# Patient Record
Sex: Male | Born: 1945
Health system: Southern US, Community
[De-identification: ages and names within clinical notes are randomized; demographics above are authoritative.]

## PROBLEM LIST (undated history)

## (undated) DIAGNOSIS — I2699 Other pulmonary embolism without acute cor pulmonale: Secondary | ICD-10-CM

## (undated) DIAGNOSIS — Z87438 Personal history of other diseases of male genital organs: Secondary | ICD-10-CM

## (undated) DIAGNOSIS — R011 Cardiac murmur, unspecified: Secondary | ICD-10-CM

## (undated) DIAGNOSIS — K635 Polyp of colon: Secondary | ICD-10-CM

## (undated) DIAGNOSIS — Z973 Presence of spectacles and contact lenses: Secondary | ICD-10-CM

## (undated) DIAGNOSIS — J189 Pneumonia, unspecified organism: Secondary | ICD-10-CM

## (undated) DIAGNOSIS — I4892 Unspecified atrial flutter: Secondary | ICD-10-CM

## (undated) DIAGNOSIS — E785 Hyperlipidemia, unspecified: Secondary | ICD-10-CM

## (undated) DIAGNOSIS — C61 Malignant neoplasm of prostate: Secondary | ICD-10-CM

## (undated) DIAGNOSIS — I82409 Acute embolism and thrombosis of unspecified deep veins of unspecified lower extremity: Secondary | ICD-10-CM

## (undated) DIAGNOSIS — M1990 Unspecified osteoarthritis, unspecified site: Secondary | ICD-10-CM

## (undated) DIAGNOSIS — I7789 Other specified disorders of arteries and arterioles: Secondary | ICD-10-CM

## (undated) DIAGNOSIS — I499 Cardiac arrhythmia, unspecified: Secondary | ICD-10-CM

## (undated) DIAGNOSIS — I4819 Other persistent atrial fibrillation: Secondary | ICD-10-CM

## (undated) DIAGNOSIS — I517 Cardiomegaly: Secondary | ICD-10-CM

## (undated) HISTORY — DX: Other pulmonary embolism without acute cor pulmonale: I26.99

## (undated) HISTORY — PX: TONSILLECTOMY: SUR1361

## (undated) HISTORY — DX: Unspecified osteoarthritis, unspecified site: M19.90

## (undated) HISTORY — DX: Acute embolism and thrombosis of unspecified deep veins of unspecified lower extremity: I82.409

## (undated) HISTORY — DX: Cardiomegaly: I51.7

## (undated) HISTORY — DX: Unspecified atrial flutter: I48.92

## (undated) HISTORY — PX: KNEE SURGERY: SHX244

## (undated) HISTORY — PX: QUADRICEPS REPAIR: SHX2281

## (undated) HISTORY — DX: Personal history of other diseases of male genital organs: Z87.438

## (undated) HISTORY — DX: Other specified disorders of arteries and arterioles: I77.89

## (undated) HISTORY — DX: Hyperlipidemia, unspecified: E78.5

## (undated) HISTORY — DX: Polyp of colon: K63.5

## (undated) HISTORY — PX: FRACTURE SURGERY: SHX138

## (undated) HISTORY — DX: Other persistent atrial fibrillation: I48.19

---

## 1998-03-31 ENCOUNTER — Ambulatory Visit (HOSPITAL_COMMUNITY): Admission: RE | Admit: 1998-03-31 | Discharge: 1998-03-31 | Payer: Self-pay | Admitting: *Deleted

## 1998-04-19 ENCOUNTER — Encounter: Payer: Self-pay | Admitting: Orthopedic Surgery

## 1998-04-19 ENCOUNTER — Emergency Department (HOSPITAL_COMMUNITY): Admission: EM | Admit: 1998-04-19 | Discharge: 1998-04-19 | Payer: Self-pay | Admitting: Emergency Medicine

## 2001-03-08 ENCOUNTER — Ambulatory Visit (HOSPITAL_COMMUNITY): Admission: RE | Admit: 2001-03-08 | Discharge: 2001-03-08 | Payer: Self-pay | Admitting: *Deleted

## 2001-03-08 ENCOUNTER — Encounter (INDEPENDENT_AMBULATORY_CARE_PROVIDER_SITE_OTHER): Payer: Self-pay | Admitting: *Deleted

## 2004-03-25 ENCOUNTER — Ambulatory Visit (HOSPITAL_COMMUNITY): Admission: RE | Admit: 2004-03-25 | Discharge: 2004-03-25 | Payer: Self-pay | Admitting: *Deleted

## 2004-03-25 ENCOUNTER — Encounter (INDEPENDENT_AMBULATORY_CARE_PROVIDER_SITE_OTHER): Payer: Self-pay | Admitting: *Deleted

## 2008-08-08 ENCOUNTER — Encounter: Payer: Self-pay | Admitting: Cardiology

## 2008-09-19 ENCOUNTER — Encounter: Payer: Self-pay | Admitting: Internal Medicine

## 2008-09-25 ENCOUNTER — Ambulatory Visit: Payer: Self-pay | Admitting: Internal Medicine

## 2008-09-25 DIAGNOSIS — D126 Benign neoplasm of colon, unspecified: Secondary | ICD-10-CM

## 2008-09-25 DIAGNOSIS — Z87898 Personal history of other specified conditions: Secondary | ICD-10-CM

## 2008-09-25 DIAGNOSIS — E785 Hyperlipidemia, unspecified: Secondary | ICD-10-CM | POA: Insufficient documentation

## 2008-09-25 DIAGNOSIS — M109 Gout, unspecified: Secondary | ICD-10-CM

## 2008-09-29 ENCOUNTER — Encounter: Payer: Self-pay | Admitting: Internal Medicine

## 2008-10-06 ENCOUNTER — Encounter: Payer: Self-pay | Admitting: Internal Medicine

## 2008-10-09 ENCOUNTER — Ambulatory Visit: Payer: Self-pay

## 2008-10-09 ENCOUNTER — Encounter: Payer: Self-pay | Admitting: Internal Medicine

## 2008-10-13 ENCOUNTER — Encounter: Payer: Self-pay | Admitting: Internal Medicine

## 2008-10-15 ENCOUNTER — Telehealth: Payer: Self-pay | Admitting: Internal Medicine

## 2008-10-22 ENCOUNTER — Encounter: Payer: Self-pay | Admitting: Internal Medicine

## 2008-11-03 ENCOUNTER — Encounter: Payer: Self-pay | Admitting: Internal Medicine

## 2008-11-06 ENCOUNTER — Ambulatory Visit: Payer: Self-pay | Admitting: Internal Medicine

## 2008-11-11 ENCOUNTER — Encounter: Payer: Self-pay | Admitting: Internal Medicine

## 2008-11-18 ENCOUNTER — Encounter: Payer: Self-pay | Admitting: Internal Medicine

## 2008-11-25 ENCOUNTER — Encounter: Payer: Self-pay | Admitting: Internal Medicine

## 2008-12-02 ENCOUNTER — Encounter: Payer: Self-pay | Admitting: Internal Medicine

## 2008-12-09 ENCOUNTER — Encounter: Payer: Self-pay | Admitting: Internal Medicine

## 2008-12-09 ENCOUNTER — Telehealth: Payer: Self-pay | Admitting: Internal Medicine

## 2008-12-10 ENCOUNTER — Telehealth: Payer: Self-pay | Admitting: Internal Medicine

## 2008-12-12 ENCOUNTER — Ambulatory Visit: Payer: Self-pay | Admitting: Internal Medicine

## 2008-12-12 DIAGNOSIS — I4821 Permanent atrial fibrillation: Secondary | ICD-10-CM | POA: Insufficient documentation

## 2008-12-12 LAB — CONVERTED CEMR LAB
BUN: 19 mg/dL (ref 6–23)
Basophils Absolute: 0 10*3/uL (ref 0.0–0.1)
Basophils Relative: 0.2 % (ref 0.0–3.0)
CO2: 28 meq/L (ref 19–32)
Calcium: 9.4 mg/dL (ref 8.4–10.5)
Chloride: 107 meq/L (ref 96–112)
Creatinine, Ser: 0.9 mg/dL (ref 0.4–1.5)
Eosinophils Absolute: 0.2 10*3/uL (ref 0.0–0.7)
Eosinophils Relative: 2.8 % (ref 0.0–5.0)
GFR calc non Af Amer: 90.62 mL/min (ref >60)
Glucose, Bld: 77 mg/dL (ref 70–99)
HCT: 41.8 % (ref 39.0–52.0)
Hemoglobin: 14.4 g/dL (ref 13.0–17.0)
INR: 2.2 — ABNORMAL HIGH (ref 0.8–1.0)
Lymphocytes Relative: 31.7 % (ref 12.0–46.0)
Lymphs Abs: 1.9 10*3/uL (ref 0.7–4.0)
MCHC: 34.4 g/dL (ref 30.0–36.0)
MCV: 102.5 fL — ABNORMAL HIGH (ref 78.0–100.0)
Magnesium: 2.1 mg/dL (ref 1.5–2.5)
Monocytes Absolute: 0.6 10*3/uL (ref 0.1–1.0)
Monocytes Relative: 9.5 % (ref 3.0–12.0)
Neutro Abs: 3.4 10*3/uL (ref 1.4–7.7)
Neutrophils Relative %: 55.8 % (ref 43.0–77.0)
Platelets: 201 10*3/uL (ref 150.0–400.0)
Potassium: 4.3 meq/L (ref 3.5–5.1)
Prothrombin Time: 22.2 s — ABNORMAL HIGH (ref 9.1–11.7)
RBC: 4.07 M/uL — ABNORMAL LOW (ref 4.22–5.81)
RDW: 12.2 % (ref 11.5–14.6)
Sodium: 140 meq/L (ref 135–145)
WBC: 6.1 10*3/uL (ref 4.5–10.5)
aPTT: 34.7 s — ABNORMAL HIGH (ref 21.7–28.8)

## 2008-12-16 ENCOUNTER — Ambulatory Visit (HOSPITAL_COMMUNITY): Admission: RE | Admit: 2008-12-16 | Discharge: 2008-12-16 | Payer: Self-pay | Admitting: Internal Medicine

## 2008-12-16 ENCOUNTER — Ambulatory Visit: Payer: Self-pay | Admitting: Internal Medicine

## 2008-12-19 ENCOUNTER — Telehealth: Payer: Self-pay | Admitting: Internal Medicine

## 2009-01-06 ENCOUNTER — Encounter (INDEPENDENT_AMBULATORY_CARE_PROVIDER_SITE_OTHER): Payer: Self-pay | Admitting: *Deleted

## 2009-01-07 ENCOUNTER — Ambulatory Visit: Payer: Self-pay | Admitting: Internal Medicine

## 2009-01-13 ENCOUNTER — Telehealth: Payer: Self-pay | Admitting: Internal Medicine

## 2009-02-24 ENCOUNTER — Telehealth (INDEPENDENT_AMBULATORY_CARE_PROVIDER_SITE_OTHER): Payer: Self-pay | Admitting: *Deleted

## 2009-09-21 ENCOUNTER — Encounter: Admission: RE | Admit: 2009-09-21 | Discharge: 2009-09-21 | Payer: Self-pay | Admitting: Family Medicine

## 2009-09-30 ENCOUNTER — Telehealth: Payer: Self-pay | Admitting: Internal Medicine

## 2009-10-08 ENCOUNTER — Encounter: Admission: RE | Admit: 2009-10-08 | Discharge: 2009-10-08 | Payer: Self-pay | Admitting: Family Medicine

## 2010-05-18 NOTE — Progress Notes (Signed)
Summary: talk to Dr Johney Frame  Phone Note From Other Clinic   Caller: Provider Summary of Call: Per Dr Duaine Dredge please call about this pt. he is aware that Dr Johney Frame out until next week. pt has atrial flutter being treated medically. wants to discuss the ablation. wants to know what the risk is for a stroke if he doesnt do it.  [phone] or [phone] private line. after hours [phone].  Initial call taken by: Edman Circle,  September 30, 2009 1:06 PM  Follow-up for Phone Call        I would be happy to have Mr Rachal come back by for further discussion.  His risks for stroke may be 3% per year at this time.  This would be reduced with ablation. Follow-up by: Hillis Range, MD,  October 05, 2009 10:30 PM  Additional Follow-up for Phone Call Additional follow up Details #1::        The doctor wants you to call him  Dr Duaine Dredge Dennis Bast, RN, BSN  October 06, 2009 12:31 PM Additional Follow-up by: Hillis Range, MD,  October 08, 2009 6:07 PM    Additional Follow-up for Phone Call Additional follow up Details #2::    I left a voicemail for Dr Duaine Dredge.   Hillis Range, MD  October 08, 2009 6:07 PM    I called again today and spoke with Dr Geoffery Lyons nurse.  He is apparhently out of town. She will notify him that I have called.  I left my cell phone number with her. Hillis Range, MD  October 12, 2009 3:25 PM   Additional Follow-up for Phone Call Additional follow up Details #3:: Details for Additional Follow-up Action Taken: I spoke with Dr Duaine Dredge today.  I estimate that the patient's annual risk for stroke is 3%.  With aspirin, this is reduced, but still 1-2% per year.  I would not recommend pradaxa or coumadin at this time as pts risks for bleeding would also approach 3% per year. He will discuss catheter ablation again with the patient.  If the patient wishes to proceed, I will be happy to discuss this with him further.

## 2010-05-25 ENCOUNTER — Telehealth: Payer: Self-pay | Admitting: Internal Medicine

## 2010-06-03 NOTE — Progress Notes (Signed)
Summary: Question about f/u appt  Phone Note Call from Patient Call back at Work Phone [phone]   Caller: Patient Summary of Call: Pt request call  Follow-up for Phone Call        only follow up as needed per Dr Johney Frame pt aware  to call if he as symptoms Dennis Bast, RN, BSN  May 25, 2010 11:21 AM

## 2010-07-24 LAB — PROTIME-INR
INR: 2 — ABNORMAL HIGH (ref 0.00–1.49)
Prothrombin Time: 22.5 seconds — ABNORMAL HIGH (ref 11.6–15.2)

## 2010-08-31 NOTE — Op Note (Signed)
NAMELAVAR, ROSENZWEIG                  ACCOUNT NO.:  [id number]   MEDICAL RECORD NO.:  [id number]          PATIENT TYPE:  OIB   LOCATION:  2899                         FACILITY:  [facility]   PHYSICIAN:  Hillis Range, MD       DATE OF BIRTH:  Sep 15, 1945   DATE OF PROCEDURE:  DATE OF DISCHARGE:  12/16/2008                               OPERATIVE REPORT   SURGEON:  Hillis Range, MD   PREPROCEDURE DIAGNOSIS:  Typical-appearing atrial flutter.   POSTPROCEDURE DIAGNOSES:  Typical-appearing atrial flutter.   PROCEDURES:  Elective cardioversion.   INTRODUCTION:  Mr. Herrington is a pleasant 65 year old gentleman who presents  for further evaluation of symptomatic atrial flutter.  He was recently  evaluated by his primary care physician and found to have typical-  appearing atrial flutter.  He was initiated on Coumadin for stroke  prevention.  He now presents for cardioversion.   DESCRIPTION OF PROCEDURE:  Informed written consent was obtained and the  patient was brought to the short-stay area.  He was adequately sedated  with intravenous propofol as outlined in the Anesthesia report.  The  patient was noted to be in typical-appearing atrial flutter with 4:1  conduction on surface EKG.  He was successfully cardioverted to sinus  rhythm with a single synchronized 200-joule biphasic shock with  cardioversion electrodes in the anterior or posterior thoracic  configuration.  He remained in sinus rhythm thereafter.  There were no  early apparent complications.   CONCLUSIONS:  1. Typical-appearing atrial flutter.  2. Successful cardioversion to sinus rhythm.  3. No early apparent complications.      Hillis Range, MD  Electronically Signed     JA/MEDQ  D:  12/16/2008  T:  12/17/2008  Job:  [id number]   cc:   Mosetta Putt, M.D.

## 2011-01-21 ENCOUNTER — Other Ambulatory Visit: Payer: Self-pay | Admitting: Family Medicine

## 2011-01-21 ENCOUNTER — Ambulatory Visit
Admission: RE | Admit: 2011-01-21 | Discharge: 2011-01-21 | Disposition: A | Discharge status: Home / Self Care | Payer: Medicare Other | Source: Ambulatory Visit | Attending: Family Medicine | Admitting: Family Medicine

## 2011-01-21 DIAGNOSIS — M25569 Pain in unspecified knee: Secondary | ICD-10-CM

## 2011-02-11 ENCOUNTER — Encounter: Payer: Self-pay | Admitting: Internal Medicine

## 2011-02-14 ENCOUNTER — Ambulatory Visit (INDEPENDENT_AMBULATORY_CARE_PROVIDER_SITE_OTHER): Payer: Medicare Other | Admitting: Internal Medicine

## 2011-02-14 ENCOUNTER — Encounter: Payer: Self-pay | Admitting: Internal Medicine

## 2011-02-14 DIAGNOSIS — I4892 Unspecified atrial flutter: Secondary | ICD-10-CM

## 2011-02-14 DIAGNOSIS — I4891 Unspecified atrial fibrillation: Secondary | ICD-10-CM

## 2011-02-14 NOTE — Assessment & Plan Note (Signed)
Mr Zachary Sanchez atrial flutter has degenerated into atrial fibrillation.  He remains asymptomatic and rate controlled.  His CHADSVASC score is 1 (age 65).  His emoblic risk is therefore very low.  We will continue ASA, though guidelines at this time would support no anticoagulation for him. We discussed rate vs rhythm control.  At this time, he will continue rate control.  I will obtain an echo to evaluate for any structural changes.  If no significant structural changes, then we will continue our present strategy and I will see him again in 12 months.

## 2011-02-14 NOTE — Patient Instructions (Signed)

## 2011-02-14 NOTE — Progress Notes (Signed)
The patient presents today for routine electrophysiology followup.  Since last being seen in our clinic, the patient reports doing very well.  He remains quite active and continues to ride his bike, play golf, and participate in vigorous activity.  Today, he denies symptoms of palpitations, chest pain, shortness of breath, orthopnea, PND, lower extremity edema, dizziness, presyncope, syncope, or neurologic sequela.  The patient feels that he is tolerating medications without difficulties and is otherwise without complaint today.   Past Medical History  Diagnosis Date  . Colonic polyp   . History of benign prostatic hypertrophy   . Hyperlipidemia   . Gout   . Atrial flutter   . Atrial fibrillation   . DJD (degenerative joint disease)    Past Surgical History  Procedure Date  . Knee surgery     right    Current Outpatient Prescriptions  Medication Sig Dispense Refill  . allopurinol (ZYLOPRIM) 300 MG tablet Take 300 mg by mouth daily.        . sildenafil (VIAGRA) 50 MG tablet Take 50 mg by mouth daily as needed.        . simvastatin (ZOCOR) 40 MG tablet Take 40 mg by mouth at bedtime.          No Known Allergies  History   Social History  . Marital Status: Married    Spouse Name: N/A    Number of Children: N/A  . Years of Education: N/A   Occupational History  . Financial Planning    Social History Main Topics  . Smoking status: Former Smoker    Quit date: 02/11/1979  . Smokeless tobacco: Not on file  . Alcohol Use: 12.6 oz/week    21 Glasses of wine per week     2-3 glasses of wine daily  . Drug Use: No  . Sexually Active: Not on file   Other Topics Concern  . Not on file   Social History Narrative  . No narrative on file    Family History  Problem Relation Age of Onset  . Heart attack Father   . Alcohol abuse Father     Heavy smoker and drinker   Physical Exam: Filed Vitals:   02/14/11 1608  BP: 129/87  Pulse: 70  Height: 6\' 3"  (1.905 m)  Weight: 218  lb (98.884 kg)    GEN- The patient is well appearing, alert and oriented x 3 today.   Head- normocephalic, atraumatic Eyes-  Sclera clear, conjunctiva pink Ears- hearing intact Oropharynx- clear Neck- supple, no JVP Lymph- no cervical lymphadenopathy Lungs- Clear to ausculation bilaterally, normal work of breathing Heart- irregular rate and rhythm, no murmurs, rubs or gallops, PMI not laterally displaced GI- soft, NT, ND, + BS Extremities- no clubbing, cyanosis, or edema MS- no significant deformity or atrophy Skin- no rash or lesion Psych- euthymic mood, full affect Neuro- strength and sensation are intact  ekg today reveals atrial fibrillation, V rate 66 bpm, otherwise normal ekg  Assessment and Plan:

## 2011-02-17 ENCOUNTER — Ambulatory Visit (HOSPITAL_COMMUNITY): Payer: Medicare Other | Attending: Internal Medicine

## 2011-02-17 DIAGNOSIS — I379 Nonrheumatic pulmonary valve disorder, unspecified: Secondary | ICD-10-CM | POA: Insufficient documentation

## 2011-02-17 DIAGNOSIS — I4892 Unspecified atrial flutter: Secondary | ICD-10-CM

## 2011-02-17 DIAGNOSIS — I079 Rheumatic tricuspid valve disease, unspecified: Secondary | ICD-10-CM | POA: Insufficient documentation

## 2011-02-17 DIAGNOSIS — I4891 Unspecified atrial fibrillation: Secondary | ICD-10-CM

## 2011-02-17 DIAGNOSIS — E785 Hyperlipidemia, unspecified: Secondary | ICD-10-CM | POA: Insufficient documentation

## 2011-02-17 DIAGNOSIS — I319 Disease of pericardium, unspecified: Secondary | ICD-10-CM | POA: Insufficient documentation

## 2011-02-17 DIAGNOSIS — I059 Rheumatic mitral valve disease, unspecified: Secondary | ICD-10-CM | POA: Insufficient documentation

## 2011-02-24 ENCOUNTER — Telehealth: Payer: Self-pay | Admitting: Internal Medicine

## 2011-02-24 NOTE — Telephone Encounter (Signed)
Informed patient of results/KLanier,RN  

## 2011-02-24 NOTE — Telephone Encounter (Signed)
Pt wants echo results

## 2011-03-29 ENCOUNTER — Telehealth: Payer: Self-pay | Admitting: Internal Medicine

## 2011-03-29 NOTE — Telephone Encounter (Signed)
New Msg: pt calling interested in a new medication,  Xarelto, wanting to know if this medication could benefit him. Please return pt call to discuss further.

## 2011-03-29 NOTE — Telephone Encounter (Signed)
lmom for patient that with A [place] score of 1 there is not a benefit for this medication  I reviewed Dr Jenel Lucks note and he says he does not need anticoagulation  Will forward to him for further review

## 2011-03-30 NOTE — Telephone Encounter (Signed)
CHADS2 score is 0. CHADSVASC is 1.  I would not recommend Xarelto at this time, but will re-evaluate upon return.

## 2012-02-23 ENCOUNTER — Ambulatory Visit (INDEPENDENT_AMBULATORY_CARE_PROVIDER_SITE_OTHER): Payer: Medicare Other | Admitting: Internal Medicine

## 2012-02-23 ENCOUNTER — Encounter: Payer: Self-pay | Admitting: Internal Medicine

## 2012-02-23 VITALS — BP 98/64 | HR 56 | Ht 75.0 in | Wt 218.0 lb

## 2012-02-23 DIAGNOSIS — I4891 Unspecified atrial fibrillation: Secondary | ICD-10-CM

## 2012-02-23 DIAGNOSIS — E785 Hyperlipidemia, unspecified: Secondary | ICD-10-CM

## 2012-02-23 NOTE — Assessment & Plan Note (Signed)
Stable I will ask Dr Duaine Dredge to forward labs to me

## 2012-02-23 NOTE — Assessment & Plan Note (Signed)
Mr Mcglory has longstanding persistent atrial fibrillation.  His exercise tolerance is preserved.  He remains asymptomatic and rate controlled.  His CHADSVASC score is 1 (age 66).  This does not warrant anticoagulation at this time as per our current guidelines. I repeat an echo to evaluate for any structural changes.  If no significant structural changes, then we will continue our present strategy and I will see him in 12 months.

## 2012-02-23 NOTE — Progress Notes (Signed)
PCP: Carolyne Fiscal, MD  The patient presents today for routine electrophysiology followup.  Since last being seen in our clinic, the patient reports doing very well.  He remains quite active and continues to ride his bike, play golf, and participate in vigorous activity.  This has not changed over the past year.  Today, he denies symptoms of palpitations, chest pain, shortness of breath, orthopnea, PND, lower extremity edema, dizziness, presyncope, syncope, or neurologic sequela.  The patient feels that he is tolerating medications without difficulties and is otherwise without complaint today.   Past Medical History  Diagnosis Date  . Colonic polyp   . History of benign prostatic hypertrophy   . Hyperlipidemia   . Gout   . Atrial flutter   . Persistent atrial fibrillation     longstanding persistent, asymptomatic  . DJD (degenerative joint disease)   . Biatrial enlargement    Past Surgical History  Procedure Date  . Knee surgery     right    Current Outpatient Prescriptions  Medication Sig Dispense Refill  . allopurinol (ZYLOPRIM) 300 MG tablet Take 300 mg by mouth daily.        . sildenafil (VIAGRA) 50 MG tablet Take 50 mg by mouth daily as needed.        . simvastatin (ZOCOR) 40 MG tablet Take 40 mg by mouth at bedtime.          No Known Allergies  History   Social History  . Marital Status: Married    Spouse Name: N/A    Number of Children: N/A  . Years of Education: N/A   Occupational History  . Financial Planning    Social History Main Topics  . Smoking status: Former Smoker    Quit date: 02/11/1979  . Smokeless tobacco: Not on file  . Alcohol Use: 12.6 oz/week    21 Glasses of wine per week     Comment: 2-3 glasses of wine daily  . Drug Use: No  . Sexually Active: Not on file   Other Topics Concern  . Not on file   Social History Narrative  . No narrative on file    Family History  Problem Relation Age of Onset  . Heart attack Father   . Alcohol  abuse Father     Heavy smoker and drinker   Physical Exam: Filed Vitals:   02/23/12 0930  BP: 98/64  Pulse: 56  Height: 6\' 3"  (1.905 m)  Weight: 218 lb (98.884 kg)  SpO2: 98%    GEN- The patient is well appearing, alert and oriented x 3 today.   Head- normocephalic, atraumatic Eyes-  Sclera clear, conjunctiva pink Ears- hearing intact Oropharynx- clear Neck- supple, no JVP Lymph- no cervical lymphadenopathy Lungs- Clear to ausculation bilaterally, normal work of breathing Heart- irregular rate and rhythm, no murmurs, rubs or gallops, PMI not laterally displaced GI- soft, NT, ND, + BS Extremities- no clubbing, cyanosis, or edema Neuro- strength and sensation are intact  ekg today reveals atrial fibrillation, V rate 56 bpm, otherwise normal ekg  Assessment and Plan:

## 2012-02-23 NOTE — Patient Instructions (Signed)
Your physician wants you to follow-up in: 12 months with Dr Allred You will receive a reminder letter in the mail two months in advance. If you don't receive a letter, please call our office to schedule the follow-up appointment.   Your physician has requested that you have an echocardiogram. Echocardiography is a painless test that uses sound waves to create images of your heart. It provides your doctor with information about the size and shape of your heart and how well your heart's chambers and valves are working. This procedure takes approximately one hour. There are no restrictions for this procedure.     

## 2012-02-28 ENCOUNTER — Ambulatory Visit (HOSPITAL_COMMUNITY): Payer: Medicare Other | Attending: Cardiology | Admitting: Radiology

## 2012-02-28 DIAGNOSIS — I4892 Unspecified atrial flutter: Secondary | ICD-10-CM

## 2012-02-28 DIAGNOSIS — I4891 Unspecified atrial fibrillation: Secondary | ICD-10-CM | POA: Insufficient documentation

## 2012-02-28 NOTE — Progress Notes (Signed)
Echocardiogram performed.  

## 2012-03-12 ENCOUNTER — Telehealth: Payer: Self-pay | Admitting: Internal Medicine

## 2012-03-12 NOTE — Telephone Encounter (Signed)
Pt calling for echo results  

## 2012-03-12 NOTE — Telephone Encounter (Signed)
Informed patient of results/KLanier,RN  

## 2012-11-20 ENCOUNTER — Other Ambulatory Visit: Payer: Self-pay | Admitting: Family Medicine

## 2012-11-20 ENCOUNTER — Ambulatory Visit
Admission: RE | Admit: 2012-11-20 | Discharge: 2012-11-20 | Disposition: A | Discharge status: Home / Self Care | Payer: Medicare Other | Source: Ambulatory Visit | Attending: Family Medicine | Admitting: Family Medicine

## 2012-11-20 DIAGNOSIS — R609 Edema, unspecified: Secondary | ICD-10-CM

## 2013-02-25 ENCOUNTER — Ambulatory Visit (INDEPENDENT_AMBULATORY_CARE_PROVIDER_SITE_OTHER): Payer: Medicare Other | Admitting: Internal Medicine

## 2013-02-25 ENCOUNTER — Encounter: Payer: Self-pay | Admitting: Internal Medicine

## 2013-02-25 VITALS — BP 118/82 | HR 60 | Ht 74.5 in | Wt 216.4 lb

## 2013-02-25 DIAGNOSIS — I7789 Other specified disorders of arteries and arterioles: Secondary | ICD-10-CM | POA: Insufficient documentation

## 2013-02-25 DIAGNOSIS — I4891 Unspecified atrial fibrillation: Secondary | ICD-10-CM

## 2013-02-25 NOTE — Progress Notes (Signed)
PCP: Carolyne Fiscal, MD  The patient presents today for routine electrophysiology followup.  Since last being seen in our clinic, the patient reports doing very well.  He remains quite active and continues to ride his bike, play golf, and participate in vigorous activity.  He enjoys [place] mountains.  He remains asymptomatic with afib.  Today, he denies symptoms of palpitations, chest pain, shortness of breath, orthopnea, PND, lower extremity edema, dizziness, presyncope, syncope, or neurologic sequela.  The patient feels that he is tolerating medications without difficulties and is otherwise without complaint today.   Past Medical History  Diagnosis Date  . Colonic polyp   . History of benign prostatic hypertrophy   . Hyperlipidemia   . Gout   . Atrial flutter   . Persistent atrial fibrillation     longstanding persistent, asymptomatic  . DJD (degenerative joint disease)   . Biatrial enlargement   . Aortic root enlargement     aortic root 40mm in size   Past Surgical History  Procedure Laterality Date  . Knee surgery      right    Current Outpatient Prescriptions  Medication Sig Dispense Refill  . allopurinol (ZYLOPRIM) 300 MG tablet Take 300 mg by mouth daily.        . finasteride (PROSCAR) 5 MG tablet Take 1 tablet by mouth daily.      . sildenafil (VIAGRA) 50 MG tablet Take 50 mg by mouth daily as needed.        . simvastatin (ZOCOR) 40 MG tablet Take 40 mg by mouth at bedtime.         No current facility-administered medications for this visit.    No Known Allergies  History   Social History  . Marital Status: Married    Spouse Name: N/A    Number of Children: N/A  . Years of Education: N/A   Occupational History  . Financial Planning    Social History Main Topics  . Smoking status: Former Smoker    Quit date: 02/11/1979  . Smokeless tobacco: Not on file  . Alcohol Use: 12.6 oz/week    21 Glasses of wine per week     Comment: 2-3 glasses  of wine daily  . Drug Use: No  . Sexual Activity: Not on file   Other Topics Concern  . Not on file   Social History Narrative  . No narrative on file    Family History  Problem Relation Age of Onset  . Heart attack Father   . Alcohol abuse Father     Heavy smoker and drinker   Physical Exam: Filed Vitals:   02/25/13 0918  BP: 118/82  Pulse: 60  Height: 6' 2.5" (1.892 m)  Weight: 216 lb 6.4 oz (98.158 kg)    GEN- The patient is well appearing, alert and oriented x 3 today.   Head- normocephalic, atraumatic Eyes-  Sclera clear, conjunctiva pink Ears- hearing intact Oropharynx- clear Neck- supple, no JVP Lymph- no cervical lymphadenopathy Lungs- Clear to ausculation bilaterally, normal work of breathing Heart- irregular rate and rhythm, no murmurs, rubs or gallops, PMI not laterally displaced GI- soft, NT, ND, + BS Extremities- no clubbing, cyanosis, or edema Neuro- strength and sensation are intact  ekg today reveals atrial fibrillation, V rate 52 bpm, otherwise normal ekg  Assessment and Plan:  1. Longstanding persistent afib Rate controlled CHADS2VASC score is 1.  He will continue ASA at this time.  2. Mildly enlarged aortic root Repeat echo  Return in 1 year ?

## 2013-02-25 NOTE — Patient Instructions (Signed)
Your physician wants you to follow-up in: 12 months with Dr Allred You will receive a reminder letter in the mail two months in advance. If you don't receive a letter, please call our office to schedule the follow-up appointment.   Your physician has requested that you have an echocardiogram. Echocardiography is a painless test that uses sound waves to create images of your heart. It provides your doctor with information about the size and shape of your heart and how well your heart's chambers and valves are working. This procedure takes approximately one hour. There are no restrictions for this procedure.     

## 2013-02-25 NOTE — Addendum Note (Signed)
Addended by: Dennis Bast F on: 02/25/2013 10:10 AM   Modules accepted: Orders

## 2013-03-11 ENCOUNTER — Ambulatory Visit (HOSPITAL_COMMUNITY): Payer: Medicare Other | Attending: Internal Medicine | Admitting: Radiology

## 2013-03-11 ENCOUNTER — Encounter: Payer: Self-pay | Admitting: Internal Medicine

## 2013-03-11 DIAGNOSIS — I7789 Other specified disorders of arteries and arterioles: Secondary | ICD-10-CM

## 2013-03-11 DIAGNOSIS — I77819 Aortic ectasia, unspecified site: Secondary | ICD-10-CM | POA: Insufficient documentation

## 2013-03-11 DIAGNOSIS — I079 Rheumatic tricuspid valve disease, unspecified: Secondary | ICD-10-CM | POA: Insufficient documentation

## 2013-03-11 DIAGNOSIS — I4891 Unspecified atrial fibrillation: Secondary | ICD-10-CM | POA: Insufficient documentation

## 2013-03-11 DIAGNOSIS — E785 Hyperlipidemia, unspecified: Secondary | ICD-10-CM | POA: Insufficient documentation

## 2013-03-11 NOTE — Progress Notes (Signed)
Echocardiogram performed.  

## 2015-04-16 ENCOUNTER — Other Ambulatory Visit: Payer: Self-pay | Admitting: Family Medicine

## 2015-04-16 ENCOUNTER — Ambulatory Visit
Admission: RE | Admit: 2015-04-16 | Discharge: 2015-04-16 | Disposition: A | Discharge status: Home / Self Care | Payer: Medicare Other | Source: Ambulatory Visit | Attending: Family Medicine | Admitting: Family Medicine

## 2015-04-16 DIAGNOSIS — J189 Pneumonia, unspecified organism: Secondary | ICD-10-CM

## 2015-06-03 ENCOUNTER — Encounter: Payer: Self-pay | Admitting: Radiation Oncology

## 2015-06-03 NOTE — Progress Notes (Signed)
GU Location of Tumor / Histology: prostatic adenocarcinoma  If Prostate Cancer, Gleason Score is (3 + 4) and PSA is (0.756)  Zachary Sanchez presented  months ago with signs/symptoms of:   Biopsies of prostate (if applicable) revealed:    Past/Anticipated interventions by urology, if any: finasteride x 5 years, abnormality discovered on DRE,  prostate biopsy, discussed surgery with Dr. Alinda Money, referral to Dr. Tammi Klippel for radiation oncology consult  Past/Anticipated interventions by medical oncology, if any: no  Weight changes, if any: no  Bowel/Bladder complaints, if any: urinary frequency, nocturia x2, weak stream   Nausea/Vomiting, if any: no  Pain issues, if any:  no  SAFETY ISSUES:  Prior radiation? no  Pacemaker/ICD? no  Possible current pregnancy? no  Is the patient on methotrexate? no  Current Complaints / other details:  70 year old male. Married. PROSTATE VOLUME:21.7 cc. Denies a family hx of prostate or breast ca. Reports dysuria or hematuria resolved shortly after prostate biopsy.

## 2015-06-08 ENCOUNTER — Encounter: Payer: Self-pay | Admitting: Radiation Oncology

## 2015-06-08 ENCOUNTER — Ambulatory Visit
Admission: RE | Admit: 2015-06-08 | Discharge: 2015-06-08 | Disposition: A | Discharge status: Home / Self Care | Payer: Medicare Other | Source: Ambulatory Visit | Attending: Radiation Oncology | Admitting: Radiation Oncology

## 2015-06-08 VITALS — BP 117/85 | HR 63 | Temp 98.0°F | Resp 16 | Ht 74.0 in | Wt 216.4 lb

## 2015-06-08 DIAGNOSIS — C61 Malignant neoplasm of prostate: Secondary | ICD-10-CM

## 2015-06-08 DIAGNOSIS — I481 Persistent atrial fibrillation: Secondary | ICD-10-CM | POA: Diagnosis not present

## 2015-06-08 DIAGNOSIS — I77819 Aortic ectasia, unspecified site: Secondary | ICD-10-CM | POA: Insufficient documentation

## 2015-06-08 DIAGNOSIS — Z86010 Personal history of colonic polyps: Secondary | ICD-10-CM | POA: Diagnosis not present

## 2015-06-08 DIAGNOSIS — Z87891 Personal history of nicotine dependence: Secondary | ICD-10-CM | POA: Diagnosis not present

## 2015-06-08 DIAGNOSIS — E785 Hyperlipidemia, unspecified: Secondary | ICD-10-CM | POA: Diagnosis not present

## 2015-06-08 DIAGNOSIS — I4892 Unspecified atrial flutter: Secondary | ICD-10-CM | POA: Insufficient documentation

## 2015-06-08 DIAGNOSIS — Z7982 Long term (current) use of aspirin: Secondary | ICD-10-CM | POA: Insufficient documentation

## 2015-06-08 HISTORY — DX: Malignant neoplasm of prostate: C61

## 2015-06-08 NOTE — Progress Notes (Signed)
See progress note under physician encounter. 

## 2015-06-08 NOTE — Progress Notes (Signed)
Radiation Oncology         [phone] ________________________________  Initial outpatient Consultation  Name: Zachary Sanchez MRN: [id number]  Date: 06/08/2015  DOB: 11-28-1945  JG:OTLXBWIO,MBTDH F, MD  Zachary Bring, MD   REFERRING PHYSICIAN: Raynelle Bring, MD  DIAGNOSIS: The encounter diagnosis was Malignant neoplasm of prostate [facility]).    ICD-9-CM ICD-10-CM   1. Malignant neoplasm of prostate ([place]) 185 C61     HISTORY OF PRESENT ILLNESS: Zachary Sanchez is a 70 y.o. male seen at the request of Dr.  Alinda Sanchez for a new adenocarcinoma of the prostate. Of note the patient was found to have a palpable nodule on a routine physical exam by his primary provider. His PSA at that time in October 2016 was 0.765. He met with Dr. Alinda Sanchez and underwent repeat exam which revealed a palpable nodule in the left lobe , and subsequently underwent prosthetic biopsy on 05/19/2015. Findings revealed a prosthetic volume of 21.7. Of the 12 cores, 6 contained adenocarcinoma with a Gleason score of 3+4. Due to the palpable lesion his tumors considered at T2b intermediate risk adenocarcinoma and he comes for further recommendations of care by Dr. Tammi Sanchez. He was already counseled on the role for surgery versus external radiation plus or -6 months of androgen deprivation therapy. It is of note that he has had benign prostatic hypertrophy with symptoms of increased urinary frequency and incomplete voiding. He has been on finasteride for 5 years without any significant relief of his symptoms, and he states that Flomax was previously used which also did not help. His IPSS cores 12 at baseline.  PREVIOUS RADIATION THERAPY: No  PAST MEDICAL HISTORY:  Past Medical History  Diagnosis Date  . Colonic polyp   . History of benign prostatic hypertrophy   . Hyperlipidemia   . Gout   . Atrial flutter ([place])   . Persistent atrial fibrillation (HCC)     longstanding persistent, asymptomatic  . DJD (degenerative joint disease)     . Biatrial enlargement   . Aortic root enlargement (HCC)     aortic root 70m in size  . Prostate cancer ([facility]       PAST SURGICAL HISTORY: Past Surgical History  Procedure Laterality Date  . Knee surgery      right    FAMILY HISTORY:  Family History  Problem Relation Age of Onset  . Heart attack Father   . Alcohol abuse Father     Heavy smoker and drinker  . Cancer Brother     base of tongue    SOCIAL HISTORY:  Social History   Social History  . Marital Status: Married    Spouse Name: N/A  . Number of Children: N/A  . Years of Education: N/A   Occupational History  . Financial Planning    Social History Main Topics  . Smoking status: Former Smoker    Quit date: 02/11/1979  . Smokeless tobacco: Never Used  . Alcohol Use: 12.6 oz/week    21 Glasses of wine per week     Comment: 2-3 glasses of wine daily  . Drug Use: No  . Sexual Activity: Not Currently   Other Topics Concern  . Not on file   Social History Narrative  The patient is married and [place] He owns a fPublishing rights manager He denies any tobacco use. He enjoys 2-3 alcoholic beverages daily.  ALLERGIES: Tamsulosin and Toviaz  MEDICATIONS:  Current Outpatient Prescriptions  Medication Sig Dispense Refill  . allopurinol (  ZYLOPRIM) 300 MG tablet Take 300 mg by mouth daily.      Marland Kitchen aspirin 81 MG tablet Take 325 mg by mouth daily.     . Cholecalciferol (VITAMIN D3) 10000 units TABS Take by mouth.    . finasteride (PROSCAR) 5 MG tablet Take 1 tablet by mouth daily.    . sildenafil (VIAGRA) 50 MG tablet Take 50 mg by mouth daily as needed.      . simvastatin (ZOCOR) 40 MG tablet Take 40 mg by mouth at bedtime.       No current facility-administered medications for this encounter.    REVIEW OF SYSTEMS:  On review of systems the patient reports that overall he is doing very well. He states that his main symptoms are urinary frequency, urgency, week stream, and difficulty emptying  his bladder. His IPSS score here in our office is 18. He states that he wakes up most nights several times to urinate. He has had peronie's disease and states that he has not had erectile function in the last 10 years despite treatments at [facility]. He states that he has not had any hematuria or dysuria, fevers, chills, nausea or vomiting. He denies any bowel dysfunction. He is not experiencing chest pain or shortness of breath. Please review of systems is obtained and is otherwise negative.   PHYSICAL EXAM:  height is _0  (1.88 m) and weight is 216 lb 6.4 oz (98.158 kg). His oral temperature is 98 F (36.7 C). His blood pressure is 117/85 and his pulse is 63. His respiration is 16 and oxygen saturation is 100%.   Pain Scale 0/10  In general this is a well-appearing Caucasian male in no acute distress. He is alert and oriented 4 and appropriate Examination. Cardiovascular exam reveals a regular rate and rhythm, no clicks rubs or murmurs are auscultated. Chest is clear to auscultation bilaterally. Lymphatic review is performed and does not reveal any palpable adenopathy of the supraclavicular, cervical, or axillary regions bilaterally. Abdomen is intact bowel sounds in all quadrants is soft, nontender, nondistended. Lower extremities are negative for pretibial pitting edema deep calf tenderness cyanosis or clubbing. Skin is intact without evidence of excoriations or lesion.  KPS = 100  100 - Normal; no complaints; no evidence of disease. 90   - Able to carry on normal activity; minor signs or symptoms of disease. 80   - Normal activity with effort; some signs or symptoms of disease. 44   - Cares for self; unable to carry on normal activity or to do active work. 60   - Requires occasional assistance, but is able to care for most of his personal needs. 50   - Requires considerable assistance and frequent medical care. 18   - Disabled; requires special care and assistance. 91   - Severely disabled;  hospital admission is indicated although death not imminent. 7   - Very sick; hospital admission necessary; active supportive treatment necessary. 10   - Moribund; fatal processes progressing rapidly. 0     - Dead  Karnofsky DA, Abelmann Naper, Craver LS and Burchenal [facility] [phone]) The use of the nitrogen mustards in the palliative treatment of carcinoma: with particular reference to bronchogenic carcinoma Cancer 1 [phone]  LABORATORY DATA:  Lab Results  Component Value Date   WBC 6.1 12/12/2008   HGB 14.4 12/12/2008   HCT 41.8 12/12/2008   MCV 102.5* 12/12/2008   PLT 201.0 12/12/2008   Lab Results  Component Value Date   NA 140  12/12/2008   K 4.3 12/12/2008   CL 107 12/12/2008   CO2 28 12/12/2008   No results found for: ALT, AST, GGT, ALKPHOS, BILITOT   RADIOGRAPHY: No results found.    IMPRESSION: 70 year old male with a Stage IC immediate risk adenocarcinoma of the prostate with a Gleason score 3+4 and a PSA of 0.765  PLAN: After reviewing the patient's staging, the natural course of prostate cancer, and his IPSS score reflecting his urinary symptoms, Dr.Manning discusses the options for radiotherapy given externally plus or minus ADT versus surgical intervention. Given the patient's significant urinary symptoms, he is interested in considering prostate surgery, and we discussed that we would be happy to meet back with him if there were any risk factors from surgery that place him at increased risk of needing additional radiotherapy. We did discuss the side effects of radiotherapy in detail with the patient, and at the end of the conversation he with all questions answered to his satisfaction. We appreciate the opportunity to participate in his care, and we'll be available on an as-needed basis to the patient.  The above documentation reflects my direct findings during this shared patient visit. Please see the separate note by Dr. Tammi Sanchez on this date for the remainder of the patient's  plan of care.  Carola Rhine, PAC

## 2015-06-09 ENCOUNTER — Other Ambulatory Visit: Payer: Self-pay | Admitting: Urology

## 2015-07-06 ENCOUNTER — Telehealth: Payer: Self-pay | Admitting: Internal Medicine

## 2015-07-06 ENCOUNTER — Encounter: Payer: Self-pay | Admitting: Internal Medicine

## 2015-07-06 ENCOUNTER — Encounter (HOSPITAL_COMMUNITY): Payer: Self-pay

## 2015-07-06 ENCOUNTER — Ambulatory Visit (INDEPENDENT_AMBULATORY_CARE_PROVIDER_SITE_OTHER): Payer: Medicare Other | Admitting: Internal Medicine

## 2015-07-06 ENCOUNTER — Encounter (HOSPITAL_COMMUNITY)
Admission: RE | Admit: 2015-07-06 | Discharge: 2015-07-06 | Disposition: A | Discharge status: Home / Self Care | Payer: Medicare Other | Source: Ambulatory Visit | Attending: Urology | Admitting: Urology

## 2015-07-06 ENCOUNTER — Ambulatory Visit (HOSPITAL_BASED_OUTPATIENT_CLINIC_OR_DEPARTMENT_OTHER): Payer: Medicare Other

## 2015-07-06 ENCOUNTER — Other Ambulatory Visit: Payer: Self-pay

## 2015-07-06 VITALS — BP 126/86 | HR 50 | Ht 74.0 in | Wt 219.8 lb

## 2015-07-06 DIAGNOSIS — I4891 Unspecified atrial fibrillation: Secondary | ICD-10-CM | POA: Diagnosis not present

## 2015-07-06 DIAGNOSIS — I7789 Other specified disorders of arteries and arterioles: Secondary | ICD-10-CM | POA: Diagnosis not present

## 2015-07-06 DIAGNOSIS — I480 Paroxysmal atrial fibrillation: Secondary | ICD-10-CM | POA: Diagnosis not present

## 2015-07-06 HISTORY — DX: Presence of spectacles and contact lenses: Z97.3

## 2015-07-06 HISTORY — DX: Cardiac arrhythmia, unspecified: I49.9

## 2015-07-06 HISTORY — DX: Cardiac murmur, unspecified: R01.1

## 2015-07-06 LAB — CBC
HEMATOCRIT: 44 % (ref 39.0–52.0)
HEMOGLOBIN: 14.5 g/dL (ref 13.0–17.0)
MCH: 33.6 pg (ref 26.0–34.0)
MCHC: 33 g/dL (ref 30.0–36.0)
MCV: 102.1 fL — ABNORMAL HIGH (ref 78.0–100.0)
Platelets: 211 10*3/uL (ref 150–400)
RBC: 4.31 MIL/uL (ref 4.22–5.81)
RDW: 13.2 % (ref 11.5–15.5)
WBC: 6.1 10*3/uL (ref 4.0–10.5)

## 2015-07-06 LAB — BASIC METABOLIC PANEL
ANION GAP: 9 (ref 5–15)
BUN: 19 mg/dL (ref 6–20)
CO2: 27 mmol/L (ref 22–32)
Calcium: 9.7 mg/dL (ref 8.9–10.3)
Chloride: 105 mmol/L (ref 101–111)
Creatinine, Ser: 0.94 mg/dL (ref 0.61–1.24)
GFR calc Af Amer: >60 mL/min (ref >60)
GLUCOSE: 103 mg/dL — AB (ref 65–99)
POTASSIUM: 4.6 mmol/L (ref 3.5–5.1)
Sodium: 141 mmol/L (ref 135–145)

## 2015-07-06 LAB — ABO/RH: ABO/RH(D): B POS

## 2015-07-06 LAB — ECHOCARDIOGRAM COMPLETE
Height: 74 in
WEIGHTICAEL: 3506 [oz_av]

## 2015-07-06 NOTE — Progress Notes (Signed)
PCP: Marylene Land, MD  The patient presents today for routine cardiology followup.  Since last being seen in our clinic, the patient reports doing very well.  He remains quite active and continues to ride his bike, play golf, and participate in vigorous activity.  He remains asymptomatic with afib.  He has been diagnosed with prostate cancer and requires surgery.  He is referred back to me today for preoperative clearance.  Today, he denies symptoms of palpitations, chest pain, shortness of breath, orthopnea, PND, lower extremity edema, dizziness, presyncope, syncope, or neurologic sequela.  The patient feels that he is tolerating medications without difficulties and is otherwise without complaint today.   Past Medical History  Diagnosis Date  . Colonic polyp   . History of benign prostatic hypertrophy   . Hyperlipidemia   . Gout   . Atrial flutter ([place])   . Persistent atrial fibrillation (HCC)     longstanding persistent, asymptomatic  . DJD (degenerative joint disease)   . Biatrial enlargement   . Aortic root enlargement (HCC)     aortic root 59mm in size  . Prostate cancer ([place])   . Dysrhythmia   . Heart murmur   . Wears glasses    Past Surgical History  Procedure Laterality Date  . Knee surgery      right; menicus tear   . Tonsillectomy    . Fracture surgery      ankles and fingers    Current Outpatient Prescriptions  Medication Sig Dispense Refill  . allopurinol (ZYLOPRIM) 300 MG tablet Take 300 mg by mouth daily.      Marland Kitchen aspirin 325 MG tablet Take 325 mg by mouth daily.    . Cholecalciferol (VITAMIN D3) 10000 units TABS Take 1,000 Units by mouth daily.     . finasteride (PROSCAR) 5 MG tablet Take by mouth daily.     . sildenafil (VIAGRA) 50 MG tablet Take 50 mg by mouth daily as needed for erectile dysfunction.     . simvastatin (ZOCOR) 40 MG tablet Take 40 mg by mouth at bedtime.       No current facility-administered medications for this visit.    Allergies   Allergen Reactions  . Tamsulosin Other (See Comments)    Intolerance  . Toviaz [Fesoterodine Fumarate Er] Other (See Comments)    Intolerance    Social History   Social History  . Marital Status: Married    Spouse Name: N/A  . Number of Children: N/A  . Years of Education: N/A   Occupational History  . Financial Planning    Social History Main Topics  . Smoking status: Former Smoker -- 1.00 packs/day for 20 years    Types: Cigarettes    Quit date: 02/11/1979  . Smokeless tobacco: Never Used  . Alcohol Use: 12.6 oz/week    21 Glasses of wine per week     Comment: 2-3 glasses of wine daily  . Drug Use: No  . Sexual Activity: Not Currently   Other Topics Concern  . Not on file   Social History Narrative    Family History  Problem Relation Age of Onset  . Heart attack Father   . Alcohol abuse Father     Heavy smoker and drinker  . Cancer Brother     base of tongue   Physical Exam: Filed Vitals:   07/06/15 1435  BP: 126/86  Pulse: 50  Height: 6\' 2"  (1.88 m)  Weight: 219 lb 12.8 oz (99.701 kg)  GEN- The patient is well appearing, alert and oriented x 3 today.   Head- normocephalic, atraumatic Eyes-  Sclera clear, conjunctiva pink Ears- hearing intact Oropharynx- clear Neck- supple  Lungs- Clear to ausculation bilaterally, normal work of breathing Heart- irregular rate and rhythm, no murmurs, rubs or gallops, PMI not laterally displaced GI- soft, NT, ND, + BS Extremities- no clubbing, cyanosis, or edema Neuro- strength and sensation are intact  ekg today reveals atrial fibrillation, V rate 50 bpm, otherwise normal ekg  Assessment and Plan:  1. Longstanding persistent afib Rate controlled CHADS2VASC score is 1.  He will continue ASA at this time.  2. Mildly enlarged aortic root Stable by echo  3. Mild to moderate MR Stable by echo asymptomatic  4. preop Ok to proceed with prostatectomy at this time Ok to hold ASA for the procedure  Return  in 2 years, repeat echo at that time  Thompson Grayer MD, [facility] 07/06/2015 2:58 PM

## 2015-07-06 NOTE — Addendum Note (Signed)
Addended by: Zebedee Iba on: 07/06/2015 03:19 PM   Modules accepted: Medications

## 2015-07-06 NOTE — Telephone Encounter (Signed)
Received call from scheduler.  Patient has been added at 2:15.  Will get echo at 1:00 prior to [place].  Pt aware to be here at 12:45

## 2015-07-06 NOTE — Patient Instructions (Signed)
Zachary Sanchez  07/06/2015   Your procedure is scheduled on: Thursday July 09, 2015  Report to [facility] Main  Entrance take [place]  elevators to 3rd floor to  [place] at 5:15 AM.  Call this number if you have problems the morning of surgery [phone]   Remember: ONLY 1 PERSON MAY GO WITH YOU TO SHORT STAY TO GET  READY MORNING OF [place].  Do not eat food or drink liquids :After Midnight.     Take these medicines the morning of surgery with A SIP OF WATER: Allopurinol; Finasteride (Proscar)                               You may not have any metal on your body including hair pins and              piercings  Do not wear jewelry lotions, powders or colognes, deodorant                         Men may shave face and neck.   Do not bring valuables to the hospital. [place].  Contacts, dentures or bridgework may not be worn into surgery.  Leave suitcase in the car. After surgery it may be brought to your room.      Special Instructions: FOLLOW SURGEON'S INSTRUCTION IN REGARDS TO BOWEL PREPARATION PRIOR TO SURGICAL PROCEDURE DATE               Please read over the following fact sheets you were given:INCENTIVE SPIROMETER; BLOOD TRANSFUSION INFORMATION SHEET  _____________________________________________________________________             [facility] - Preparing for Surgery Before surgery, you can play an important role.  Because skin is not sterile, your skin needs to be as free of germs as possible.  You can reduce the number of germs on your skin by washing with CHG (chlorahexidine gluconate) soap before surgery.  CHG is an antiseptic cleaner which kills germs and bonds with the skin to continue killing germs even after washing. Please DO NOT use if you have an allergy to CHG or antibacterial soaps.  If your skin becomes reddened/irritated stop using the CHG and inform your nurse when you arrive  at Short Stay. Do not shave (including legs and underarms) for at least 48 hours prior to the first CHG shower.  You may shave your face/neck. Please follow these instructions carefully:  1.  Shower with CHG Soap the night before surgery and the  morning of Surgery.  2.  If you choose to wash your hair, wash your hair first as usual with your  normal  shampoo.  3.  After you shampoo, rinse your hair and body thoroughly to remove the  shampoo.                           4.  Use CHG as you would any other liquid soap.  You can apply chg directly  to the skin and wash                       Gently with a scrungie  or clean washcloth.  5.  Apply the CHG Soap to your body ONLY FROM THE NECK DOWN.   Do not use on face/ open                           Wound or open sores. Avoid contact with eyes, ears mouth and genitals (private parts).                       Wash face,  Genitals (private parts) with your normal soap.             6.  Wash thoroughly, paying special attention to the area where your surgery  will be performed.  7.  Thoroughly rinse your body with warm water from the neck down.  8.  DO NOT shower/wash with your normal soap after using and rinsing off  the CHG Soap.                9.  Pat yourself dry with a clean towel.            10.  Wear clean pajamas.            11.  Place clean sheets on your bed the night of your first shower and do not  sleep with pets. Day of Surgery : Do not apply any lotions/deodorants the morning of surgery.  Please wear clean clothes to the hospital/surgery center.  FAILURE TO FOLLOW THESE INSTRUCTIONS MAY RESULT IN THE CANCELLATION OF YOUR SURGERY PATIENT SIGNATURE_________________________________  NURSE SIGNATURE__________________________________  ________________________________________________________________________   Adam Phenix  An incentive spirometer is a tool that can help keep your lungs clear and active. This tool measures how well you  are filling your lungs with each breath. Taking long deep breaths may help reverse or decrease the chance of developing breathing (pulmonary) problems (especially infection) following:  A long period of time when you are unable to move or be active. BEFORE THE PROCEDURE   If the spirometer includes an indicator to show your best effort, your nurse or respiratory therapist will set it to a desired goal.  If possible, sit up straight or lean slightly forward. Try not to slouch.  Hold the incentive spirometer in an upright position. INSTRUCTIONS FOR USE   Sit on the edge of your bed if possible, or sit up as far as you can in bed or on a chair.  Hold the incentive spirometer in an upright position.  Breathe out normally.  Place the mouthpiece in your mouth and seal your lips tightly around it.  Breathe in slowly and as deeply as possible, raising the piston or the ball toward the top of the column.  Hold your breath for 3-5 seconds or for as long as possible. Allow the piston or ball to fall to the bottom of the column.  Remove the mouthpiece from your mouth and breathe out normally.  Rest for a few seconds and repeat Steps 1 through 7 at least 10 times every 1-2 hours when you are awake. Take your time and take a few normal breaths between deep breaths.  The spirometer may include an indicator to show your best effort. Use the indicator as a goal to work toward during each repetition.  After each set of 10 deep breaths, practice coughing to be sure your lungs are clear. If you have an incision (the cut made at the time of surgery), support your incision when  coughing by placing a pillow or rolled up towels firmly against it. Once you are able to get out of bed, walk around indoors and cough well. You may stop using the incentive spirometer when instructed by your caregiver.  RISKS AND COMPLICATIONS  Take your time so you do not get dizzy or light-headed.  If you are in pain, you may  need to take or ask for pain medication before doing incentive spirometry. It is harder to take a deep breath if you are having pain. AFTER USE  Rest and breathe slowly and easily.  It can be helpful to keep track of a log of your progress. Your caregiver can provide you with a simple table to help with this. If you are using the spirometer at home, follow these instructions: [place] IF:   You are having difficultly using the spirometer.  You have trouble using the spirometer as often as instructed.  Your pain medication is not giving enough relief while using the spirometer.  You develop fever of 100.5 F (38.1 C) or higher. SEEK IMMEDIATE MEDICAL CARE IF:   You cough up bloody sputum that had not been present before.  You develop fever of 102 F (38.9 C) or greater.  You develop worsening pain at or near the incision site. MAKE SURE YOU:   Understand these instructions.  Will watch your condition.  Will get help right away if you are not doing well or get worse. Document Released: 08/15/2006 Document Revised: 06/27/2011 Document Reviewed: 10/16/2006 ExitCare Patient Information 2014 Exit[employer], [place].   ________________________________________________________________________  WHAT IS A BLOOD TRANSFUSION? Blood Transfusion Information  A transfusion is the replacement of blood or some of its parts. Blood is made up of multiple cells which provide different functions.  Red blood cells carry oxygen and are used for blood loss replacement.  White blood cells fight against infection.  Platelets control bleeding.  Plasma helps clot blood.  Other blood products are available for specialized needs, such as hemophilia or other clotting disorders. BEFORE THE TRANSFUSION  Who gives blood for transfusions?   Healthy volunteers who are fully evaluated to make sure their blood is safe. This is blood bank blood. Transfusion therapy is the safest it has ever been in  the practice of medicine. Before blood is taken from a donor, a complete history is taken to make sure that person has no history of diseases nor engages in risky social behavior (examples are intravenous drug use or sexual activity with multiple partners). The donor's travel history is screened to minimize risk of transmitting infections, such as malaria. The donated blood is tested for signs of infectious diseases, such as HIV and hepatitis. The blood is then tested to be sure it is compatible with you in order to minimize the chance of a transfusion reaction. If you or a relative donates blood, this is often done in anticipation of surgery and is not appropriate for emergency situations. It takes many days to process the donated blood. RISKS AND COMPLICATIONS Although transfusion therapy is very safe and saves many lives, the main dangers of transfusion include:   Getting an infectious disease.  Developing a transfusion reaction. This is an allergic reaction to something in the blood you were given. Every precaution is taken to prevent this. The decision to have a blood transfusion has been considered carefully by your caregiver before blood is given. Blood is not given unless the benefits outweigh the risks. AFTER THE TRANSFUSION  Right after receiving  a blood transfusion, you will usually feel much better and more energetic. This is especially true if your red blood cells have gotten low (anemic). The transfusion raises the level of the red blood cells which carry oxygen, and this usually causes an energy increase.  The nurse administering the transfusion will monitor you carefully for complications. HOME CARE INSTRUCTIONS  No special instructions are needed after a transfusion. You may find your energy is better. Speak with your caregiver about any limitations on activity for underlying diseases you may have. SEEK MEDICAL CARE IF:   Your condition is not improving after your  transfusion.  You develop redness or irritation at the intravenous (IV) site. SEEK IMMEDIATE MEDICAL CARE IF:  Any of the following symptoms occur over the next 12 hours:  Shaking chills.  You have a temperature by mouth above 102 F (38.9 C), not controlled by medicine.  Chest, back, or muscle pain.  People around you feel you are not acting correctly or are confused.  Shortness of breath or difficulty breathing.  Dizziness and fainting.  You get a rash or develop hives.  You have a decrease in urine output.  Your urine turns a dark color or changes to pink, red, or brown. Any of the following symptoms occur over the next 10 days:  You have a temperature by mouth above 102 F (38.9 C), not controlled by medicine.  Shortness of breath.  Weakness after normal activity.  The white part of the eye turns yellow (jaundice).  You have a decrease in the amount of urine or are urinating less often.  Your urine turns a dark color or changes to pink, red, or brown. Document Released: 04/01/2000 Document Revised: 06/27/2011 Document Reviewed: 11/19/2007 [facility] Patient Information 2014 [place], [place].  _______________________________________________________________________

## 2015-07-06 NOTE — Progress Notes (Signed)
EKG 01/20/2015 / chart  LOV note per chart per Dr Sandi Mariscal 02/05/2015  ECHO 03/11/2013  LOV note per cardiology 02/25/2013 per Dr Posey Rea with Dr Ewell/anesthesia in regards to notation in [place] note per cardiology of pt needing followup ECHO within 1 year. Pt states was not given followup appt. Dr Landry Dyke requesting ECHO and clearance per cardiology. LVMM with [place] / Dr Alinda Money in regards to need for appt with cardiology. CXR/epic 04/16/2015

## 2015-07-06 NOTE — Patient Instructions (Signed)
Medication Instructions:  Your physician recommends that you continue on your current medications as directed. Please refer to the Current Medication list given to you today.   Labwork: None ordered   Testing/Procedures: Your physician has requested that you have an echocardiogram. Echocardiography is a painless test that uses sound waves to create images of your heart. It provides your doctor with information about the size and shape of your heart and how well your heart's chambers and valves are working. This procedure takes approximately one hour. There are no restrictions for this procedure.    Follow-Up: Your physician wants you to follow-up in: 24 months with Dr Vallery Ridge will receive a reminder letter in the mail two months in advance. If you don't receive a letter, please call our office to schedule the follow-up appointment.   Any Other Special Instructions Will Be Listed Below (If Applicable).     If you need a refill on your cardiac medications before your next appointment, please call your pharmacy.

## 2015-07-06 NOTE — Telephone Encounter (Signed)
New message      Pt is scheduled to have prostate surgery on thurs.  He was at his pre-op appt today and they said he is overdue to have an echo.  He is calling to schedule it but there is no order in the computer. Is he overdue for an echo?  Patient request a callback

## 2015-07-06 NOTE — Telephone Encounter (Signed)
Needs an echo and an office visit.

## 2015-07-08 NOTE — Anesthesia Preprocedure Evaluation (Signed)
Anesthesia Evaluation  Patient identified by MRN, date of birth, ID band Patient awake    Reviewed: Allergy & Precautions, H&P , NPO status , Patient's Chart, lab work & pertinent test results  Airway Mallampati: II  TM Distance: >3 FB Neck ROM: full    Dental no notable dental hx. (+) Dental Advisory Given, Teeth Intact   Pulmonary neg pulmonary ROS, former smoker,    Pulmonary exam normal breath sounds clear to auscultation       Cardiovascular Exercise Tolerance: Good negative cardio ROS Normal cardiovascular exam+ dysrhythmias Atrial Fibrillation  Rhythm:regular Rate:Normal  ECG - AF   Neuro/Psych negative neurological ROS  negative psych ROS   GI/Hepatic negative GI ROS, Neg liver ROS,   Endo/Other  negative endocrine ROS  Renal/GU negative Renal ROS  negative genitourinary   Musculoskeletal   Abdominal   Peds  Hematology negative hematology ROS (+)   Anesthesia Other Findings   Reproductive/Obstetrics negative OB ROS                             Anesthesia Physical Anesthesia Plan  ASA: III  Anesthesia Plan: General   Post-op Pain Management:    Induction: Intravenous  Airway Management Planned: Oral ETT  Additional Equipment:   Intra-op Plan:   Post-operative Plan: Extubation in OR  Informed Consent: I have reviewed the patients History and Physical, chart, labs and discussed the procedure including the risks, benefits and alternatives for the proposed anesthesia with the patient or authorized representative who has indicated his/her understanding and acceptance.   Dental Advisory Given  Plan Discussed with: CRNA and Surgeon  Anesthesia Plan Comments:         Anesthesia Quick Evaluation

## 2015-07-08 NOTE — H&P (Signed)
History of Present Illness Mr. Relles is a 70 year old gentleman who was noted to have an abnormal prostate exam with induration of the left base and mid gland. His PSA was only 0.756. He underwent a TRUS prostate biopsy on 05/19/15 confirming Gleason 3+4=7 adenocarcinoma of the prostate with 6 out of 12 biopsy cores positive for malignancy (all left sided). He has no family history of prostate cancer.     TNM stage: cT2b Nx Mx (left sided induration)  PSA: 0.756  Gleason score: 3+4=7  Biopsy (05/19/15): 6/12 cores positive    Left: L lateral apex (30%, 3+3=6), L apex (10%, 3+3=6), L lateral mid 60%, 3+4=7), L mid (70%, 3+4=7), L lateral base (20%, 3+4=7, PNI), L base (70%, 3+4=7)    Right: Benign  Prostate volume: 21.7 cc    Nomogram  OC disease: 37%  EPE: 61%  SVI: 5%  LNI: 5%  PFS (surgery): 94% at 5 years, 89% at 10 years    Urinary function: He has BPH and LUTS managed with finasteride. Baseline IPSS is 12. This is mostly related to symptoms including urinary frequency, nocturia, and a weak stream. He has previously tried tamsulosin which did not significantly impact his symptoms. He has been on finasteride for approximately 5 years and does not feel that this has significantly impact his symptoms either.  Erectile function: He has long standing Peyronie's disease precluding erections adequate for intercourse. Sexual function has been a low priority based on this issue.   Past Medical History Problems  1. History of atrial fibrillation (Z86.79) 2. History of gout (Z87.39) 3. History of hyperlipidemia (Z86.39)  Surgical History Problems  1. History of Arthroplasty For Hammertoe 2. History of Arthrotomy Of Knee With Open Meniscus Repair 3. History of Catheter Ablation Atrial Fibrillation 4. History of Complete Colonoscopy 5. History of Nerve Ablation Lumbar Facet Joint Additional Level  Current Meds 1. Allopurinol 300 MG Oral Tablet;  Therapy:  (Recorded:10Jan2017) to Recorded 2. Aspir-81 81 MG Oral Tablet Delayed Release;  Therapy: (Recorded:10Jan2017) to Recorded 3. Finasteride 5 MG Oral Tablet;  Therapy: (Recorded:10Jan2017) to Recorded 4. LevoFLOXacin 500 MG Oral Tablet; 1 tablet the day before procedure, 1 tablet day of  procedure, and 1 tablet day after procedure;  Therapy: [id number] to (Last Rx:11Jan2017)  Requested for: [id number] Ordered 5. Simvastatin 40 MG Oral Tablet;  Therapy: (Recorded:10Jan2017) to Recorded 6. Vitamin D3 1000 UNIT Oral Tablet;  Therapy: (Recorded:10Jan2017) to Recorded  Allergies Medication  1. Tamsulosin HCl CAPS 2. Toviaz TB57  Family History Problems  1. Family history of diabetes mellitus (Z83.3) : Brother 2. Family history of macular degeneration [id number]) : Mother 3. Family history of malignant neoplasm of tongue (Z80.8) : Brother 4. Family history of myocardial infarction (Z82.49) : Father   passed away at age 23 5. Family history of pneumonia (Z83.1) : Mother 6. Denied: Family history of prostate cancer 7. Family history of substance abuse (Z81.4) : Son 8. Family history of Multilevel degenerative disc disease : Mother  Social History Problems  1. Alcohol use (Z78.9)   2-3 2. Former smoker [phone])   quit smoking in 1977 3. Married 4. Number of children   1 son; 1 daughter 5. Occupation   Network engineer  Physical Exam Constitutional: Well nourished and well developed . No acute distress.       Assessment Assessed  1. Prostate cancer (C61)    Discussion/Summary 1. Prostate cancer: He has elected to proceed with surgical therapy and will undergo a RAL radical  prostatectomy and BPLND.

## 2015-07-09 ENCOUNTER — Encounter (HOSPITAL_COMMUNITY): Payer: Self-pay | Admitting: *Deleted

## 2015-07-09 ENCOUNTER — Inpatient Hospital Stay (HOSPITAL_COMMUNITY): Payer: Medicare Other | Admitting: Anesthesiology

## 2015-07-09 ENCOUNTER — Inpatient Hospital Stay (HOSPITAL_COMMUNITY)
Admission: AD | Admit: 2015-07-09 | Discharge: 2015-07-10 | DRG: 708 | Disposition: A | Discharge status: Home / Self Care | Payer: Medicare Other | Source: Ambulatory Visit | Attending: Urology | Admitting: Urology

## 2015-07-09 ENCOUNTER — Encounter (HOSPITAL_COMMUNITY)
Admission: AD | Disposition: A | Discharge status: Home / Self Care | Payer: Self-pay | Source: Ambulatory Visit | Attending: Urology

## 2015-07-09 DIAGNOSIS — I4891 Unspecified atrial fibrillation: Secondary | ICD-10-CM | POA: Diagnosis present

## 2015-07-09 DIAGNOSIS — N3289 Other specified disorders of bladder: Secondary | ICD-10-CM | POA: Diagnosis not present

## 2015-07-09 DIAGNOSIS — Z87891 Personal history of nicotine dependence: Secondary | ICD-10-CM

## 2015-07-09 DIAGNOSIS — E785 Hyperlipidemia, unspecified: Secondary | ICD-10-CM | POA: Diagnosis present

## 2015-07-09 DIAGNOSIS — Z79899 Other long term (current) drug therapy: Secondary | ICD-10-CM

## 2015-07-09 DIAGNOSIS — C61 Malignant neoplasm of prostate: Principal | ICD-10-CM | POA: Diagnosis present

## 2015-07-09 DIAGNOSIS — Z7982 Long term (current) use of aspirin: Secondary | ICD-10-CM | POA: Diagnosis not present

## 2015-07-09 DIAGNOSIS — N486 Induration penis plastica: Secondary | ICD-10-CM | POA: Diagnosis present

## 2015-07-09 DIAGNOSIS — M109 Gout, unspecified: Secondary | ICD-10-CM | POA: Diagnosis not present

## 2015-07-09 HISTORY — PX: ROBOT ASSISTED LAPAROSCOPIC RADICAL PROSTATECTOMY: SHX5141

## 2015-07-09 HISTORY — PX: LYMPHADENECTOMY: SHX5960

## 2015-07-09 LAB — TYPE AND SCREEN
ABO/RH(D): B POS
ANTIBODY SCREEN: NEGATIVE

## 2015-07-09 LAB — HEMOGLOBIN AND HEMATOCRIT, BLOOD
HCT: 43 % (ref 39.0–52.0)
Hemoglobin: 14.4 g/dL (ref 13.0–17.0)

## 2015-07-09 SURGERY — XI ROBOTIC ASSISTED LAPAROSCOPIC RADICAL PROSTATECTOMY LEVEL 2
Anesthesia: General

## 2015-07-09 MED ORDER — MIDAZOLAM HCL 2 MG/2ML IJ SOLN
INTRAMUSCULAR | Status: AC
Start: 2015-07-09 — End: 2015-07-09
  Filled 2015-07-09: qty 2

## 2015-07-09 MED ORDER — MIDAZOLAM HCL 5 MG/5ML IJ SOLN
INTRAMUSCULAR | Status: DC | PRN
  Administered 2015-07-09: 1 mg via INTRAVENOUS

## 2015-07-09 MED ORDER — NEOSTIGMINE METHYLSULFATE 10 MG/10ML IV SOLN
INTRAVENOUS | Status: AC
  Filled 2015-07-09: qty 1

## 2015-07-09 MED ORDER — MEPERIDINE HCL 50 MG/ML IJ SOLN
6.2500 mg | INTRAMUSCULAR | Status: DC | PRN
  Administered 2015-07-09 (×2): 12.5 mg via INTRAVENOUS

## 2015-07-09 MED ORDER — DIPHENHYDRAMINE HCL 50 MG/ML IJ SOLN: 12.5000 mg | Freq: Four times a day (QID) | INTRAMUSCULAR | Status: DC | PRN

## 2015-07-09 MED ORDER — DIPHENHYDRAMINE HCL 12.5 MG/5ML PO ELIX: 12.5000 mg | ORAL_SOLUTION | Freq: Four times a day (QID) | ORAL | Status: DC | PRN

## 2015-07-09 MED ORDER — BELLADONNA ALKALOIDS-OPIUM 16.2-60 MG RE SUPP
1.0000 | Freq: Four times a day (QID) | RECTAL | Status: DC | PRN
  Administered 2015-07-09: 1 via RECTAL
  Filled 2015-07-09: qty 1

## 2015-07-09 MED ORDER — CEFAZOLIN SODIUM 1-5 GM-% IV SOLN
1.0000 g | Freq: Three times a day (TID) | INTRAVENOUS | Status: AC
  Administered 2015-07-09 – 2015-07-10 (×2): 1 g via INTRAVENOUS
  Filled 2015-07-09 (×2): qty 50

## 2015-07-09 MED ORDER — ONDANSETRON HCL 4 MG/2ML IJ SOLN
INTRAMUSCULAR | Status: AC
  Filled 2015-07-09: qty 2

## 2015-07-09 MED ORDER — SODIUM CHLORIDE 0.9 % IR SOLN
Status: DC | PRN
  Administered 2015-07-09: 250 mL via INTRAVESICAL

## 2015-07-09 MED ORDER — ZOLPIDEM TARTRATE 5 MG PO TABS
5.0000 mg | ORAL_TABLET | Freq: Every evening | ORAL | Status: DC | PRN
  Administered 2015-07-10: 5 mg via ORAL
  Filled 2015-07-09: qty 1

## 2015-07-09 MED ORDER — PHENYLEPHRINE HCL 10 MG/ML IJ SOLN
INTRAMUSCULAR | Status: DC | PRN
  Administered 2015-07-09: 80 ug via INTRAVENOUS
  Administered 2015-07-09: 40 ug via INTRAVENOUS

## 2015-07-09 MED ORDER — LACTATED RINGERS IV SOLN: INTRAVENOUS | Status: DC

## 2015-07-09 MED ORDER — FENTANYL CITRATE (PF) 100 MCG/2ML IJ SOLN
INTRAMUSCULAR | Status: DC | PRN
  Administered 2015-07-09 (×2): 50 ug via INTRAVENOUS
  Administered 2015-07-09: 25 ug via INTRAVENOUS
  Administered 2015-07-09: 50 ug via INTRAVENOUS
  Administered 2015-07-09: 25 ug via INTRAVENOUS
  Administered 2015-07-09: 50 ug via INTRAVENOUS
  Administered 2015-07-09: 100 ug via INTRAVENOUS

## 2015-07-09 MED ORDER — MORPHINE SULFATE (PF) 2 MG/ML IV SOLN
2.0000 mg | INTRAVENOUS | Status: DC | PRN
  Administered 2015-07-09 (×2): 2 mg via INTRAVENOUS
  Filled 2015-07-09 (×3): qty 1

## 2015-07-09 MED ORDER — BUPIVACAINE-EPINEPHRINE (PF) 0.25% -1:200000 IJ SOLN
INTRAMUSCULAR | Status: AC
  Filled 2015-07-09: qty 30

## 2015-07-09 MED ORDER — DOCUSATE SODIUM 100 MG PO CAPS
100.0000 mg | ORAL_CAPSULE | Freq: Two times a day (BID) | ORAL | Status: DC
  Administered 2015-07-09 – 2015-07-10 (×2): 100 mg via ORAL
  Filled 2015-07-09 (×2): qty 1

## 2015-07-09 MED ORDER — HYDROCODONE-ACETAMINOPHEN 5-325 MG PO TABS: 1.0000 | ORAL_TABLET | Freq: Four times a day (QID) | ORAL | Status: DC | PRN

## 2015-07-09 MED ORDER — BELLADONNA ALKALOIDS-OPIUM 16.2-60 MG RE SUPP
1.0000 | Freq: Once | RECTAL | Status: AC
  Administered 2015-07-09: 1 via RECTAL
  Filled 2015-07-09: qty 1

## 2015-07-09 MED ORDER — PROPOFOL 10 MG/ML IV BOLUS
INTRAVENOUS | Status: AC
  Filled 2015-07-09: qty 20

## 2015-07-09 MED ORDER — LIDOCAINE HCL (CARDIAC) 20 MG/ML IV SOLN
INTRAVENOUS | Status: AC
  Filled 2015-07-09: qty 5

## 2015-07-09 MED ORDER — SULFAMETHOXAZOLE-TRIMETHOPRIM 800-160 MG PO TABS: 1.0000 | ORAL_TABLET | Freq: Two times a day (BID) | ORAL | Status: DC

## 2015-07-09 MED ORDER — MEPERIDINE HCL 25 MG/ML IJ SOLN
INTRAMUSCULAR | Status: AC
  Filled 2015-07-09: qty 1

## 2015-07-09 MED ORDER — GLYCOPYRROLATE 0.2 MG/ML IJ SOLN
INTRAMUSCULAR | Status: DC | PRN
  Administered 2015-07-09 (×2): 0.1 mg via INTRAVENOUS

## 2015-07-09 MED ORDER — GLYCOPYRROLATE 0.2 MG/ML IJ SOLN
INTRAMUSCULAR | Status: AC
  Filled 2015-07-09: qty 3

## 2015-07-09 MED ORDER — SUGAMMADEX SODIUM 200 MG/2ML IV SOLN
INTRAVENOUS | Status: DC | PRN
  Administered 2015-07-09: 200 mg via INTRAVENOUS

## 2015-07-09 MED ORDER — STERILE WATER FOR IRRIGATION IR SOLN
Status: DC | PRN
  Administered 2015-07-09: 1000 mL

## 2015-07-09 MED ORDER — CEFAZOLIN SODIUM-DEXTROSE 2-3 GM-% IV SOLR
INTRAVENOUS | Status: AC
  Filled 2015-07-09: qty 50

## 2015-07-09 MED ORDER — DEXTROSE 5 % IV SOLN
2.0000 g | INTRAVENOUS | Status: AC
  Administered 2015-07-09: 2 g via INTRAVENOUS
  Administered 2015-07-09: 07:00:00 via INTRAVENOUS

## 2015-07-09 MED ORDER — ONDANSETRON HCL 4 MG/2ML IJ SOLN
INTRAMUSCULAR | Status: DC | PRN
  Administered 2015-07-09: 4 mg via INTRAVENOUS

## 2015-07-09 MED ORDER — FENTANYL CITRATE (PF) 250 MCG/5ML IJ SOLN
INTRAMUSCULAR | Status: AC
  Filled 2015-07-09: qty 5

## 2015-07-09 MED ORDER — ALLOPURINOL 300 MG PO TABS: 300.0000 mg | ORAL_TABLET | Freq: Every day | ORAL | Status: DC

## 2015-07-09 MED ORDER — BUPIVACAINE-EPINEPHRINE 0.25% -1:200000 IJ SOLN
INTRAMUSCULAR | Status: DC | PRN
  Administered 2015-07-09: 30 mL

## 2015-07-09 MED ORDER — PROPOFOL 10 MG/ML IV BOLUS
INTRAVENOUS | Status: DC | PRN
  Administered 2015-07-09: 150 mg via INTRAVENOUS
  Administered 2015-07-09: 50 mg via INTRAVENOUS

## 2015-07-09 MED ORDER — KETOROLAC TROMETHAMINE 15 MG/ML IJ SOLN
15.0000 mg | Freq: Four times a day (QID) | INTRAMUSCULAR | Status: DC
  Administered 2015-07-09 – 2015-07-10 (×4): 15 mg via INTRAVENOUS
  Filled 2015-07-09 (×5): qty 1

## 2015-07-09 MED ORDER — LACTATED RINGERS IV SOLN
INTRAVENOUS | Status: DC | PRN
  Administered 2015-07-09 (×3): via INTRAVENOUS

## 2015-07-09 MED ORDER — SODIUM CHLORIDE 0.9 % IV BOLUS (SEPSIS)
1000.0000 mL | Freq: Once | INTRAVENOUS | Status: AC
  Administered 2015-07-09: 1000 mL via INTRAVENOUS

## 2015-07-09 MED ORDER — LACTATED RINGERS IV SOLN
INTRAVENOUS | Status: DC | PRN
  Administered 2015-07-09: 200 mL

## 2015-07-09 MED ORDER — FENTANYL CITRATE (PF) 100 MCG/2ML IJ SOLN
INTRAMUSCULAR | Status: AC
  Filled 2015-07-09: qty 2

## 2015-07-09 MED ORDER — SIMVASTATIN 40 MG PO TABS
40.0000 mg | ORAL_TABLET | Freq: Every day | ORAL | Status: DC
  Administered 2015-07-09: 40 mg via ORAL
  Filled 2015-07-09: qty 1

## 2015-07-09 MED ORDER — LIDOCAINE HCL (CARDIAC) 20 MG/ML IV SOLN
INTRAVENOUS | Status: DC | PRN
  Administered 2015-07-09: 40 mg via INTRAVENOUS

## 2015-07-09 MED ORDER — SUGAMMADEX SODIUM 200 MG/2ML IV SOLN
INTRAVENOUS | Status: AC
  Filled 2015-07-09: qty 2

## 2015-07-09 MED ORDER — ROCURONIUM BROMIDE 100 MG/10ML IV SOLN
INTRAVENOUS | Status: DC | PRN
  Administered 2015-07-09: 10 mg via INTRAVENOUS
  Administered 2015-07-09: 50 mg via INTRAVENOUS
  Administered 2015-07-09 (×4): 10 mg via INTRAVENOUS
  Administered 2015-07-09: 5 mg via INTRAVENOUS

## 2015-07-09 MED ORDER — KCL IN DEXTROSE-NACL 20-5-0.45 MEQ/L-%-% IV SOLN
INTRAVENOUS | Status: DC
  Administered 2015-07-09 (×2): via INTRAVENOUS
  Filled 2015-07-09 (×6): qty 1000

## 2015-07-09 MED ORDER — HYDROMORPHONE HCL 1 MG/ML IJ SOLN: 0.2500 mg | INTRAMUSCULAR | Status: DC | PRN

## 2015-07-09 MED ORDER — ACETAMINOPHEN 325 MG PO TABS
650.0000 mg | ORAL_TABLET | ORAL | Status: DC | PRN
  Administered 2015-07-09: 650 mg via ORAL
  Filled 2015-07-09: qty 2

## 2015-07-09 MED ORDER — HEPARIN SODIUM (PORCINE) 1000 UNIT/ML IJ SOLN
INTRAMUSCULAR | Status: AC
  Filled 2015-07-09: qty 1

## 2015-07-09 SURGICAL SUPPLY — 51 items
APPLICATOR COTTON TIP 6IN STRL (MISCELLANEOUS) IMPLANT
CATH FOLEY 2WAY SLVR 18FR 30CC (CATHETERS) ×4 IMPLANT
CATH ROBINSON RED A/P 16FR (CATHETERS) ×4 IMPLANT
CATH ROBINSON RED A/P 8FR (CATHETERS) ×4 IMPLANT
CATH TIEMANN FOLEY 18FR 5CC (CATHETERS) ×4 IMPLANT
CHLORAPREP W/TINT 26ML (MISCELLANEOUS) ×4 IMPLANT
CLIP LIGATING HEM O LOK PURPLE (MISCELLANEOUS) ×8 IMPLANT
COVER SURGICAL LIGHT HANDLE (MISCELLANEOUS) ×4 IMPLANT
COVER TIP SHEARS 8 DVNC (MISCELLANEOUS) ×2 IMPLANT
COVER TIP SHEARS 8MM DA VINCI (MISCELLANEOUS) ×2
CUTTER ECHEON FLEX ENDO 45 340 (ENDOMECHANICALS) ×4 IMPLANT
DECANTER SPIKE VIAL GLASS SM (MISCELLANEOUS) IMPLANT
DRAPE ARM DVNC X/XI (DISPOSABLE) ×8 IMPLANT
DRAPE COLUMN DVNC XI (DISPOSABLE) ×2 IMPLANT
DRAPE DA VINCI XI ARM (DISPOSABLE) ×8
DRAPE DA VINCI XI COLUMN (DISPOSABLE) ×2
DRAPE SURG IRRIG POUCH 19X23 (DRAPES) ×4 IMPLANT
DRSG TEGADERM 4X4.75 (GAUZE/BANDAGES/DRESSINGS) ×4 IMPLANT
ELECT REM PT RETURN 9FT ADLT (ELECTROSURGICAL) ×4
ELECTRODE REM PT RTRN 9FT ADLT (ELECTROSURGICAL) ×2 IMPLANT
GLOVE BIO SURGEON STRL SZ 6.5 (GLOVE) ×3 IMPLANT
GLOVE BIO SURGEONS STRL SZ 6.5 (GLOVE) ×1
GLOVE BIOGEL M STRL SZ7.5 (GLOVE) ×8 IMPLANT
GOWN STRL REUS W/TWL LRG LVL3 (GOWN DISPOSABLE) ×12 IMPLANT
HOLDER FOLEY CATH W/STRAP (MISCELLANEOUS) ×4 IMPLANT
IV LACTATED RINGERS 1000ML (IV SOLUTION) ×4 IMPLANT
LIQUID BAND (GAUZE/BANDAGES/DRESSINGS) ×4 IMPLANT
NDL SAFETY ECLIPSE 18X1.5 (NEEDLE) ×2 IMPLANT
NEEDLE HYPO 18GX1.5 SHARP (NEEDLE) ×3
PACK ROBOT UROLOGY CUSTOM (CUSTOM PROCEDURE TRAY) ×4 IMPLANT
RELOAD GREEN ECHELON 45 (STAPLE) ×4 IMPLANT
SEAL CANN UNIV 5-8 DVNC XI (MISCELLANEOUS) ×8 IMPLANT
SEAL XI 5MM-8MM UNIVERSAL (MISCELLANEOUS) ×8
SET TUBE IRRIG SUCTION NO TIP (IRRIGATION / IRRIGATOR) ×4 IMPLANT
SOLUTION ELECTROLUBE (MISCELLANEOUS) ×4 IMPLANT
SUT ETHILON 3 0 PS 1 (SUTURE) ×4 IMPLANT
SUT MNCRL 3 0 RB1 (SUTURE) ×2 IMPLANT
SUT MNCRL 3 0 VIOLET RB1 (SUTURE) ×2 IMPLANT
SUT MNCRL AB 4-0 PS2 18 (SUTURE) ×8 IMPLANT
SUT MONOCRYL 3 0 RB1 (SUTURE) ×4
SUT VIC AB 0 CT1 27 (SUTURE) ×2
SUT VIC AB 0 CT1 27XBRD ANTBC (SUTURE) ×2 IMPLANT
SUT VIC AB 0 UR5 27 (SUTURE) ×4 IMPLANT
SUT VIC AB 2-0 SH 27 (SUTURE) ×2
SUT VIC AB 2-0 SH 27X BRD (SUTURE) ×2 IMPLANT
SUT VICRYL 0 UR6 27IN ABS (SUTURE) ×8 IMPLANT
SYR 27GX1/2 1ML LL SAFETY (SYRINGE) ×4 IMPLANT
TOWEL OR 17X26 10 PK STRL BLUE (TOWEL DISPOSABLE) ×4 IMPLANT
TOWEL OR NON WOVEN STRL DISP B (DISPOSABLE) ×4 IMPLANT
TUBING INSUFFLATION 10FT LAP (TUBING) ×4 IMPLANT
WATER STERILE IRR 1500ML POUR (IV SOLUTION) ×4 IMPLANT

## 2015-07-09 NOTE — Anesthesia Procedure Notes (Signed)
Procedure Name: Intubation Date/Time: 07/09/2015 7:23 AM Performed by: Cynda Familia Pre-anesthesia Checklist: Patient identified, Emergency Drugs available, Suction available, Patient being monitored and Timeout performed Patient Re-evaluated:Patient Re-evaluated prior to inductionOxygen Delivery Method: Circle system utilized Preoxygenation: Pre-oxygenation with 100% oxygen Intubation Type: IV induction Ventilation: Mask ventilation without difficulty Laryngoscope Size: Miller and 2 Grade View: Grade I Tube type: Oral Tube size: 7.5 mm Number of attempts: 1 Airway Equipment and Method: Stylet Placement Confirmation: ETT inserted through vocal cords under direct vision,  positive ETCO2 and breath sounds checked- equal and bilateral Secured at: 22 cm Tube secured with: Tape Dental Injury: Teeth and Oropharynx as per pre-operative assessment  Comments: IV induction smooth- Ewell present- intubation atraumatic---  Teeth and mouth as preop- slightly anterior view

## 2015-07-09 NOTE — Progress Notes (Signed)
Post-op note  Subjective: The patient is doing well.  No complaints except bladder spasms  Objective: Vital signs in last 24 hours: Temp:  [97.3 F (36.3 C)-97.6 F (36.4 C)] 97.6 F (36.4 C) (03/23 1428) Pulse Rate:  [52-76] 72 (03/23 1428) Resp:  [10-18] 18 (03/23 1428) BP: (112-142)/(78-102) 112/78 mmHg (03/23 1428) SpO2:  [94 %-100 %] 100 % (03/23 1428) Weight:  [99.678 kg (219 lb 12 oz)] 99.678 kg (219 lb 12 oz) (03/23 0555)  Intake/Output from previous day:   Intake/Output this shift: Total I/O In: 4095 [I.V.:3095; IV Piggyback:1000] Out: 265 [Urine:75; Drains:90; Blood:100]  Physical Exam:  General: Alert and oriented. Abdomen: Soft, Nondistended. Incisions: Clean and dry. Urine: red  Lab Results:  Recent Labs  07/09/15 1024  HGB 14.4  HCT 43.0    Assessment/Plan: POD#0   1) Continue to monitor  2) DVT prophy, clears, IS, amb, pain control  3) B/O for spasms; irrigate cath if needed   LOS: 0 days   Zachary Sanchez 07/09/2015, 3:35 PM

## 2015-07-09 NOTE — Anesthesia Postprocedure Evaluation (Signed)
Anesthesia Post Note  Patient: Zachary Sanchez  Procedure(s) Performed: Procedure(s) (LRB): XI ROBOTIC ASSISTED LAPAROSCOPIC RADICAL PROSTATECTOMY LEVEL 2 (N/A) PELVIC LYMPHADENECTOMY (Bilateral)  Patient location during evaluation: PACU Anesthesia Type: General Level of consciousness: awake and alert Pain management: pain level controlled Vital Signs Assessment: post-procedure vital signs reviewed and stable Respiratory status: spontaneous breathing, nonlabored ventilation, respiratory function stable and patient connected to nasal cannula oxygen Cardiovascular status: blood pressure returned to baseline and stable Postop Assessment: no signs of nausea or vomiting Anesthetic complications: no    Last Vitals:  Filed Vitals:   07/09/15 1100 07/09/15 1118  BP: 137/95 139/89  Pulse: 62 54  Temp: 36.4 C   Resp: 13 14    Last Pain:  Filed Vitals:   07/09/15 1123  PainSc: 3                  Ivie Maese L

## 2015-07-09 NOTE — Discharge Instructions (Signed)

## 2015-07-09 NOTE — Progress Notes (Signed)
Pt had 100 ml output in foley catheter with a few clots and pressure in bladder area, paged urology on-call and received call back from [place], [place]. Told to place order for catheter irrigation as needed. PRN B&O given, instructed patient to let us know if pressure subsides. Will follow-up with patient and irrigate as needed.

## 2015-07-09 NOTE — Op Note (Signed)
Preoperative diagnosis: Clinically localized adenocarcinoma of the prostate (clinical stage T2b Nx Mx)  Postoperative diagnosis: Clinically localized adenocarcinoma of the prostate (clinical stage T2b Nx Mx)  Procedure:  1. Robotic assisted laparoscopic radical prostatectomy (right nerve sparing) 2. Bilateral robotic assisted laparoscopic pelvic lymphadenectomy  Surgeon: Pryor Curia. M.D.  Assistant(s): Debbrah Alar, PA-C  Anesthesia: General  Complications: None  EBL: 100 mL  IVF:  2000 mL crystalloid  Specimens: 1. Prostate and seminal vesicles 2. Right pelvic lymph nodes 3. Left pelvic lymph nodes  Disposition of specimens: Pathology  Drains: 1. 20 Fr coude catheter 2. # 19 Blake pelvic drain  Indication: Zachary Sanchez is a 70 y.o. patient with clinically localized prostate cancer.  After a thorough review of the management options for treatment of prostate cancer, he elected to proceed with surgical therapy and the above procedure(s).  We have discussed the potential benefits and risks of the procedure, side effects of the proposed treatment, the likelihood of the patient achieving the goals of the procedure, and any potential problems that might occur during the procedure or recuperation. Informed consent has been obtained.  Description of procedure:  The patient was taken to the operating room and a general anesthetic was administered. He was given preoperative antibiotics, placed in the dorsal lithotomy position, and prepped and draped in the usual sterile fashion. Next a preoperative timeout was performed. A urethral catheter was placed into the bladder and a site was selected near the umbilicus for placement of the camera port. This was placed using a standard open Hassan technique which allowed entry into the peritoneal cavity under direct vision and without difficulty. A 8 mm port was placed and a pneumoperitoneum established. The camera was then used to inspect  the abdomen and there was no evidence of any intra-abdominal injuries or other abnormalities. The remaining abdominal ports were then placed. 8 mm robotic ports were placed in the right lower quadrant, left lower quadrant, and far left lateral abdominal wall. A 5 mm port was placed in the right upper quadrant and a 12 mm port was placed in the right lateral abdominal wall for laparoscopic assistance. All ports were placed under direct vision without difficulty. The surgical cart was then docked.   Utilizing the cautery scissors, the bladder was reflected posteriorly allowing entry into the space of Retzius and identification of the endopelvic fascia and prostate. The periprostatic fat was then removed from the prostate allowing full exposure of the endopelvic fascia. The endopelvic fascia was then incised from the apex back to the base of the prostate bilaterally and the underlying levator muscle fibers were swept laterally off the prostate thereby isolating the dorsal venous complex. The dorsal vein was then stapled and divided with a 45 mm Flex Echelon stapler. Attention then turned to the bladder neck which was divided anteriorly thereby allowing entry into the bladder and exposure of the urethral catheter. The catheter balloon was deflated and the catheter was brought into the operative field and used to retract the prostate anteriorly. The posterior bladder neck was then examined and was divided allowing further dissection between the bladder and prostate posteriorly until the vasa deferentia and seminal vessels were identified. The vasa deferentia were isolated, divided, and lifted anteriorly. The seminal vesicles were dissected down to their tips with care to control the seminal vascular arterial blood supply. These structures were then lifted anteriorly and the space between Denonvillier's fascia and the anterior rectum was developed with a combination of sharp  and blunt dissection. This isolated the  vascular pedicles of the prostate.  The lateral prostatic fascia on the right side of the prostate was then sharply incised allowing release of the neurovascular bundle. The vascular pedicle of the prostate on the right side was then ligated with Weck clips between the prostate and neurovascular bundle and divided with sharp cold scissor dissection resulting in neurovascular bundle preservation. On the left side, a wide non nerve sparing dissection was performed with Weck clips used to ligate the vascular pedicle of the prostate. The neurovascular bundle on the right side was then separated off the apex of the prostate and urethra.  The urethra was then sharply transected allowing the prostate specimen to be disarticulated. The pelvis was copiously irrigated and hemostasis was ensured. There was no evidence for rectal injury.  Attention then turned to the right pelvic sidewall. The fibrofatty tissue between the external iliac vein, confluence of the iliac vessels, hypogastric artery, and Cooper's ligament was dissected free from the pelvic sidewall with care to preserve the obturator nerve. Weck clips were used for lymphostasis and hemostasis. An identical procedure was performed on the contralateral side and the lymphatic packets were removed for permanent pathologic analysis.  Attention then turned to the urethral anastomosis. A 2-0 Vicryl slip knot was placed between Denonvillier's fascia, the posterior bladder neck, and the posterior urethra to reapproximate these structures. A double-armed 3-0 Monocryl suture was then used to perform a 360 running tension-free anastomosis between the bladder neck and urethra. A new urethral catheter was then placed into the bladder and irrigated. There were no blood clots within the bladder and the anastomosis appeared to be watertight. A #19 Blake drain was then brought through the left lateral 8 mm port site and positioned appropriately within the pelvis. It was  secured to the skin with a nylon suture. The surgical cart was then undocked. The right lateral 12 mm port site was closed at the fascial level with a 0 Vicryl suture placed laparoscopically. All remaining ports were then removed under direct vision. The prostate specimen was removed intact within the Endopouch retrieval bag via the periumbilical camera port site. This fascial opening was closed with two running 0 Vicryl sutures. 0.25% Marcaine was then injected into all port sites and all incisions were reapproximated at the skin level with 4-0 Monocryl subcuticular sutures. Dermabond was applied. The patient appeared to tolerate the procedure well and without complications. The patient was able to be extubated and transferred to the recovery unit in satisfactory condition.   Pryor Curia MD

## 2015-07-09 NOTE — Transfer of Care (Signed)
Immediate Anesthesia Transfer of Care Note  Patient: Zachary Sanchez  Procedure(s) Performed: Procedure(s): XI ROBOTIC ASSISTED LAPAROSCOPIC RADICAL PROSTATECTOMY LEVEL 2 (N/A) PELVIC LYMPHADENECTOMY (Bilateral)  Patient Location: PACU  Anesthesia Type:General  Level of Consciousness: awake and alert   Airway & Oxygen Therapy: Patient Spontanous Breathing and Patient connected to face mask oxygen  Post-op Assessment: Report given to RN and Post -op Vital signs reviewed and stable  Post vital signs: Reviewed and stable  Last Vitals:  Filed Vitals:   07/09/15 0519 07/09/15 1018  BP: 133/94   Pulse: 76   Temp: 36.3 C 36.4 C  Resp: 18     Complications: No apparent anesthesia complications

## 2015-07-10 LAB — HEMOGLOBIN AND HEMATOCRIT, BLOOD
HEMATOCRIT: 35.4 % — AB (ref 39.0–52.0)
Hemoglobin: 12.2 g/dL — ABNORMAL LOW (ref 13.0–17.0)

## 2015-07-10 MED ORDER — SODIUM CHLORIDE 0.9 % IV BOLUS (SEPSIS)
500.0000 mL | Freq: Once | INTRAVENOUS | Status: AC
  Administered 2015-07-10: 500 mL via INTRAVENOUS

## 2015-07-10 MED ORDER — BISACODYL 10 MG RE SUPP
10.0000 mg | Freq: Once | RECTAL | Status: AC
  Administered 2015-07-10: 10 mg via RECTAL
  Filled 2015-07-10: qty 1

## 2015-07-10 MED ORDER — HYDROCODONE-ACETAMINOPHEN 5-325 MG PO TABS: 1.0000 | ORAL_TABLET | Freq: Four times a day (QID) | ORAL | Status: DC | PRN

## 2015-07-10 NOTE — Progress Notes (Signed)
Pt felt pressure relief after B&O given, still very little output from foley catheter. Irrigated catheter with 60 mls, pulled pink fluid back with no clots. Red tinged urine noted in foley catheter draining into collection bag. Will continue to monitor closely.

## 2015-07-10 NOTE — Progress Notes (Signed)
Patient ID: Zachary Sanchez, male   DOB: February 09, 1946, 70 y.o.   MRN: [id number]  1 Day Post-Op Subjective: The patient is doing well.  No nausea or vomiting. Pain is adequately controlled.  UOP was low last night, now improving.  Bladder spasms last night treated with B&O adequately.  Objective: Vital signs in last 24 hours: Temp:  [97.5 F (36.4 C)-98.5 F (36.9 C)] 97.7 F (36.5 C) (03/24 0626) Pulse Rate:  [52-80] 63 (03/24 0626) Resp:  [10-18] 15 (03/24 0626) BP: (95-142)/(62-102) 114/82 mmHg (03/24 0626) SpO2:  [94 %-100 %] 94 % (03/24 0626)  Intake/Output from previous day: 03/23 0701 - 03/24 0700 In: 5550 [I.V.:4440; IV Piggyback:1050] Out: 1165 [Urine:775; Drains:290; Blood:100] Intake/Output this shift:    Physical Exam:  General: Alert and oriented. CV: RRR Lungs: Clear bilaterally. GI: Soft, Nondistended. Positive BS. Incisions: Clean, dry, and intact Urine: Clear Extremities: Nontender, no erythema, no edema.  Lab Results:  Recent Labs  07/09/15 1024 07/10/15 0445  HGB 14.4 12.2*  HCT 43.0 35.4*      Assessment/Plan: POD# 1 s/p robotic prostatectomy.  1) SL IVF after 500 cc NS bolus to increase hydration 2) Ambulate, Incentive spirometry 3) Transition to oral pain medication 4) Dulcolax suppository 5) D/C pelvic drain 6) Plan for likely discharge later today   Pryor Curia. MD   LOS: 1 day   Zachary Sanchez,LES 07/10/2015, 7:32 AM

## 2015-07-10 NOTE — Discharge Summary (Signed)
  Date of admission: 07/09/2015  Date of discharge: 07/10/2015  Admission diagnosis: Prostate Cancer  Discharge diagnosis: Prostate Cancer  History and Physical: For full details, please see admission history and physical. Briefly, Zachary Sanchez is a 70 y.o. gentleman with localized prostate cancer.  After discussing management/treatment options, he elected to proceed with surgical treatment.  Hospital Course: Zachary Sanchez was taken to the operating room on 07/09/2015 and underwent a robotic assisted laparoscopic radical prostatectomy. He tolerated this procedure well and without complications. Postoperatively, he was able to be transferred to a regular hospital room following recovery from anesthesia.  He was able to begin ambulating the night of surgery. He remained hemodynamically stable overnight.  He had excellent urine output with appropriately minimal output from his pelvic drain and his pelvic drain was removed on POD #1.  He was transitioned to oral pain medication, tolerated a clear liquid diet, and had met all discharge criteria and was able to be discharged home later on POD#1.  Laboratory values:  Recent Labs  07/09/15 1024 07/10/15 0445  HGB 14.4 12.2*  HCT 43.0 35.4*    Disposition: Home  Discharge instruction: He was instructed to be ambulatory but to refrain from heavy lifting, strenuous activity, or driving. He was instructed on urethral catheter care.  Discharge medications:     Medication List    STOP taking these medications        aspirin 325 MG tablet     finasteride 5 MG tablet  Commonly known as:  PROSCAR     sildenafil 50 MG tablet  Commonly known as:  VIAGRA     Vitamin D3 10000 units Tabs      TAKE these medications        allopurinol 300 MG tablet  Commonly known as:  ZYLOPRIM  Take 300 mg by mouth daily.     HYDROcodone-acetaminophen 5-325 MG tablet  Commonly known as:  NORCO  Take 1-2 tablets by mouth every 6 (six) hours as needed for  moderate pain.     simvastatin 40 MG tablet  Commonly known as:  ZOCOR  Take 40 mg by mouth at bedtime.     sulfamethoxazole-trimethoprim 800-160 MG tablet  Commonly known as:  BACTRIM DS,SEPTRA DS  Take 1 tablet by mouth 2 (two) times daily. Start the day prior to foley removal appointment        Followup: He will followup in 1 week for catheter removal and to discuss his surgical pathology results.

## 2015-07-10 NOTE — Progress Notes (Addendum)
Patient is alert and oriented x4 and ambulatory .Discharge instruction reviewed, questions concerns denied. Additional teaching completed r/t foley care while at home. Patient/spouse preferred to not have foley changed over to leg bag. Pt discharged with supplies leg bag x 2 and overnight bag x1, gauze and tape for jp site.

## 2015-07-20 ENCOUNTER — Other Ambulatory Visit: Payer: Self-pay | Admitting: Family Medicine

## 2015-07-20 ENCOUNTER — Ambulatory Visit
Admission: RE | Admit: 2015-07-20 | Discharge: 2015-07-20 | Disposition: A | Discharge status: Home / Self Care | Payer: Medicare Other | Source: Ambulatory Visit | Attending: Family Medicine | Admitting: Family Medicine

## 2015-07-20 DIAGNOSIS — M79605 Pain in left leg: Secondary | ICD-10-CM

## 2015-07-29 ENCOUNTER — Other Ambulatory Visit: Payer: Self-pay | Admitting: Family Medicine

## 2015-07-29 DIAGNOSIS — M5416 Radiculopathy, lumbar region: Secondary | ICD-10-CM

## 2015-07-30 ENCOUNTER — Ambulatory Visit
Admission: RE | Admit: 2015-07-30 | Discharge: 2015-07-30 | Disposition: A | Discharge status: Home / Self Care | Payer: Medicare Other | Source: Ambulatory Visit | Attending: Family Medicine | Admitting: Family Medicine

## 2015-07-30 DIAGNOSIS — M5416 Radiculopathy, lumbar region: Secondary | ICD-10-CM

## 2015-08-01 ENCOUNTER — Other Ambulatory Visit: Payer: Medicare Other

## 2015-08-02 ENCOUNTER — Other Ambulatory Visit: Payer: Medicare Other

## 2015-08-03 ENCOUNTER — Other Ambulatory Visit: Payer: Self-pay | Admitting: Family Medicine

## 2015-08-03 DIAGNOSIS — M5416 Radiculopathy, lumbar region: Secondary | ICD-10-CM

## 2015-08-05 ENCOUNTER — Other Ambulatory Visit: Payer: Self-pay | Admitting: Family Medicine

## 2015-08-05 ENCOUNTER — Ambulatory Visit
Admission: RE | Admit: 2015-08-05 | Discharge: 2015-08-05 | Disposition: A | Discharge status: Home / Self Care | Payer: Medicare Other | Source: Ambulatory Visit | Attending: Family Medicine | Admitting: Family Medicine

## 2015-08-05 ENCOUNTER — Other Ambulatory Visit: Payer: Medicare Other

## 2015-08-05 DIAGNOSIS — M5416 Radiculopathy, lumbar region: Secondary | ICD-10-CM

## 2015-08-05 MED ORDER — IOHEXOL 180 MG/ML  SOLN
1.0000 mL | Freq: Once | INTRAMUSCULAR | Status: AC | PRN
  Administered 2015-08-05: 1 mL via EPIDURAL

## 2015-08-05 MED ORDER — METHYLPREDNISOLONE ACETATE 40 MG/ML INJ SUSP (RADIOLOG
120.0000 mg | Freq: Once | INTRAMUSCULAR | Status: AC
  Administered 2015-08-05: 120 mg via EPIDURAL

## 2015-08-05 NOTE — Discharge Instructions (Signed)

## 2015-08-14 ENCOUNTER — Other Ambulatory Visit: Payer: Self-pay | Admitting: Family Medicine

## 2015-08-14 DIAGNOSIS — M5416 Radiculopathy, lumbar region: Secondary | ICD-10-CM

## 2015-08-24 ENCOUNTER — Ambulatory Visit
Admission: RE | Admit: 2015-08-24 | Discharge: 2015-08-24 | Disposition: A | Discharge status: Home / Self Care | Payer: Medicare Other | Source: Ambulatory Visit | Attending: Family Medicine | Admitting: Family Medicine

## 2015-08-24 ENCOUNTER — Other Ambulatory Visit: Payer: Self-pay | Admitting: Family Medicine

## 2015-08-24 DIAGNOSIS — M5416 Radiculopathy, lumbar region: Secondary | ICD-10-CM

## 2015-08-24 MED ORDER — IOPAMIDOL (ISOVUE-M 200) INJECTION 41%
1.0000 mL | Freq: Once | INTRAMUSCULAR | Status: AC
  Administered 2015-08-24: 1 mL via EPIDURAL

## 2015-08-24 MED ORDER — METHYLPREDNISOLONE ACETATE 40 MG/ML INJ SUSP (RADIOLOG
120.0000 mg | Freq: Once | INTRAMUSCULAR | Status: AC
  Administered 2015-08-24: 120 mg via EPIDURAL

## 2015-10-08 ENCOUNTER — Other Ambulatory Visit: Payer: Self-pay | Admitting: Family Medicine

## 2015-10-08 DIAGNOSIS — R1310 Dysphagia, unspecified: Secondary | ICD-10-CM

## 2015-10-12 ENCOUNTER — Other Ambulatory Visit: Payer: Medicare Other

## 2015-10-13 ENCOUNTER — Telehealth: Payer: Self-pay | Admitting: Internal Medicine

## 2015-10-13 NOTE — Telephone Encounter (Signed)
New message   Pt is taking a daily Asprin   It causes bleeding when ever he gets a lil cut  He wants to know if there is anything else that he can take

## 2015-10-14 NOTE — Telephone Encounter (Signed)
Left message for patient that he could decrease his dose of aspirin and this may help but I do not know what he is currently taking.  I have asked he call me back if needed but there is nothing really to substitute for aspirin.

## 2015-10-15 ENCOUNTER — Ambulatory Visit
Admission: RE | Admit: 2015-10-15 | Discharge: 2015-10-15 | Disposition: A | Discharge status: Home / Self Care | Payer: Medicare Other | Source: Ambulatory Visit | Attending: Family Medicine | Admitting: Family Medicine

## 2015-10-15 DIAGNOSIS — R1310 Dysphagia, unspecified: Secondary | ICD-10-CM

## 2015-11-19 ENCOUNTER — Other Ambulatory Visit: Payer: Self-pay | Admitting: Gastroenterology

## 2015-11-19 DIAGNOSIS — K228 Other specified diseases of esophagus: Secondary | ICD-10-CM

## 2015-11-19 DIAGNOSIS — K2289 Other specified disease of esophagus: Secondary | ICD-10-CM

## 2015-11-25 ENCOUNTER — Ambulatory Visit
Admission: RE | Admit: 2015-11-25 | Discharge: 2015-11-25 | Disposition: A | Discharge status: Home / Self Care | Payer: Medicare Other | Source: Ambulatory Visit | Attending: Gastroenterology | Admitting: Gastroenterology

## 2015-11-25 DIAGNOSIS — K228 Other specified diseases of esophagus: Secondary | ICD-10-CM

## 2015-11-25 DIAGNOSIS — K2289 Other specified disease of esophagus: Secondary | ICD-10-CM

## 2015-11-25 MED ORDER — IOPAMIDOL (ISOVUE-300) INJECTION 61%
75.0000 mL | Freq: Once | INTRAVENOUS | Status: AC | PRN
  Administered 2015-11-25: 75 mL via INTRAVENOUS

## 2016-01-14 NOTE — Progress Notes (Signed)
Pt is being scheduled for preop appt; please place surgical orders in epic. Thanks.  

## 2016-01-15 ENCOUNTER — Ambulatory Visit: Payer: Self-pay | Admitting: Orthopedic Surgery

## 2016-02-17 NOTE — H&P (Signed)
TOTAL KNEE ADMISSION H&P  Patient is being admitted for right total knee arthroplasty.  Subjective:  Chief Complaint:right knee pain.  HPI: Zachary Sanchez, 70 y.o. male, has a history of pain and functional disability in the right knee due to arthritis and has failed non-surgical conservative treatments for greater than 12 weeks to includeNSAID's and/or analgesics, supervised PT with diminished ADL's post treatment, use of assistive devices, weight reduction as appropriate and activity modification.  Onset of symptoms was gradual, starting 3 years ago with gradually worsening course since that time. The patient noted no past surgery on the right knee(s).  Patient currently rates pain in the right knee(s) at 7 out of 10 with activity. Patient has worsening of pain with activity and weight bearing, pain that interferes with activities of daily living, pain with passive range of motion and joint swelling.  Patient has evidence of subchondral sclerosis, periarticular osteophytes and joint space narrowing by imaging studies. There is no active infection.  Patient Active Problem List   Diagnosis Date Noted  . Prostate cancer ([place]) 07/09/2015  . Malignant neoplasm of prostate ([place]) 06/08/2015  . Aortic root enlargement ([place]) 02/25/2013  . ATRIAL FIBRILLATION 12/12/2008  . COLONIC POLYPS 09/25/2008  . HYPERLIPIDEMIA 09/25/2008  . GOUT 09/25/2008  . ATRIAL FLUTTER 09/25/2008  . BENIGN PROSTATIC HYPERTROPHY, HX OF 09/25/2008   Past Medical History:  Diagnosis Date  . Aortic root enlargement (HCC)    aortic root 58mm in size  . Atrial flutter ([place])   . Biatrial enlargement   . Colonic polyp   . DJD (degenerative joint disease)   . Dysrhythmia   . Gout   . Heart murmur   . History of benign prostatic hypertrophy   . Hyperlipidemia   . Persistent atrial fibrillation (HCC)    longstanding persistent, asymptomatic  . Prostate cancer ([place])   . Wears glasses     Past Surgical History:   Procedure Laterality Date  . FRACTURE SURGERY     ankles and fingers  . KNEE SURGERY     right; menicus tear   . LYMPHADENECTOMY Bilateral 07/09/2015   Procedure: PELVIC LYMPHADENECTOMY;  Surgeon: Raynelle Bring, MD;  Location: [facility];  Service: Urology;  Laterality: Bilateral;  . ROBOT ASSISTED LAPAROSCOPIC RADICAL PROSTATECTOMY N/A 07/09/2015   Procedure: XI ROBOTIC ASSISTED LAPAROSCOPIC RADICAL PROSTATECTOMY LEVEL 2;  Surgeon: Raynelle Bring, MD;  Location: [facility];  Service: Urology;  Laterality: N/A;  . TONSILLECTOMY      No prescriptions prior to admission.   Allergies  Allergen Reactions  . Tamsulosin Other (See Comments)    Intolerance  . Toviaz [Fesoterodine Fumarate Er] Other (See Comments)    Intolerance    Social History  Substance Use Topics  . Smoking status: Former Smoker    Packs/day: 1.00    Years: 20.00    Types: Cigarettes    Quit date: 02/11/1979  . Smokeless tobacco: Never Used  . Alcohol use 12.6 oz/week    21 Glasses of wine per week     Comment: 2-3 glasses of wine daily    Family History  Problem Relation Age of Onset  . Heart attack Father   . Alcohol abuse Father     Heavy smoker and drinker  . Cancer Brother     base of tongue     Review of Systems  Constitutional: Negative.   HENT: Negative.   Eyes: Negative.   Respiratory: Negative.   Cardiovascular: Negative.   Gastrointestinal: Negative.   Genitourinary:  Negative.   Musculoskeletal: Positive for joint pain.  Skin: Negative.   Neurological: Negative.   Endo/Heme/Allergies: Negative.   Psychiatric/Behavioral: Negative.     Objective:  Physical Exam  Constitutional: He is oriented to person, place, and time. He appears well-developed.  HENT:  Head: Normocephalic.  Eyes: EOM are normal.  Neck: Normal range of motion.  Cardiovascular: Normal rate and intact distal pulses.   Respiratory: Effort normal and breath sounds normal.  GI: Soft.  Genitourinary:  Genitourinary  Comments: Deferred  Musculoskeletal:  Deferred  Neurological: He is alert and oriented to person, place, and time. He has normal reflexes.  Skin: Skin is dry.  Psychiatric: His behavior is normal.    Vital signs in last 24 hours:    Labs:   Estimated body mass index is 26.96 kg/m as calculated from the following:   Height as of 07/09/15: 6\' 2"  (1.88 m).   Weight as of 07/30/15: 95.3 kg (210 lb).   Imaging Review Plain radiographs demonstrate moderate degenerative joint disease of the right knee(s). The overall alignment ismild varus. The bone quality appears to be good for age and reported activity level.  Assessment/Plan:  End stage arthritis, right knee   The patient history, physical examination, clinical judgment of the provider and imaging studies are consistent with end stage degenerative joint disease of the right knee(s) and total knee arthroplasty is deemed medically necessary. The treatment options including medical management, injection therapy arthroscopy and arthroplasty were discussed at length. The risks and benefits of total knee arthroplasty were presented and reviewed. The risks due to aseptic loosening, infection, stiffness, patella tracking problems, thromboembolic complications and other imponderables were discussed. The patient acknowledged the explanation, agreed to proceed with the plan and consent was signed. Patient is being admitted for inpatient treatment for surgery, pain control, PT, OT, prophylactic antibiotics, VTE prophylaxis, progressive ambulation and ADL's and discharge planning. The patient is planning to be discharged home with home health services.  Will use IV tranexamic acid. Contraindications and adverse affects of Tranexamic acid discussed in detail. Patient denies any of these at this time and understands the risks and benefits.

## 2016-02-18 ENCOUNTER — Encounter (HOSPITAL_COMMUNITY): Payer: Self-pay

## 2016-02-22 NOTE — Patient Instructions (Signed)
Zachary Sanchez  02/22/2016   Your procedure is scheduled on: 03/04/2016    Report to [facility]  Entrance take [facility]  elevators to 3rd floor to  [place] at     2010pm.  Call this number if you have problems the morning of surgery [phone]   Remember: ONLY 1 PERSON MAY GO WITH YOU TO SHORT STAY TO GET  READY MORNING OF [place].  Do not eat food after midnite.  May have clear liquids from 12 midnite until 0900am morning of surgery then nothing by mouth.       Take these medicines the morning of surgery with A SIP OF WATER: Allopurinol ( zyloprim)                                You may not have any metal on your body including hair pins and              piercings  Do not wear jewelry,  lotions, powders or perfumes, deodorant                          Men may shave face and neck.   Do not bring valuables to the hospital. [place].  Contacts, dentures or bridgework may not be worn into surgery.  Leave suitcase in the car. After surgery it may be brought to your room.         Special Instructions: coughing and deep breathing exercises, leg exercises               Please read over the following fact sheets you were given: _____________________________________________________________________             [facility] - Preparing for Surgery Before surgery, you can play an important role.  Because skin is not sterile, your skin needs to be as free of germs as possible.  You can reduce the number of germs on your skin by washing with CHG (chlorahexidine gluconate) soap before surgery.  CHG is an antiseptic cleaner which kills germs and bonds with the skin to continue killing germs even after washing. Please DO NOT use if you have an allergy to CHG or antibacterial soaps.  If your skin becomes reddened/irritated stop using the CHG and inform your nurse when you arrive at Short Stay. Do not shave  (including legs and underarms) for at least 48 hours prior to the first CHG shower.  You may shave your face/neck. Please follow these instructions carefully:  1.  Shower with CHG Soap the night before surgery and the  morning of Surgery.  2.  If you choose to wash your hair, wash your hair first as usual with your  normal  shampoo.  3.  After you shampoo, rinse your hair and body thoroughly to remove the  shampoo.                           4.  Use CHG as you would any other liquid soap.  You can apply chg directly  to the skin and wash  Gently with a scrungie or clean washcloth.  5.  Apply the CHG Soap to your body ONLY FROM THE NECK DOWN.   Do not use on face/ open                           Wound or open sores. Avoid contact with eyes, ears mouth and genitals (private parts).                       Wash face,  Genitals (private parts) with your normal soap.             6.  Wash thoroughly, paying special attention to the area where your surgery  will be performed.  7.  Thoroughly rinse your body with warm water from the neck down.  8.  DO NOT shower/wash with your normal soap after using and rinsing off  the CHG Soap.                9.  Pat yourself dry with a clean towel.            10.  Wear clean pajamas.            11.  Place clean sheets on your bed the night of your first shower and do not  sleep with pets. Day of Surgery : Do not apply any lotions/deodorants the morning of surgery.  Please wear clean clothes to the hospital/surgery center.  FAILURE TO FOLLOW THESE INSTRUCTIONS MAY RESULT IN THE CANCELLATION OF YOUR SURGERY PATIENT SIGNATURE_________________________________  NURSE SIGNATURE__________________________________  ________________________________________________________________________    CLEAR LIQUID DIET   Foods Allowed                                                                     Foods Excluded  Coffee and tea, regular and decaf                              liquids that you cannot  Plain Jell-O in any flavor                                             see through such as: Fruit ices (not with fruit pulp)                                     milk, soups, orange juice  Iced Popsicles                                    All solid food Carbonated beverages, regular and diet                                    Cranberry, grape and apple juices Sports drinks like Gatorade Lightly seasoned clear broth or consume(fat free) Sugar, honey syrup  Sample Menu Breakfast                                Lunch                                     Supper Cranberry juice                    Beef broth                            Chicken broth Jell-O                                     Grape juice                           Apple juice Coffee or tea                        Jell-O                                      Popsicle                                                Coffee or tea                        Coffee or tea  _____________________________________________________________________   WHAT IS A BLOOD TRANSFUSION? Blood Transfusion Information  A transfusion is the replacement of blood or some of its parts. Blood is made up of multiple cells which provide different functions.  Red blood cells carry oxygen and are used for blood loss replacement.  White blood cells fight against infection.  Platelets control bleeding.  Plasma helps clot blood.  Other blood products are available for specialized needs, such as hemophilia or other clotting disorders. BEFORE THE TRANSFUSION  Who gives blood for transfusions?   Healthy volunteers who are fully evaluated to make sure their blood is safe. This is blood bank blood. Transfusion therapy is the safest it has ever been in the practice of medicine. Before blood is taken from a donor, a complete history is taken to make sure that person has no history of diseases nor engages in risky social behavior  (examples are intravenous drug use or sexual activity with multiple partners). The donor's travel history is screened to minimize risk of transmitting infections, such as malaria. The donated blood is tested for signs of infectious diseases, such as HIV and hepatitis. The blood is then tested to be sure it is compatible with you in order to minimize the chance of a transfusion reaction. If you or a relative donates blood, this is often done in anticipation of surgery and is not appropriate for emergency situations. It takes many days to process the donated blood. RISKS AND COMPLICATIONS Although transfusion therapy is very safe and saves many lives, the main dangers of transfusion include:  Getting an infectious disease.  Developing a transfusion reaction. This is an allergic reaction to something in the blood you were given. Every precaution is taken to prevent this. The decision to have a blood transfusion has been considered carefully by your caregiver before blood is given. Blood is not given unless the benefits outweigh the risks. AFTER THE TRANSFUSION  Right after receiving a blood transfusion, you will usually feel much better and more energetic. This is especially true if your red blood cells have gotten low (anemic). The transfusion raises the level of the red blood cells which carry oxygen, and this usually causes an energy increase.  The nurse administering the transfusion will monitor you carefully for complications. HOME CARE INSTRUCTIONS  No special instructions are needed after a transfusion. You may find your energy is better. Speak with your caregiver about any limitations on activity for underlying diseases you may have. SEEK MEDICAL CARE IF:   Your condition is not improving after your transfusion.  You develop redness or irritation at the intravenous (IV) site. SEEK IMMEDIATE MEDICAL CARE IF:  Any of the following symptoms occur over the next 12 hours:  Shaking  chills.  You have a temperature by mouth above 102 F (38.9 C), not controlled by medicine.  Chest, back, or muscle pain.  People around you feel you are not acting correctly or are confused.  Shortness of breath or difficulty breathing.  Dizziness and fainting.  You get a rash or develop hives.  You have a decrease in urine output.  Your urine turns a dark color or changes to pink, red, or brown. Any of the following symptoms occur over the next 10 days:  You have a temperature by mouth above 102 F (38.9 C), not controlled by medicine.  Shortness of breath.  Weakness after normal activity.  The white part of the eye turns yellow (jaundice).  You have a decrease in the amount of urine or are urinating less often.  Your urine turns a dark color or changes to pink, red, or brown. Document Released: 04/01/2000 Document Revised: 06/27/2011 Document Reviewed: 11/19/2007 ExitCare Patient Information 2014 [place].  _______________________________________________________________________  Incentive Spirometer  An incentive spirometer is a tool that can help keep your lungs clear and active. This tool measures how well you are filling your lungs with each breath. Taking long deep breaths may help reverse or decrease the chance of developing breathing (pulmonary) problems (especially infection) following:  A long period of time when you are unable to move or be active. BEFORE THE PROCEDURE   If the spirometer includes an indicator to show your best effort, your nurse or respiratory therapist will set it to a desired goal.  If possible, sit up straight or lean slightly forward. Try not to slouch.  Hold the incentive spirometer in an upright position. INSTRUCTIONS FOR USE  1. Sit on the edge of your bed if possible, or sit up as far as you can in bed or on a chair. 2. Hold the incentive spirometer in an upright position. 3. Breathe out normally. 4. Place the  mouthpiece in your mouth and seal your lips tightly around it. 5. Breathe in slowly and as deeply as possible, raising the piston or the ball toward the top of the column. 6. Hold your breath for 3-5 seconds or for as long as possible. Allow the piston or ball to fall to the bottom of the column. 7. Remove the mouthpiece from your mouth and breathe out normally.  8. Rest for a few seconds and repeat Steps 1 through 7 at least 10 times every 1-2 hours when you are awake. Take your time and take a few normal breaths between deep breaths. 9. The spirometer may include an indicator to show your best effort. Use the indicator as a goal to work toward during each repetition. 10. After each set of 10 deep breaths, practice coughing to be sure your lungs are clear. If you have an incision (the cut made at the time of surgery), support your incision when coughing by placing a pillow or rolled up towels firmly against it. Once you are able to get out of bed, walk around indoors and cough well. You may stop using the incentive spirometer when instructed by your caregiver.  RISKS AND COMPLICATIONS  Take your time so you do not get dizzy or light-headed.  If you are in pain, you may need to take or ask for pain medication before doing incentive spirometry. It is harder to take a deep breath if you are having pain. AFTER USE  Rest and breathe slowly and easily.  It can be helpful to keep track of a log of your progress. Your caregiver can provide you with a simple table to help with this. If you are using the spirometer at home, follow these instructions: [place] IF:   You are having difficultly using the spirometer.  You have trouble using the spirometer as often as instructed.  Your pain medication is not giving enough relief while using the spirometer.  You develop fever of 100.5 F (38.1 C) or higher. SEEK IMMEDIATE MEDICAL CARE IF:   You cough up bloody sputum that had not been present  before.  You develop fever of 102 F (38.9 C) or greater.  You develop worsening pain at or near the incision site. MAKE SURE YOU:   Understand these instructions.  Will watch your condition.  Will get help right away if you are not doing well or get worse. Document Released: 08/15/2006 Document Revised: 06/27/2011 Document Reviewed: 10/16/2006 [facility] Patient Information 2014 [place], [place].   ________________________________________________________________________

## 2016-02-23 ENCOUNTER — Encounter (HOSPITAL_COMMUNITY): Payer: Self-pay

## 2016-02-23 ENCOUNTER — Encounter (HOSPITAL_COMMUNITY)
Admission: RE | Admit: 2016-02-23 | Discharge: 2016-02-23 | Disposition: A | Discharge status: Home / Self Care | Payer: Medicare Other | Source: Ambulatory Visit | Attending: Specialist | Admitting: Specialist

## 2016-02-23 DIAGNOSIS — Z01812 Encounter for preprocedural laboratory examination: Secondary | ICD-10-CM | POA: Diagnosis present

## 2016-02-23 DIAGNOSIS — M1711 Unilateral primary osteoarthritis, right knee: Secondary | ICD-10-CM | POA: Diagnosis not present

## 2016-02-23 LAB — BASIC METABOLIC PANEL
ANION GAP: 6 (ref 5–15)
BUN: 17 mg/dL (ref 6–20)
CO2: 24 mmol/L (ref 22–32)
Calcium: 9.6 mg/dL (ref 8.9–10.3)
Chloride: 107 mmol/L (ref 101–111)
Creatinine, Ser: 0.81 mg/dL (ref 0.61–1.24)
GFR calc Af Amer: >60 mL/min (ref >60)
GFR calc non Af Amer: >60 mL/min (ref >60)
GLUCOSE: 101 mg/dL — AB (ref 65–99)
POTASSIUM: 4.3 mmol/L (ref 3.5–5.1)
Sodium: 137 mmol/L (ref 135–145)

## 2016-02-23 LAB — SURGICAL PCR SCREEN
MRSA, PCR: NEGATIVE
STAPHYLOCOCCUS AUREUS: NEGATIVE

## 2016-02-23 LAB — CBC
HEMATOCRIT: 39.2 % (ref 39.0–52.0)
HEMOGLOBIN: 13.8 g/dL (ref 13.0–17.0)
MCH: 34.4 pg — AB (ref 26.0–34.0)
MCHC: 35.2 g/dL (ref 30.0–36.0)
MCV: 97.8 fL (ref 78.0–100.0)
Platelets: 192 10*3/uL (ref 150–400)
RBC: 4.01 MIL/uL — ABNORMAL LOW (ref 4.22–5.81)
RDW: 12.9 % (ref 11.5–15.5)
WBC: 5.5 10*3/uL (ref 4.0–10.5)

## 2016-02-23 LAB — URINALYSIS, ROUTINE W REFLEX MICROSCOPIC
Bilirubin Urine: NEGATIVE
GLUCOSE, UA: NEGATIVE mg/dL
Hgb urine dipstick: NEGATIVE
KETONES UR: NEGATIVE mg/dL
NITRITE: NEGATIVE
PROTEIN: NEGATIVE mg/dL
Specific Gravity, Urine: 1.026 (ref 1.005–1.030)
pH: 7.5 (ref 5.0–8.0)

## 2016-02-23 LAB — URINE MICROSCOPIC-ADD ON: RBC / HPF: NONE SEEN RBC/hpf (ref 0–5)

## 2016-02-23 LAB — PROTIME-INR
INR: 1.03
Prothrombin Time: 13.6 seconds (ref 11.4–15.2)

## 2016-02-23 LAB — APTT: APTT: 26 s (ref 24–36)

## 2016-02-23 NOTE — Progress Notes (Signed)
EKG- 07/06/15- [facility]  07/06/15- [place]  11/25/15- [facility]- [facility]  07/06/15- [facility]OV- Cardiology- [facility] and on chart along with clearance from Dr Rayann Heman  Clearance - Dr Sandi Mariscal - on chart with [facility]- 01/06/16- on chart

## 2016-02-23 NOTE — Progress Notes (Signed)
U/A and micro results of 02/23/16 faxed via [facility] to Dr Theda Sers.

## 2016-03-04 ENCOUNTER — Encounter (HOSPITAL_COMMUNITY): Payer: Self-pay | Admitting: *Deleted

## 2016-03-04 ENCOUNTER — Inpatient Hospital Stay (HOSPITAL_COMMUNITY): Payer: Medicare Other | Admitting: Anesthesiology

## 2016-03-04 ENCOUNTER — Encounter (HOSPITAL_COMMUNITY)
Admission: RE | Disposition: A | Discharge status: Home Health | Payer: Self-pay | Source: Ambulatory Visit | Attending: Specialist

## 2016-03-04 ENCOUNTER — Inpatient Hospital Stay (HOSPITAL_COMMUNITY)
Admission: RE | Admit: 2016-03-04 | Discharge: 2016-03-06 | DRG: 470 | Disposition: A | Discharge status: Home Health | Payer: Medicare Other | Source: Ambulatory Visit | Attending: Specialist | Admitting: Specialist

## 2016-03-04 DIAGNOSIS — I4892 Unspecified atrial flutter: Secondary | ICD-10-CM | POA: Diagnosis not present

## 2016-03-04 DIAGNOSIS — Z888 Allergy status to other drugs, medicaments and biological substances status: Secondary | ICD-10-CM | POA: Diagnosis not present

## 2016-03-04 DIAGNOSIS — M1711 Unilateral primary osteoarthritis, right knee: Principal | ICD-10-CM | POA: Diagnosis present

## 2016-03-04 DIAGNOSIS — M109 Gout, unspecified: Secondary | ICD-10-CM | POA: Diagnosis present

## 2016-03-04 DIAGNOSIS — Z87891 Personal history of nicotine dependence: Secondary | ICD-10-CM

## 2016-03-04 DIAGNOSIS — Z86010 Personal history of colonic polyps: Secondary | ICD-10-CM

## 2016-03-04 DIAGNOSIS — Z811 Family history of alcohol abuse and dependence: Secondary | ICD-10-CM

## 2016-03-04 DIAGNOSIS — E785 Hyperlipidemia, unspecified: Secondary | ICD-10-CM | POA: Diagnosis not present

## 2016-03-04 DIAGNOSIS — Z9079 Acquired absence of other genital organ(s): Secondary | ICD-10-CM | POA: Diagnosis not present

## 2016-03-04 DIAGNOSIS — Z973 Presence of spectacles and contact lenses: Secondary | ICD-10-CM | POA: Diagnosis not present

## 2016-03-04 DIAGNOSIS — Z8546 Personal history of malignant neoplasm of prostate: Secondary | ICD-10-CM | POA: Diagnosis not present

## 2016-03-04 DIAGNOSIS — R011 Cardiac murmur, unspecified: Secondary | ICD-10-CM | POA: Diagnosis not present

## 2016-03-04 DIAGNOSIS — Z8249 Family history of ischemic heart disease and other diseases of the circulatory system: Secondary | ICD-10-CM | POA: Diagnosis not present

## 2016-03-04 DIAGNOSIS — M25561 Pain in right knee: Secondary | ICD-10-CM | POA: Diagnosis present

## 2016-03-04 DIAGNOSIS — I481 Persistent atrial fibrillation: Secondary | ICD-10-CM | POA: Diagnosis present

## 2016-03-04 DIAGNOSIS — Z96659 Presence of unspecified artificial knee joint: Secondary | ICD-10-CM

## 2016-03-04 DIAGNOSIS — Z800 Family history of malignant neoplasm of digestive organs: Secondary | ICD-10-CM | POA: Diagnosis not present

## 2016-03-04 HISTORY — PX: TOTAL KNEE ARTHROPLASTY: SHX125

## 2016-03-04 LAB — TYPE AND SCREEN
ABO/RH(D): B POS
Antibody Screen: NEGATIVE

## 2016-03-04 SURGERY — ARTHROPLASTY, KNEE, TOTAL
Anesthesia: Regional | Site: Knee | Laterality: Right

## 2016-03-04 MED ORDER — FERROUS SULFATE 325 (65 FE) MG PO TABS
325.0000 mg | ORAL_TABLET | ORAL | Status: DC
  Administered 2016-03-05 – 2016-03-06 (×3): 325 mg via ORAL
  Filled 2016-03-04 (×4): qty 1

## 2016-03-04 MED ORDER — ROPIVACAINE HCL 7.5 MG/ML IJ SOLN
INTRAMUSCULAR | Status: AC
  Filled 2016-03-04: qty 20

## 2016-03-04 MED ORDER — ALUM & MAG HYDROXIDE-SIMETH 200-200-20 MG/5ML PO SUSP: 30.0000 mL | ORAL | Status: DC | PRN

## 2016-03-04 MED ORDER — LACTATED RINGERS IV SOLN: INTRAVENOUS | Status: DC

## 2016-03-04 MED ORDER — ENOXAPARIN SODIUM 30 MG/0.3ML ~~LOC~~ SOLN
30.0000 mg | Freq: Two times a day (BID) | SUBCUTANEOUS | Status: DC
  Administered 2016-03-05 – 2016-03-06 (×3): 30 mg via SUBCUTANEOUS
  Filled 2016-03-04 (×3): qty 0.3

## 2016-03-04 MED ORDER — ONDANSETRON HCL 4 MG PO TABS: 4.0000 mg | ORAL_TABLET | Freq: Four times a day (QID) | ORAL | Status: DC | PRN

## 2016-03-04 MED ORDER — FENTANYL CITRATE (PF) 100 MCG/2ML IJ SOLN
INTRAMUSCULAR | Status: AC
  Filled 2016-03-04: qty 2

## 2016-03-04 MED ORDER — PROMETHAZINE HCL 25 MG/ML IJ SOLN
6.2500 mg | INTRAMUSCULAR | Status: DC | PRN
Start: 2016-03-04 — End: 2016-03-04

## 2016-03-04 MED ORDER — OXYCODONE HCL 5 MG PO TABS
5.0000 mg | ORAL_TABLET | ORAL | Status: DC | PRN
  Administered 2016-03-04 (×2): 5 mg via ORAL
  Administered 2016-03-05 (×3): 10 mg via ORAL
  Administered 2016-03-05: 5 mg via ORAL
  Administered 2016-03-05 – 2016-03-06 (×4): 10 mg via ORAL
  Filled 2016-03-04: qty 2
  Filled 2016-03-04: qty 1
  Filled 2016-03-04: qty 2
  Filled 2016-03-04: qty 1
  Filled 2016-03-04 (×2): qty 2
  Filled 2016-03-04: qty 1
  Filled 2016-03-04: qty 2
  Filled 2016-03-04 (×2): qty 1
  Filled 2016-03-04 (×2): qty 2

## 2016-03-04 MED ORDER — ACETAMINOPHEN 325 MG PO TABS
650.0000 mg | ORAL_TABLET | Freq: Four times a day (QID) | ORAL | Status: DC | PRN
  Administered 2016-03-06: 650 mg via ORAL
  Filled 2016-03-04: qty 2

## 2016-03-04 MED ORDER — GLYCOPYRROLATE 0.2 MG/ML IV SOSY
PREFILLED_SYRINGE | INTRAVENOUS | Status: AC
  Filled 2016-03-04: qty 3

## 2016-03-04 MED ORDER — KETOROLAC TROMETHAMINE 30 MG/ML IJ SOLN
INTRAMUSCULAR | Status: AC
  Filled 2016-03-04: qty 1

## 2016-03-04 MED ORDER — ONDANSETRON HCL 4 MG/2ML IJ SOLN
INTRAMUSCULAR | Status: AC
  Filled 2016-03-04: qty 2

## 2016-03-04 MED ORDER — ACETAMINOPHEN 10 MG/ML IV SOLN
INTRAVENOUS | Status: DC | PRN
  Administered 2016-03-04: 1000 mg via INTRAVENOUS

## 2016-03-04 MED ORDER — FENTANYL CITRATE (PF) 100 MCG/2ML IJ SOLN
INTRAMUSCULAR | Status: DC | PRN
  Administered 2016-03-04: 100 ug via INTRAVENOUS

## 2016-03-04 MED ORDER — SODIUM CHLORIDE 0.9 % IR SOLN
Status: DC | PRN
  Administered 2016-03-04: 1000 mL

## 2016-03-04 MED ORDER — PANTOPRAZOLE SODIUM 40 MG PO TBEC
40.0000 mg | DELAYED_RELEASE_TABLET | Freq: Every day | ORAL | Status: DC
  Administered 2016-03-05 – 2016-03-06 (×2): 40 mg via ORAL
  Filled 2016-03-04 (×2): qty 1

## 2016-03-04 MED ORDER — ONDANSETRON HCL 4 MG/2ML IJ SOLN: 4.0000 mg | Freq: Four times a day (QID) | INTRAMUSCULAR | Status: DC | PRN

## 2016-03-04 MED ORDER — MIDAZOLAM HCL 2 MG/2ML IJ SOLN
INTRAMUSCULAR | Status: AC
  Filled 2016-03-04: qty 2

## 2016-03-04 MED ORDER — METOCLOPRAMIDE HCL 5 MG PO TABS: 5.0000 mg | ORAL_TABLET | Freq: Three times a day (TID) | ORAL | Status: DC | PRN

## 2016-03-04 MED ORDER — GLYCOPYRROLATE 0.2 MG/ML IJ SOLN
INTRAMUSCULAR | Status: DC | PRN
  Administered 2016-03-04: 0.2 mg via INTRAVENOUS

## 2016-03-04 MED ORDER — MENTHOL 3 MG MT LOZG: 1.0000 | LOZENGE | OROMUCOSAL | Status: DC | PRN

## 2016-03-04 MED ORDER — DEXAMETHASONE SODIUM PHOSPHATE 10 MG/ML IJ SOLN
10.0000 mg | Freq: Once | INTRAMUSCULAR | Status: AC
  Administered 2016-03-04: 10 mg via INTRAVENOUS

## 2016-03-04 MED ORDER — BISACODYL 5 MG PO TBEC: 5.0000 mg | DELAYED_RELEASE_TABLET | Freq: Every day | ORAL | Status: DC | PRN

## 2016-03-04 MED ORDER — BUPIVACAINE HCL (PF) 0.25 % IJ SOLN
INTRAMUSCULAR | Status: DC | PRN
  Administered 2016-03-04: 30 mL

## 2016-03-04 MED ORDER — ACETAMINOPHEN 10 MG/ML IV SOLN
INTRAVENOUS | Status: AC
  Filled 2016-03-04: qty 100

## 2016-03-04 MED ORDER — CEFAZOLIN SODIUM-DEXTROSE 2-4 GM/100ML-% IV SOLN
2.0000 g | Freq: Four times a day (QID) | INTRAVENOUS | Status: AC
  Administered 2016-03-04 – 2016-03-05 (×2): 2 g via INTRAVENOUS
  Filled 2016-03-04 (×2): qty 100

## 2016-03-04 MED ORDER — METHOCARBAMOL 500 MG PO TABS
500.0000 mg | ORAL_TABLET | Freq: Four times a day (QID) | ORAL | Status: DC | PRN
  Administered 2016-03-05 – 2016-03-06 (×4): 500 mg via ORAL
  Filled 2016-03-04 (×4): qty 1

## 2016-03-04 MED ORDER — CEFAZOLIN SODIUM-DEXTROSE 2-4 GM/100ML-% IV SOLN
2.0000 g | INTRAVENOUS | Status: AC
  Administered 2016-03-04: 2 g via INTRAVENOUS
  Filled 2016-03-04: qty 100

## 2016-03-04 MED ORDER — ACETAMINOPHEN 650 MG RE SUPP: 650.0000 mg | Freq: Four times a day (QID) | RECTAL | Status: DC | PRN

## 2016-03-04 MED ORDER — SODIUM CHLORIDE 0.9 % IV SOLN
1000.0000 mg | INTRAVENOUS | Status: AC
  Administered 2016-03-04: 1000 mg via INTRAVENOUS
  Filled 2016-03-04: qty 1100

## 2016-03-04 MED ORDER — HYDROMORPHONE HCL 1 MG/ML IJ SOLN: 0.2500 mg | INTRAMUSCULAR | Status: DC | PRN

## 2016-03-04 MED ORDER — HYDROMORPHONE HCL 1 MG/ML IJ SOLN
1.0000 mg | INTRAMUSCULAR | Status: DC | PRN
  Administered 2016-03-05 (×3): 1 mg via INTRAVENOUS
  Filled 2016-03-04 (×3): qty 1

## 2016-03-04 MED ORDER — MEPERIDINE HCL 50 MG/ML IJ SOLN: 6.2500 mg | INTRAMUSCULAR | Status: DC | PRN

## 2016-03-04 MED ORDER — DIPHENHYDRAMINE HCL 12.5 MG/5ML PO ELIX: 12.5000 mg | ORAL_SOLUTION | ORAL | Status: DC | PRN

## 2016-03-04 MED ORDER — ROPIVACAINE HCL 7.5 MG/ML IJ SOLN
INTRAMUSCULAR | Status: DC | PRN
  Administered 2016-03-04: 20 mL via PERINEURAL

## 2016-03-04 MED ORDER — KETOROLAC TROMETHAMINE 30 MG/ML IJ SOLN
INTRAMUSCULAR | Status: DC | PRN
  Administered 2016-03-04: 30 mg via INTRAVENOUS

## 2016-03-04 MED ORDER — PROPOFOL 10 MG/ML IV BOLUS
INTRAVENOUS | Status: AC
  Filled 2016-03-04: qty 20

## 2016-03-04 MED ORDER — SIMVASTATIN 20 MG PO TABS
40.0000 mg | ORAL_TABLET | Freq: Every day | ORAL | Status: DC
  Administered 2016-03-05: 40 mg via ORAL
  Filled 2016-03-04: qty 1
  Filled 2016-03-04: qty 2

## 2016-03-04 MED ORDER — POLYETHYLENE GLYCOL 3350 17 G PO PACK
17.0000 g | PACK | Freq: Every day | ORAL | Status: DC | PRN
  Administered 2016-03-05 – 2016-03-06 (×2): 17 g via ORAL
  Filled 2016-03-04 (×2): qty 1

## 2016-03-04 MED ORDER — PHENOL 1.4 % MT LIQD: 1.0000 | OROMUCOSAL | Status: DC | PRN

## 2016-03-04 MED ORDER — POVIDONE-IODINE 7.5 % EX SOLN: Freq: Once | CUTANEOUS | Status: DC

## 2016-03-04 MED ORDER — FENTANYL CITRATE (PF) 100 MCG/2ML IJ SOLN
100.0000 ug | Freq: Once | INTRAMUSCULAR | Status: AC
  Administered 2016-03-04: 100 ug via INTRAVENOUS

## 2016-03-04 MED ORDER — BUPIVACAINE IN DEXTROSE 0.75-8.25 % IT SOLN
INTRATHECAL | Status: DC | PRN
  Administered 2016-03-04: 2 mL via INTRATHECAL

## 2016-03-04 MED ORDER — DOCUSATE SODIUM 100 MG PO CAPS
100.0000 mg | ORAL_CAPSULE | Freq: Two times a day (BID) | ORAL | Status: DC
  Administered 2016-03-04 – 2016-03-06 (×4): 100 mg via ORAL
  Filled 2016-03-04 (×4): qty 1

## 2016-03-04 MED ORDER — METOCLOPRAMIDE HCL 5 MG/ML IJ SOLN: 5.0000 mg | Freq: Three times a day (TID) | INTRAMUSCULAR | Status: DC | PRN

## 2016-03-04 MED ORDER — PROPOFOL 10 MG/ML IV BOLUS
INTRAVENOUS | Status: AC
  Filled 2016-03-04: qty 60

## 2016-03-04 MED ORDER — DEXAMETHASONE SODIUM PHOSPHATE 10 MG/ML IJ SOLN
INTRAMUSCULAR | Status: AC
  Filled 2016-03-04: qty 1

## 2016-03-04 MED ORDER — DEXAMETHASONE SODIUM PHOSPHATE 10 MG/ML IJ SOLN
10.0000 mg | Freq: Once | INTRAMUSCULAR | Status: AC
  Administered 2016-03-05: 10 mg via INTRAVENOUS
  Filled 2016-03-04: qty 1

## 2016-03-04 MED ORDER — MAGNESIUM CITRATE PO SOLN
1.0000 | Freq: Once | ORAL | Status: DC | PRN
Start: 2016-03-04 — End: 2016-03-06

## 2016-03-04 MED ORDER — BUPIVACAINE HCL (PF) 0.25 % IJ SOLN
INTRAMUSCULAR | Status: AC
  Filled 2016-03-04: qty 30

## 2016-03-04 MED ORDER — LACTATED RINGERS IV SOLN
INTRAVENOUS | Status: DC
  Administered 2016-03-04 (×3): via INTRAVENOUS

## 2016-03-04 MED ORDER — METHOCARBAMOL 1000 MG/10ML IJ SOLN
500.0000 mg | Freq: Four times a day (QID) | INTRAVENOUS | Status: DC | PRN
  Administered 2016-03-04: 500 mg via INTRAVENOUS
  Filled 2016-03-04: qty 550
  Filled 2016-03-04: qty 5

## 2016-03-04 MED ORDER — BIS SUBCIT-METRONID-TETRACYC 140-125-125 MG PO CAPS
3.0000 | ORAL_CAPSULE | Freq: Three times a day (TID) | ORAL | Status: DC
  Administered 2016-03-04 – 2016-03-06 (×6): 3 via ORAL

## 2016-03-04 MED ORDER — ONDANSETRON HCL 4 MG/2ML IJ SOLN
INTRAMUSCULAR | Status: DC | PRN
  Administered 2016-03-04: 4 mg via INTRAVENOUS

## 2016-03-04 MED ORDER — DEXTROSE 5 % IV SOLN
INTRAVENOUS | Status: DC | PRN
  Administered 2016-03-04: 30 ug/min via INTRAVENOUS

## 2016-03-04 MED ORDER — ALLOPURINOL 300 MG PO TABS
300.0000 mg | ORAL_TABLET | Freq: Every day | ORAL | Status: DC
  Administered 2016-03-05 – 2016-03-06 (×2): 300 mg via ORAL
  Filled 2016-03-04 (×2): qty 1

## 2016-03-04 MED ORDER — SODIUM CHLORIDE 0.9 % IJ SOLN
INTRAMUSCULAR | Status: DC | PRN
  Administered 2016-03-04: 30 mL via INTRAVENOUS

## 2016-03-04 MED ORDER — PROPOFOL 500 MG/50ML IV EMUL
INTRAVENOUS | Status: DC | PRN
  Administered 2016-03-04: 100 ug/kg/min via INTRAVENOUS

## 2016-03-04 MED ORDER — PHENYLEPHRINE HCL 10 MG/ML IJ SOLN
INTRAMUSCULAR | Status: AC
  Filled 2016-03-04: qty 1

## 2016-03-04 MED ORDER — MIDAZOLAM HCL 5 MG/5ML IJ SOLN
INTRAMUSCULAR | Status: DC | PRN
  Administered 2016-03-04: 2 mg via INTRAVENOUS

## 2016-03-04 MED ORDER — ZOLPIDEM TARTRATE 5 MG PO TABS: 5.0000 mg | ORAL_TABLET | Freq: Every evening | ORAL | Status: DC | PRN

## 2016-03-04 MED ORDER — CEFAZOLIN SODIUM-DEXTROSE 2-4 GM/100ML-% IV SOLN
INTRAVENOUS | Status: AC
  Filled 2016-03-04: qty 100

## 2016-03-04 MED ORDER — SODIUM CHLORIDE 0.9 % IJ SOLN
INTRAMUSCULAR | Status: AC
  Filled 2016-03-04: qty 50

## 2016-03-04 SURGICAL SUPPLY — 59 items
BAG DECANTER FOR FLEXI CONT (MISCELLANEOUS) IMPLANT
BAG ZIPLOCK 12X15 (MISCELLANEOUS) IMPLANT
BANDAGE ACE 4X5 VEL STRL LF (GAUZE/BANDAGES/DRESSINGS) ×3 IMPLANT
BANDAGE ACE 6X5 VEL STRL LF (GAUZE/BANDAGES/DRESSINGS) ×3 IMPLANT
BANDAGE ELASTIC 4 VELCRO ST LF (GAUZE/BANDAGES/DRESSINGS) ×3 IMPLANT
BANDAGE ELASTIC 6 VELCRO ST LF (GAUZE/BANDAGES/DRESSINGS) ×3 IMPLANT
BLADE SAG 18X100X1.27 (BLADE) ×3 IMPLANT
BLADE SAW SGTL 13.0X1.19X90.0M (BLADE) ×3 IMPLANT
CAP KNEE TOTAL 3 SIGMA ×3 IMPLANT
CEMENT HV SMART SET (Cement) ×3 IMPLANT
CLOTH BEACON ORANGE TIMEOUT ST (SAFETY) ×3 IMPLANT
CUFF TOURN SGL QUICK 34 (TOURNIQUET CUFF) ×2
CUFF TRNQT CYL 34X4X40X1 (TOURNIQUET CUFF) ×1 IMPLANT
DECANTER SPIKE VIAL GLASS SM (MISCELLANEOUS) ×3 IMPLANT
DERMABOND ADVANCED (GAUZE/BANDAGES/DRESSINGS) ×2
DERMABOND ADVANCED .7 DNX12 (GAUZE/BANDAGES/DRESSINGS) ×1 IMPLANT
DRAPE U-SHAPE 47X51 STRL (DRAPES) ×3 IMPLANT
DRSG AQUACEL AG ADV 3.5X10 (GAUZE/BANDAGES/DRESSINGS) ×3 IMPLANT
DRSG TEGADERM 4X4.75 (GAUZE/BANDAGES/DRESSINGS) ×3 IMPLANT
DURAPREP 26ML APPLICATOR (WOUND CARE) ×6 IMPLANT
ELECT REM PT RETURN 9FT ADLT (ELECTROSURGICAL) ×3
ELECTRODE REM PT RTRN 9FT ADLT (ELECTROSURGICAL) ×1 IMPLANT
EVACUATOR 1/8 PVC DRAIN (DRAIN) ×3 IMPLANT
GAUZE SPONGE 2X2 8PLY STRL LF (GAUZE/BANDAGES/DRESSINGS) ×1 IMPLANT
GLOVE BIOGEL PI IND STRL 8 (GLOVE) ×2 IMPLANT
GLOVE BIOGEL PI INDICATOR 8 (GLOVE) ×4
GLOVE ECLIPSE 8.0 STRL XLNG CF (GLOVE) ×6 IMPLANT
GLOVE SURG ORTHO 9.0 STRL STRW (GLOVE) ×3 IMPLANT
GLOVE SURG SS PI 7.5 STRL IVOR (GLOVE) ×3 IMPLANT
GOWN STRL REUS W/TWL XL LVL3 (GOWN DISPOSABLE) ×12 IMPLANT
HANDPIECE INTERPULSE COAX TIP (DISPOSABLE) ×2
IMMOBILIZER KNEE 20 (SOFTGOODS) ×6 IMPLANT
IMMOBILIZER KNEE 20 THIGH 36 (SOFTGOODS) ×1 IMPLANT
NS IRRIG 1000ML POUR BTL (IV SOLUTION) ×3 IMPLANT
PACK TOTAL KNEE CUSTOM (KITS) ×3 IMPLANT
POSITIONER SURGICAL ARM (MISCELLANEOUS) ×3 IMPLANT
SET HNDPC FAN SPRY TIP SCT (DISPOSABLE) ×1 IMPLANT
SET PAD KNEE POSITIONER (MISCELLANEOUS) ×3 IMPLANT
SPONGE GAUZE 2X2 STER 10/PKG (GAUZE/BANDAGES/DRESSINGS) ×2
SPONGE LAP 18X18 X RAY DECT (DISPOSABLE) IMPLANT
SPONGE SURGIFOAM ABS GEL 100 (HEMOSTASIS) ×3 IMPLANT
STOCKINETTE 6  STRL (DRAPES) ×2
STOCKINETTE 6 STRL (DRAPES) ×1 IMPLANT
SUCTION FRAZIER HANDLE 12FR (TUBING) ×2
SUCTION TUBE FRAZIER 12FR DISP (TUBING) ×1 IMPLANT
SUT BONE WAX W31G (SUTURE) IMPLANT
SUT MNCRL AB 3-0 PS2 18 (SUTURE) ×3 IMPLANT
SUT VIC AB 1 CT1 27 (SUTURE) ×8
SUT VIC AB 1 CT1 27XBRD ANTBC (SUTURE) ×4 IMPLANT
SUT VIC AB 2-0 CT1 27 (SUTURE) ×6
SUT VIC AB 2-0 CT1 TAPERPNT 27 (SUTURE) ×2 IMPLANT
SUT VLOC 180 0 24IN GS25 (SUTURE) ×3 IMPLANT
SYR 50ML LL SCALE MARK (SYRINGE) ×3 IMPLANT
TAPE STRIPS DRAPE STRL (GAUZE/BANDAGES/DRESSINGS) ×3 IMPLANT
TOWER CARTRIDGE SMART MIX (DISPOSABLE) ×3 IMPLANT
TRAY FOLEY W/METER SILVER 16FR (SET/KITS/TRAYS/PACK) ×3 IMPLANT
WATER STERILE IRR 1500ML POUR (IV SOLUTION) ×6 IMPLANT
WRAP KNEE MAXI GEL POST OP (GAUZE/BANDAGES/DRESSINGS) ×3 IMPLANT
YANKAUER SUCT BULB TIP 10FT TU (MISCELLANEOUS) ×3 IMPLANT

## 2016-03-04 NOTE — Interval H&P Note (Signed)
History and Physical Interval Note:  03/04/2016 3:50 PM  Zachary Sanchez  has presented today for surgery, with the diagnosis of OA right knee  The various methods of treatment have been discussed with the patient and family. After consideration of risks, benefits and other options for treatment, the patient has consented to  Procedure(s): RIGHT TOTAL KNEE ARTHROPLASTY (Right) as a surgical intervention .  The patient's history has been reviewed, patient examined, no change in status, stable for surgery.  I have reviewed the patient's chart and labs.  Questions were answered to the patient's satisfaction.     Lenni Reckner ANDREW

## 2016-03-04 NOTE — Op Note (Signed)
DATE OF SURGERY:  03/04/2016  TIME: 6:38 PM  PATIENT NAME:  Zachary Sanchez    AGE: 70 y.o.   PRE-OPERATIVE DIAGNOSIS:  OA right knee  POST-OPERATIVE DIAGNOSIS:  OA right knee  PROCEDURE:  Procedure(s): RIGHT TOTAL KNEE ARTHROPLASTY  SURGEON:  Skipper Dacosta ANDREW  ASSISTANT:  Bryson Stilwell, PA-C, present and scrubbed throughout the case, critical for assistance with exposure, retraction, instrumentation, and closure.  OPERATIVE IMPLANTS: Depuy PFC Sigma Rotating Platform.  Femur size 6, Tibia size 5, Patella size 41 3-peg oval button, with a 10 mm polyethylene insert.   PREOPERATIVE INDICATIONS:   Zachary Sanchez is a 70 y.o. year old male with end stage bone on bone arthritis of the knee who failed conservative treatment and elected for Total Knee Arthroplasty.   The risks, benefits, and alternatives were discussed at length including but not limited to the risks of infection, bleeding, nerve injury, stiffness, blood clots, the need for revision surgery, cardiopulmonary complications, among others, and they were willing to proceed.  OPERATIVE DESCRIPTION:  The patient was brought to the operative room and placed in a supine position.  Spinal anesthesia was administered.  IV antibiotics were given.  The lower extremity was prepped and draped in the usual sterile fashion.  Time out was performed.  The leg was elevated and exsanguinated and the tourniquet was inflated.  Anterior quadriceps tendon splitting approach was performed.  The patella was retracted and osteophytes were removed.  The anterior horn of the medial and lateral meniscus was removed and cruciate ligaments resected.   The distal femur was opened with the drill and the intramedullary distal femoral cutting jig was utilized, set at 5 degrees resecting 10 mm off the distal femur.  Care was taken to protect the collateral ligaments.  The distal femoral sizing jig was applied, taking care to avoid notching.  Then the  4-in-1 cutting jig was applied and the anterior and posterior femur was cut, along with the chamfer cuts.    Then the extramedullary tibial cutting jig was utilized making the appropriate cut using the anterior tibial crest as a reference building in appropriate posterior slope.  Care was taken during the cut to protect the medial and collateral ligaments.  The proximal tibia was removed along with the posterior horns of the menisci.   The posterior medial femoral osteophytes and posterior lateral femoral osteophytes were removed.    The flexion gap was then measured and was symmetric with the extension gap, measured at 10.  I completed the distal femoral preparation using the appropriate jig to prepare the box.  The patella was then measured, and cut with the saw.    The proximal tibia sized and prepared accordingly with the reamer and the punch, and then all components were trialed with the trial insert.  The knee was found to have excellent balance and full motion.    The above named components were then cemented into place and all excess cement was removed.  The trial polyethylene component was in place during cementation, and then was exchanged for the real polyethylene component.    The knee was easily taken through a range of motion and the patella tracked well and the knee irrigated copiously and the parapatellar and subcutaneous tissue closed with vicryl, and monocryl with steri strips for the skin.  The arthrotomy was closed at 90 of flexion. The wounds were dressed with sterile gauze and the tourniquet released and the patient was awakened and returned to the PACU  in stable and satisfactory condition.  There were no complications.  Total tourniquet time was 86 minutes.

## 2016-03-04 NOTE — Transfer of Care (Signed)
Immediate Anesthesia Transfer of Care Note  Patient: Zachary Sanchez  Procedure(s) Performed: Procedure(s): RIGHT TOTAL KNEE ARTHROPLASTY (Right)  Patient Location: PACU  Anesthesia Type:Regional and Spinal  Level of Consciousness: sedated  Airway & Oxygen Therapy: Patient Spontanous Breathing and Patient connected to face mask oxygen  Post-op Assessment: Report given to RN and Post -op Vital signs reviewed and stable  Post vital signs: Reviewed and stable  Last Vitals:  Vitals:   03/04/16 1611 03/04/16 1612  BP: 110/75   Pulse: 62 (!) 59  Resp: 10 14  Temp:      Last Pain:  Vitals:   03/04/16 1424  TempSrc: Oral      Patients Stated Pain Goal: 4 ([id number])  Complications: No apparent anesthesia complications

## 2016-03-04 NOTE — Progress Notes (Signed)
Assisted Dr. Germeroth with right, ultrasound guided, adductor canal block. Side rails up, monitors on throughout procedure. See vital signs in flow sheet. Tolerated Procedure well. 

## 2016-03-04 NOTE — Anesthesia Preprocedure Evaluation (Signed)
Anesthesia Evaluation  Patient identified by MRN, date of birth, ID band Patient awake    Reviewed: Allergy & Precautions, H&P , NPO status , Patient's Chart, lab work & pertinent test results  Airway Mallampati: II  TM Distance: >3 FB Neck ROM: full    Dental no notable dental hx. (+) Dental Advisory Given, Teeth Intact   Pulmonary neg pulmonary ROS, former smoker,    Pulmonary exam normal breath sounds clear to auscultation       Cardiovascular Exercise Tolerance: Good negative cardio ROS Normal cardiovascular exam+ dysrhythmias Atrial Fibrillation  Rhythm:regular Rate:Normal  ECG - AF   Neuro/Psych negative neurological ROS  negative psych ROS   GI/Hepatic negative GI ROS, Neg liver ROS,   Endo/Other  negative endocrine ROS  Renal/GU negative Renal ROS     Musculoskeletal  (+) Arthritis ,   Abdominal   Peds  Hematology negative hematology ROS (+)   Anesthesia Other Findings   Reproductive/Obstetrics negative OB ROS                             Anesthesia Physical  Anesthesia Plan  ASA: III  Anesthesia Plan: Spinal and Regional   Post-op Pain Management:  Regional for Post-op pain   Induction:   Airway Management Planned:   Additional Equipment:   Intra-op Plan:   Post-operative Plan:   Informed Consent: I have reviewed the patients History and Physical, chart, labs and discussed the procedure including the risks, benefits and alternatives for the proposed anesthesia with the patient or authorized representative who has indicated his/her understanding and acceptance.   Dental advisory given  Plan Discussed with: CRNA  Anesthesia Plan Comments:         Anesthesia Quick Evaluation

## 2016-03-04 NOTE — Anesthesia Procedure Notes (Addendum)
Spinal  Patient location during procedure: OR End time: 03/04/2016 4:48 PM Staffing Anesthesiologist: [name] Performed: anesthesiologist  Preanesthetic Checklist Completed: patient identified, site marked, surgical consent, pre-op evaluation, timeout performed, IV checked, risks and benefits discussed and monitors and equipment checked Spinal Block Patient position: sitting Prep: ChloraPrep Patient monitoring: heart rate, continuous pulse ox and blood pressure Approach: right paramedian Location: L3-4 Injection technique: single-shot Needle Needle type: Pencan  Needle gauge: 24 G Needle length: 9 cm Additional Notes Expiration date of kit checked and confirmed. Patient tolerated procedure well, without complications.

## 2016-03-04 NOTE — Anesthesia Procedure Notes (Addendum)
Anesthesia Regional Block:  Adductor canal block  Pre-Anesthetic Checklist: ,, timeout performed, Correct Patient, Correct Site, Correct Laterality, Correct Procedure, Correct Position, site marked, Risks and benefits discussed,  Surgical consent,  Pre-op evaluation,  At surgeon's request and post-op pain management  Laterality: Right  Prep: chloraprep       Needles:  Injection technique: Single-shot  Needle Type: Stimiplex     Needle Length: 9cm 9 cm Needle Gauge: 21 and 21 G    Additional Needles:  Procedures: ultrasound guided (picture in chart) Adductor canal block Narrative:  Start time: 03/04/2016 4:14 PM End time: 03/04/2016 4:21 PM Injection made incrementally with aspirations every 5 mL.  Performed by: Personally  Anesthesiologist: [name]  Additional Notes: BP cuff, EKG monitors applied. Sedation begun. Artery and nerve location verified with U/S and anesthetic injected incrementally, slowly, and after negative aspirations under direct u/s guidance. Good fascial /perineural spread. Tolerated well.

## 2016-03-05 LAB — BASIC METABOLIC PANEL
ANION GAP: 6 (ref 5–15)
BUN: 18 mg/dL (ref 6–20)
CHLORIDE: 104 mmol/L (ref 101–111)
CO2: 25 mmol/L (ref 22–32)
Calcium: 9 mg/dL (ref 8.9–10.3)
Creatinine, Ser: 0.85 mg/dL (ref 0.61–1.24)
GFR calc Af Amer: >60 mL/min (ref >60)
GLUCOSE: 148 mg/dL — AB (ref 65–99)
POTASSIUM: 4.6 mmol/L (ref 3.5–5.1)
Sodium: 135 mmol/L (ref 135–145)

## 2016-03-05 LAB — CBC
HEMATOCRIT: 36.3 % — AB (ref 39.0–52.0)
HEMOGLOBIN: 12.7 g/dL — AB (ref 13.0–17.0)
MCH: 34.9 pg — AB (ref 26.0–34.0)
MCHC: 35 g/dL (ref 30.0–36.0)
MCV: 99.7 fL (ref 78.0–100.0)
Platelets: 174 10*3/uL (ref 150–400)
RBC: 3.64 MIL/uL — AB (ref 4.22–5.81)
RDW: 13.1 % (ref 11.5–15.5)
WBC: 8.4 10*3/uL (ref 4.0–10.5)

## 2016-03-05 MED ORDER — PSYLLIUM 95 % PO PACK
1.0000 | PACK | Freq: Every day | ORAL | Status: DC
  Filled 2016-03-05: qty 1

## 2016-03-05 MED ORDER — PSYLLIUM 95 % PO PACK
1.0000 | PACK | Freq: Once | ORAL | Status: AC
  Administered 2016-03-05: 1 via ORAL
  Filled 2016-03-05: qty 1

## 2016-03-05 NOTE — Anesthesia Postprocedure Evaluation (Signed)
Anesthesia Post Note  Patient: Zachary Sanchez  Procedure(s) Performed: Procedure(s) (LRB): RIGHT TOTAL KNEE ARTHROPLASTY (Right)  Patient location during evaluation: PACU Anesthesia Type: Spinal and Regional Level of consciousness: awake and alert Pain management: pain level controlled Vital Signs Assessment: post-procedure vital signs reviewed and stable Respiratory status: spontaneous breathing and respiratory function stable Cardiovascular status: blood pressure returned to baseline and stable Postop Assessment: spinal receding Anesthetic complications: no    Last Vitals:  Vitals:   03/05/16 1432 03/05/16 2148  BP: 109/71 103/73  Pulse: (!) 52 (!) 58  Resp: 20 18  Temp: 36.6 C 36.8 C    Last Pain:  Vitals:   03/05/16 2148  TempSrc: Oral  PainSc:                  [name]

## 2016-03-05 NOTE — Progress Notes (Signed)
   03/05/16 1500  PT Visit Information  Last PT Received On 03/05/16  Assistance Needed +1  History of Present Illness Pt s/p right TKA. PMH includes heart murmur, atrial flutter, aortic root enlargement, dysrhythmia, biatrial enlargement, gout, and hyperlipidemia.  Subjective Data  Patient Stated Goal not stated  Precautions  Precautions Fall;Knee  Precaution Booklet Issued No  Precaution Comments KI not used, IND SLRs  Restrictions  Weight Bearing Restrictions No  Other Position/Activity Restrictions WBAT  Pain Assessment  Pain Assessment 0-10  Pain Score 3  Pain Location R knee  Pain Descriptors / Indicators Sore  Pain Intervention(s) Limited activity within patient's tolerance;Monitored during session  Cognition  Arousal/Alertness Awake/alert  Behavior During Therapy WFL for tasks assessed/performed  Overall Cognitive Status Within Functional Limits for tasks assessed  Bed Mobility  Overal bed mobility Needs Assistance  Bed Mobility Sit to Supine;Supine to Sit  Supine to sit Min guard  Sit to supine Supervision  General bed mobility comments cues for technique  Transfers  Overall transfer level Needs assistance  Equipment used Rolling walker (2 wheeled)  Transfers Sit to/from Stand  Sit to Stand Min guard;Supervision  General transfer comment cues for hand placement, to control descent and RLE position  Ambulation/Gait  Ambulation/Gait assistance Min guard;Supervision  Ambulation Distance (Feet) 80 Feet (10)  Assistive device Rolling walker (2 wheeled)  Gait Pattern/deviations Step-through pattern;Step-to pattern;Decreased stride length;Decreased weight shift to right  General Gait Details cues for sequence and RW safety  Total Joint Exercises  Ankle Circles/Pumps AROM;Both;10 reps  Quad Sets AROM;Both;10 reps  Heel Slides AAROM;AROM;Right;10 reps  Hip ABduction/ADduction AROM;Right;10 reps  Straight Leg Raises AROM;AAROM;Right;10 reps  Goniometric ROM grossly  10-85* right knee flexion  PT - End of Session  Equipment Utilized During Treatment Gait belt  Activity Tolerance Patient tolerated treatment well  Patient left in bed;with call bell/phone within reach;with bed alarm set;with family/visitor present  PT - Assessment/Plan  PT Plan Current plan remains appropriate  PT Frequency (ACUTE ONLY) 7X/week  Follow Up Recommendations Home health PT  PT equipment Rolling walker with 5" wheels;3in1 (PT)  PT Goal Progression  Progress towards PT goals Progressing toward goals  Acute Rehab PT Goals  PT Goal Formulation With patient  Time For Goal Achievement 03/12/16  Potential to Achieve Goals Good  PT Time Calculation  PT Start Time (ACUTE ONLY) 1345  PT Stop Time (ACUTE ONLY) 1411  PT Time Calculation (min) (ACUTE ONLY) 26 min  PT General Charges  $$ ACUTE PT VISIT 1 Procedure  PT Treatments  $Gait Training 8-22 mins  $Therapeutic Exercise 8-22 mins

## 2016-03-05 NOTE — Evaluation (Addendum)
Occupational Therapy Evaluation Patient Details Name: Zachary Sanchez MRN: [id number] DOB: 1946/03/20 Today's Date: 03/05/2016    History of Present Illness Pt s/p right TKA. PMH includes heart murmur, atrial flutter, aortic root enlargement, dysrhythmia, biatrial enlargement, gout, and hyperlipidemia.   Clinical Impression   Pt s/p above. Pt independent with ADLs, PTA. Feel pt will benefit from acute OT to increase independence and safety prior to d/c.     Follow Up Recommendations  No OT follow up;Supervision - Intermittent    Equipment Recommendations   (reports a 3 in 1 is being ordered)    Recommendations for Other Services       Precautions / Restrictions Precautions Precautions: Fall;Knee Precaution Booklet Issued: No Required Braces or Orthoses:  (Rt knee immobilizer used in session) Restrictions Weight Bearing Restrictions: Yes RLE Weight Bearing: Weight bearing as tolerated Other Position/Activity Restrictions: WBAT      Mobility Bed Mobility   General bed mobility comments: not assessed  Transfers Overall transfer level: Needs assistance Equipment used: Rolling walker (2 wheeled) Transfers: Sit to/from Stand Sit to Stand: Min guard           Balance    Used RW for ambulation. Performed functional task while standing without LOB.                                        ADL Overall ADL's : Needs assistance/impaired     Grooming: Set up;Supervision/safety;Standing (washed hand); washed hand               Lower Body Dressing: Supervision/safety;Set up;Sit to/from stand   Toilet Transfer: Min guard;Ambulation;Regular Toilet;RW Toilet Transfer Details (indicate cue type and reason): stood and urinated at toilet Toileting- Clothing Manipulation and Hygiene: Supervision/safety (standing)       Functional mobility during ADLs: Min guard;Rolling walker General ADL Comments: Educated on safety tips. Pt able to don/doff right  sock.     Vision     Perception     Praxis      Pertinent Vitals/Pain Pain Assessment: 0-10 Pain Score: 2  (2 at beginning of session but increased with movement) Pain Location: Rt knee Pain Descriptors / Indicators: Sore;Other (Comment) (stiff) Pain Intervention(s): Monitored during session;Repositioned     Hand Dominance     Extremity/Trunk Assessment Upper Extremity Assessment Upper Extremity Assessment: Overall WFL for tasks assessed   Lower Extremity Assessment Lower Extremity Assessment: Defer to PT evaluation       Communication Communication Communication: No difficulties   Cognition Arousal/Alertness: Awake/alert Behavior During Therapy: WFL for tasks assessed/performed Overall Cognitive Status: Within Functional Limits for tasks assessed                     General Comments       Exercises Exercises: Other exercises Other Exercises Other Exercises: performed 4 right straight leg raises.   Shoulder Instructions      Home Living Family/patient expects to be discharged to:: Private residence Living Arrangements: Spouse/significant other Available Help at Discharge: Family;Available 24 hours/day Type of Home: House Home Access: Stairs to enter [employer] of Steps: 4 and 1  (front) Entrance Stairs-Rails: Right;Left;Can reach both Home Layout: Multi-level Alternate Level Stairs-Number of Steps: 11 Alternate Level Stairs-Rails: Right Bathroom Shower/Tub: Occupational psychologist:  (elevated toilet)     Home Equipment: None (reports they are ordering a 3 in 1  for him)   Additional Comments: pt plans to go upstairs and stay for 1-2 days      Prior Functioning/Environment Level of Independence: Independent                 OT Problem List: Decreased strength;Decreased range of motion;Pain;Decreased activity tolerance;Decreased knowledge of use of DME or AE;Decreased knowledge of precautions   OT  Treatment/Interventions: Self-care/ADL training;DME and/or AE instruction;Therapeutic activities;Patient/family education;Balance training    OT Goals(Current goals can be found in the care plan section) Acute Rehab OT Goals Patient Stated Goal: not stated OT Goal Formulation: With patient Time For Goal Achievement: 03/12/16 Potential to Achieve Goals: Good ADL Goals Pt Will Transfer to Toilet: ambulating;with modified independence (elevated toilet) Pt Will Perform Tub/Shower Transfer: Shower transfer;with set-up;with supervision;ambulating;rolling walker;3 in 1  OT Frequency: Min 2X/week   Barriers to D/C:            Co-evaluation              End of Session Equipment Utilized During Treatment: Gait belt;Rolling walker;Right knee immobilizer  Activity Tolerance: Patient tolerated treatment well Patient left: in chair;with call bell/phone within reach   Time: 1052-1109 OT Time Calculation (min): 17 min Charges:  OT General Charges $OT Visit: 1 Procedure OT Evaluation $OT Eval Moderate Complexity: 1 Procedure G-Codes:    Zachary Sanchez L OTR/L 03/05/2016, 12:34 PM

## 2016-03-05 NOTE — Progress Notes (Signed)
Zachary Sanchez  MRN: [id number] DOB/Age: [birth year] 70 y.o. Physician: Theda Sers Procedure: Procedure(s) (LRB): RIGHT TOTAL KNEE ARTHROPLASTY (Right)     Subjective: Overall doing well, pain controlled no specific complaints  Vital Signs Temp:  [97.2 F (36.2 C)-98.5 F (36.9 C)] 98.5 F (36.9 C) (11/18 0919) Pulse Rate:  [42-76] 57 (11/18 0919) Resp:  [9-19] 18 (11/18 0919) BP: (94-137)/(61-100) 100/61 (11/18 0919) SpO2:  [93 %-100 %] 96 % (11/18 0919) Weight:  [96.2 kg (212 lb)] 96.2 kg (212 lb) (11/17 1424)  Lab Results  Recent Labs  03/05/16 0353  WBC 8.4  HGB 12.7*  HCT 36.3*  PLT 174   BMET  Recent Labs  03/05/16 0353  NA 135  K 4.6  CL 104  CO2 25  GLUCOSE 148*  BUN 18  CREATININE 0.85  CALCIUM 9.0   INR  Date Value Ref Range Status  02/23/2016 1.03  Final     Exam Right knee dressing intact, NVI hemovac removed without difficulty        Plan Mobilize with PT DC foley TKA protocol  Brian Kocourek PA-C   03/05/2016, 9:20 AM Contact # [phone]

## 2016-03-05 NOTE — Care Management Note (Signed)
Case Management Note  Patient Details  Name: Zachary Sanchez MRN: [id number] Date of Birth: [birth year]  Subjective/Objective:  Right TKA                  Action/Plan: Discharge Planning: NCM spoke to pt. Offered choice for HH/provided list. Pt agreeable to [facility]. Pt requesting RW and 3n1 bedside commode for home. Contacted [facility] for equipment to be delivered to room.   Expected Discharge Date:  03/06/2016              Expected Discharge Plan:  [place]  In-House Referral:  NA  Discharge planning Services  CM Consult  Post Acute Care Choice:  Home Health Choice offered to:  Patient  DME Arranged:  3-N-1, Walker rolling DME Agency:  [place]:  PT [place] Agency:  [facility]  Status of Service:  Completed, signed off  If discussed at [employer], dates discussed:    Additional Comments:  Erenest Rasher, RN 03/05/2016, 10:15 AM

## 2016-03-05 NOTE — Evaluation (Signed)
Physical Therapy Evaluation Patient Details Name: Zachary Sanchez MRN: [id number] DOB: 07/12/1945 Today's Date: 03/05/2016   History of Present Illness  s/p  R TKA  Clinical Impression  Pt is s/p TKA resulting in the deficits listed below (see PT Problem List). *Pt will benefit from skilled PT to increase their independence and safety with mobility to allow discharge to the venue listed below.  amb 21' today with min/min-guard assist; should progress well    Follow Up Recommendations Home health PT    Equipment Recommendations  Rolling walker with 5" wheels;3in1 (PT)    Recommendations for Other Services       Precautions / Restrictions Precautions Precautions: Fall;Knee Restrictions Weight Bearing Restrictions: No Other Position/Activity Restrictions: WBAT      Mobility  Bed Mobility Overal bed mobility: Needs Assistance Bed Mobility: Supine to Sit     Supine to sit: Min guard     General bed mobility comments: cues for technique  Transfers Overall transfer level: Needs assistance Equipment used: Rolling walker (2 wheeled) Transfers: Sit to/from Stand Sit to Stand: Min assist         General transfer comment: assist to rise and stabilize, cues for hand placement and RLE position  Ambulation/Gait Ambulation/Gait assistance: Min guard Ambulation Distance (Feet): 80 Feet Assistive device: Rolling walker (2 wheeled) Gait Pattern/deviations: Step-to pattern;Antalgic     General Gait Details: cues for sequence and RW safety  Stairs            Wheelchair Mobility    Modified Rankin (Stroke Patients Only)       Balance                                             Pertinent Vitals/Pain Pain Assessment: No/denies pain Pain Score: 0-No pain Pain Location: no pain at rest Pain Intervention(s): Monitored during session    Home Living Family/patient expects to be discharged to:: Private residence Living Arrangements:  Spouse/significant other Available Help at Discharge: Family;Available 24 hours/day Type of Home: House Home Access: Stairs to enter Entrance Stairs-Rails: Right;Left;Can reach both Entrance Stairs-Number of Steps: 4 and 1  (front) Home Layout: Multi-level Home Equipment: None Additional Comments: pt plans to go upstairs and stay for 1-2 days    Prior Function Level of Independence: Independent               Hand Dominance        Extremity/Trunk Assessment   Upper Extremity Assessment: Defer to OT evaluation           Lower Extremity Assessment: RLE deficits/detail RLE Deficits / Details: AAROM knee flexion ~ 10 to 55*; knee extension and hip flexion grossly 3/5, anticipated post op weakness       Communication   Communication: No difficulties  Cognition Arousal/Alertness: Awake/alert Behavior During Therapy: WFL for tasks assessed/performed Overall Cognitive Status: Within Functional Limits for tasks assessed                      General Comments      Exercises Total Joint Exercises Ankle Circles/Pumps: AROM;Both;10 reps Quad Sets: AROM;Both;10 reps   Assessment/Plan    PT Assessment Patient needs continued PT services  PT Problem List Decreased strength;Decreased range of motion;Decreased mobility;Decreased knowledge of use of DME;Decreased safety awareness          PT Treatment Interventions DME  instruction;Gait training;Therapeutic activities;Therapeutic exercise;Functional mobility training;Patient/family education;Stair training    PT Goals (Current goals can be found in the Care Plan section)  Acute Rehab PT Goals Patient Stated Goal: return to active lifestyle PT Goal Formulation: With patient Time For Goal Achievement: 03/12/16 Potential to Achieve Goals: Good    Frequency 7X/week   Barriers to discharge        Co-evaluation               End of Session Equipment Utilized During Treatment: Gait belt;Right knee  immobilizer Activity Tolerance: Patient tolerated treatment well Patient left: in chair;with call bell/phone within reach;with chair alarm set;with family/visitor present           Time: 1000-1030 PT Time Calculation (min) (ACUTE ONLY): 30 min   Charges:   PT Evaluation $PT Eval Low Complexity: 1 Procedure PT Treatments $Gait Training: 8-22 mins   PT G Codes:        Marykatherine Sherwood 2016/03/09, 11:05 AM

## 2016-03-06 LAB — CBC
HEMATOCRIT: 37.8 % — AB (ref 39.0–52.0)
HEMOGLOBIN: 13 g/dL (ref 13.0–17.0)
MCH: 34.7 pg — AB (ref 26.0–34.0)
MCHC: 34.4 g/dL (ref 30.0–36.0)
MCV: 100.8 fL — ABNORMAL HIGH (ref 78.0–100.0)
Platelets: 177 10*3/uL (ref 150–400)
RBC: 3.75 MIL/uL — ABNORMAL LOW (ref 4.22–5.81)
RDW: 13.3 % (ref 11.5–15.5)
WBC: 11.5 10*3/uL — ABNORMAL HIGH (ref 4.0–10.5)

## 2016-03-06 MED ORDER — METHOCARBAMOL 500 MG PO TABS: 500.0000 mg | ORAL_TABLET | Freq: Four times a day (QID) | ORAL | 2 refills | Status: DC | PRN

## 2016-03-06 MED ORDER — HYDROMORPHONE HCL 2 MG PO TABS: 2.0000 mg | ORAL_TABLET | ORAL | 0 refills | Status: DC | PRN

## 2016-03-06 MED ORDER — ASPIRIN EC 325 MG PO TBEC: 325.0000 mg | DELAYED_RELEASE_TABLET | Freq: Two times a day (BID) | ORAL | 0 refills | Status: DC

## 2016-03-06 NOTE — Progress Notes (Signed)
Assessment unchanged. Pt and wife verbalized understanding of dc instructions through teach back including follow up care and when to call the doctor. Scripts given as provided by MD. Discharged via wc accompanied by NT and wife.

## 2016-03-06 NOTE — Discharge Summary (Signed)
Physician Discharge Summary  Patient ID: Zachary Sanchez MRN: [id number] DOB/AGE: Aug 22, 1945 70 y.o.  Admit date: 03/04/2016 Discharge date: 03/06/2016  Admission Diagnoses:  Discharge Diagnoses:  Active Problems:   S/P knee replacement   Discharged Condition: good  Hospital Course:  DAYTRON GULAN is a 70 y.o. who was admitted to [facility]. They were brought to the operating room on 03/04/2016 and underwent Procedure(s): RIGHT TOTAL KNEE ARTHROPLASTY.  Patient tolerated the procedure well and was later transferred to the recovery room and then to the orthopaedic floor for postoperative care.  They were given PO and IV analgesics for pain control following their surgery.  They were given 24 hours of postoperative antibiotics of  Anti-infectives    Start     Dose/Rate Route Frequency Ordered Stop   03/05/16 0600  ceFAZolin (ANCEF) IVPB 2g/100 mL premix     2 g 200 mL/hr over 30 Minutes Intravenous On call to O.R. 03/04/16 1412 03/04/16 1643   03/05/16 0000  ceFAZolin (ANCEF) IVPB 2g/100 mL premix     2 g 200 mL/hr over 30 Minutes Intravenous Every 6 hours 03/04/16 2048 03/05/16 0703     and started on DVT prophylaxis in the form of lovenox.   PT and OT were ordered for total joint protocol.  Discharge planning consulted to help with postop disposition and equipment needs.  Patient had a good night on the evening of surgery and started to get up OOB with therapy on day one.  Hemovac drain was pulled without difficulty.  Continued to work with therapy into day two.  Dressing was with normal limits.  The patient had progressed with therapy and meeting their goals. Patient was seen in rounds and was ready to go home.  Consults: n/a  Significant Diagnostic Studies:routine  Treatments: routine  Discharge Exam: Blood pressure 117/78, pulse 60, temperature 97.6 F (36.4 C), temperature source Axillary, resp. rate 18, height 6\' 2"  (1.88 m), weight 96.2 kg (212 lb), SpO2 99 %. Well  nourished. Alert and oriented x3. RRR, Lungs clear, BS x4. Abdomen soft and non tender. Right Calf soft and non tender. Right knee dressing C/D/I. No DVT signs. Compartment soft. No signs of infection.  Right LE grossly neurovascular intact.  Disposition: 01-Home or Self Care  Discharge Instructions    Call MD / Call 911    Complete by:  As directed    If you experience chest pain or shortness of breath, CALL 911 and be transported to the hospital emergency room.  If you develope a fever above 101 F, pus (white drainage) or increased drainage or redness at the wound, or calf pain, call your surgeon's office.   Constipation Prevention    Complete by:  As directed    Drink plenty of fluids.  Prune juice may be helpful.  You may use a stool softener, such as Colace (over the counter) 100 mg twice a day.  Use MiraLax (over the counter) for constipation as needed.   Diet - low sodium heart healthy    Complete by:  As directed    Discharge instructions    Complete by:  As directed    INSTRUCTIONS AFTER JOINT REPLACEMENT   Remove items at home which could result in a fall. This includes throw rugs or furniture in walking pathways ICE to the affected joint every three hours while awake for 30 minutes at a time, for at least the first 3-5 days, and then as needed for pain and swelling.  Continue to use ice for pain and swelling. You may notice swelling that will progress down to the foot and ankle.  This is normal after surgery.  Elevate your leg when you are not up walking on it.   Continue to use the breathing machine you got in the hospital (incentive spirometer) which will help keep your temperature down.  It is common for your temperature to cycle up and down following surgery, especially at night when you are not up moving around and exerting yourself.  The breathing machine keeps your lungs expanded and your temperature down.   DIET:  As you were doing prior to hospitalization, we recommend a  well-balanced diet.  DRESSING / WOUND CARE / SHOWERING  Keep the surgical dressing until follow up.  The dressing is water proof, so you can shower without any extra covering.  IF THE DRESSING FALLS OFF or the wound gets wet inside, change the dressing with sterile gauze.  Please use good hand washing techniques before changing the dressing.  Do not use any lotions or creams on the incision until instructed by your surgeon.    ACTIVITY  Increase activity slowly as tolerated, but follow the weight bearing instructions below.   No driving for 6 weeks or until further direction given by your physician.  You cannot drive while taking narcotics.  No lifting or carrying greater than 10 lbs. until further directed by your surgeon. Avoid periods of inactivity such as sitting longer than an hour when not asleep. This helps prevent blood clots.  You may return to work once you are authorized by your doctor.     WEIGHT BEARING   Weight bearing as tolerated with assist device (walker, cane, etc) as directed, use it as long as suggested by your surgeon or therapist, typically at least 4-6 weeks.   EXERCISES  Results after joint replacement surgery are often greatly improved when you follow the exercise, range of motion and muscle strengthening exercises prescribed by your doctor. Safety measures are also important to protect the joint from further injury. Any time any of these exercises cause you to have increased pain or swelling, decrease what you are doing until you are comfortable again and then slowly increase them. If you have problems or questions, call your caregiver or physical therapist for advice.   Rehabilitation is important following a joint replacement. After just a few days of immobilization, the muscles of the leg can become weakened and shrink (atrophy).  These exercises are designed to build up the tone and strength of the thigh and leg muscles and to improve motion. Often times heat  used for twenty to thirty minutes before working out will loosen up your tissues and help with improving the range of motion but do not use heat for the first two weeks following surgery (sometimes heat can increase post-operative swelling).   These exercises can be done on a training (exercise) mat, on the floor, on a table or on a bed. Use whatever works the best and is most comfortable for you.    Use music or television while you are exercising so that the exercises are a pleasant break in your day. This will make your life better with the exercises acting as a break in your routine that you can look forward to.   Perform all exercises about fifteen times, three times per day or as directed.  You should exercise both the operative leg and the other leg as well.   Exercises include:  Quad Sets - Tighten up the muscle on the front of the thigh (Quad) and hold for 5-10 seconds.   Straight Leg Raises - With your knee straight (if you were given a brace, keep it on), lift the leg to 60 degrees, hold for 3 seconds, and slowly lower the leg.  Perform this exercise against resistance later as your leg gets stronger.  Leg Slides: Lying on your back, slowly slide your foot toward your buttocks, bending your knee up off the floor (only go as far as is comfortable). Then slowly slide your foot back down until your leg is flat on the floor again.  Angel Wings: Lying on your back spread your legs to the side as far apart as you can without causing discomfort.  Hamstring Strength:  Lying on your back, push your heel against the floor with your leg straight by tightening up the muscles of your buttocks.  Repeat, but this time bend your knee to a comfortable angle, and push your heel against the floor.  You may put a pillow under the heel to make it more comfortable if necessary.   A rehabilitation program following joint replacement surgery can speed recovery and prevent re-injury in the future due to weakened  muscles. Contact your doctor or a physical therapist for more information on knee rehabilitation.    CONSTIPATION  Constipation is defined medically as fewer than three stools per week and severe constipation as less than one stool per week.  Even if you have a regular bowel pattern at home, your normal regimen is likely to be disrupted due to multiple reasons following surgery.  Combination of anesthesia, postoperative narcotics, change in appetite and fluid intake all can affect your bowels.   YOU MUST use at least one of the following options; they are listed in order of increasing strength to get the job done.  They are all available over the counter, and you may need to use some, POSSIBLY even all of these options:    Drink plenty of fluids (prune juice may be helpful) and high fiber foods Colace 100 mg by mouth twice a day  Senokot for constipation as directed and as needed Dulcolax (bisacodyl), take with full glass of water  Miralax (polyethylene glycol) once or twice a day as needed.  If you have tried all these things and are unable to have a bowel movement in the first 3-4 days after surgery call either your surgeon or your primary doctor.    If you experience loose stools or diarrhea, hold the medications until you stool forms back up.  If your symptoms do not get better within 1 week or if they get worse, check with your doctor.  If you experience "the worst abdominal pain ever" or develop nausea or vomiting, please contact the office immediately for further recommendations for treatment.   ITCHING:  If you experience itching with your medications, try taking only a single pain pill, or even half a pain pill at a time.  You can also use Benadryl over the counter for itching or also to help with sleep.   TED HOSE STOCKINGS:  Use stockings on both legs until for at least 2 weeks or as directed by physician office. They may be removed at night for sleeping.  MEDICATIONS:  See your  medication summary on the "After Visit Summary" that nursing will review with you.  You may have some home medications which will be placed on hold until you complete the course  of blood thinner medication.  It is important for you to complete the blood thinner medication as prescribed.  PRECAUTIONS:  If you experience chest pain or shortness of breath - call 911 immediately for transfer to the hospital emergency department.   If you develop a fever greater that 101 F, purulent drainage from wound, increased redness or drainage from wound, foul odor from the wound/dressing, or calf pain - CONTACT YOUR SURGEON.                                                   FOLLOW-UP APPOINTMENTS:  If you do not already have a post-op appointment, please call the office for an appointment to be seen by your surgeon.  Guidelines for how soon to be seen are listed in your "After Visit Summary", but are typically between 1-4 weeks after surgery.  OTHER INSTRUCTIONS:   Knee Replacement:  Do not place pillow under knee, focus on keeping the knee straight while resting. CPM instructions: 0-90 degrees, 2 hours in the morning, 2 hours in the afternoon, and 2 hours in the evening. Place foam block, curve side up under heel at all times except when in CPM or when walking.  DO NOT modify, tear, cut, or change the foam block in any way.  MAKE SURE YOU:  Understand these instructions.  Get help right away if you are not doing well or get worse.    Thank you for letting us be a part of your medical care team.  It is a privilege we respect greatly.  We hope these instructions will help you stay on track for a fast and full recovery!   Increase activity slowly as tolerated    Complete by:  As directed        Medication List    STOP taking these medications   aspirin 81 MG tablet Replaced by:  aspirin EC 325 MG tablet     TAKE these medications   allopurinol 300 MG tablet Commonly known as:  ZYLOPRIM Take 300 mg by  mouth daily.   aspirin EC 325 MG tablet Take 1 tablet (325 mg total) by mouth 2 (two) times daily. Replaces:  aspirin 81 MG tablet   bismuth-metronidazole-tetracycline 140-125-125 MG capsule Commonly known as:  PYLERA Take 3 capsules by mouth 4 (four) times daily -  before meals and at bedtime.   cholecalciferol 1000 units tablet Commonly known as:  VITAMIN D Take 1,000 Units by mouth daily.   HYDROmorphone 2 MG tablet Commonly known as:  DILAUDID Take 1-2 tablets (2-4 mg total) by mouth every 4 (four) hours as needed for severe pain.   methocarbamol 500 MG tablet Commonly known as:  ROBAXIN Take 1 tablet (500 mg total) by mouth every 6 (six) hours as needed for muscle spasms.   omeprazole 40 MG capsule Commonly known as:  PRILOSEC Take 40 mg by mouth daily.   simvastatin 40 MG tablet Commonly known as:  ZOCOR Take 40 mg by mouth at bedtime.            Durable Medical Equipment        Start     Ordered   03/05/16 1025  For home use only DME 3 n 1  Once     03/05/16 1024   03/05/16 1024  For home use only DME Walker rolling  Once    Question:  Patient needs a walker to treat with the following condition  Answer:  S/P total knee arthroplasty   03/05/16 1024     Follow-up Information    [facility] Follow up.   Specialty:  [place] Why:  Home Health Physical Therapy Contact information: [address] [place] [address] [phone]           Signed: Lajean Manes 03/06/2016, 8:10 AM

## 2016-03-06 NOTE — Progress Notes (Signed)
Occupational Therapy Treatment Patient Details Name: Zachary Sanchez MRN: [id number] DOB: 03/30/1946 Today's Date: 03/06/2016    History of present illness Pt s/p right TKA. PMH includes heart murmur, atrial flutter, aortic root enlargement, dysrhythmia, biatrial enlargement, gout, and hyperlipidemia.      Follow Up Recommendations  No OT follow up    Equipment Recommendations  None recommended by OT    Recommendations for Other Services      Precautions / Restrictions Precautions Precautions: Fall;Knee Precaution Booklet Issued: No Precaution Comments: KI not used, IND SLRs Restrictions Weight Bearing Restrictions: No RLE Weight Bearing: Weight bearing as tolerated       Mobility Bed Mobility Overal bed mobility: Needs Assistance Bed Mobility: Supine to Sit     Supine to sit: Supervision Sit to supine: Min guard;Min assist   General bed mobility comments: cues for technique  Transfers Overall transfer level: Needs assistance Equipment used: Rolling walker (2 wheeled) Transfers: Sit to/from [employer] Sit to Stand: Supervision Stand pivot transfers: Supervision       General transfer comment: cues for hand placement        ADL Overall ADL's : Needs assistance/impaired             Lower Body Bathing: Minimal assistance;Cueing for sequencing;Cueing for compensatory techniques;Sit to/from stand       Lower Body Dressing: Minimal assistance;Cueing for sequencing;Cueing for compensatory techniques   Toilet Transfer: Ambulation;Regular Toilet;RW;Supervision/safety   Toileting- Clothing Manipulation and Hygiene: Supervision/safety     Tub/Shower Transfer Details (indicate cue type and reason): verbalized safety                    Cognition   Behavior During Therapy: WFL for tasks assessed/performed Overall Cognitive Status: Within Functional Limits for tasks assessed                         Exercises Total Joint  Exercises Ankle Circles/Pumps:  (deferred d/t pain; encouraged to perform later when pain dec)   Shoulder Instructions            Pertinent Vitals/ Pain       Pain Assessment: Faces Pain Score: 4  Faces Pain Scale: Hurts whole lot Pain Location: r knee Pain Descriptors / Indicators: Sore Pain Intervention(s): Monitored during session;Ice applied         Frequency           Progress Toward Goals  OT Goals(current goals can now be found in the care plan section)  Progress towards OT goals: Progressing toward goals  Acute Rehab OT Goals Patient Stated Goal: not stated  Plan Discharge plan remains appropriate       End of Session Equipment Utilized During Treatment: Rolling walker   Activity Tolerance Patient tolerated treatment well   Patient Left in chair;with call bell/phone within reach;with family/visitor present   Nurse Communication Mobility status        Time: 1037-1050 OT Time Calculation (min): 13 min  Charges: OT General Charges $OT Visit: 1 Procedure OT Treatments $Self Care/Home Management : 8-22 mins  Nyeisha Goodall D 03/06/2016, 11:44 AM

## 2016-03-06 NOTE — Progress Notes (Signed)
Physical Therapy Treatment Patient Details Name: Zachary Sanchez MRN: [id number] DOB: [birth year] Today's Date: 03/06/2016    History of Present Illness Pt s/p right TKA. PMH includes heart murmur, atrial flutter, aortic root enlargement, dysrhythmia, biatrial enlargement, gout, and hyperlipidemia.    PT Comments    Pt doing well with mobility, pain incr despite having had meds; HEP limited by pain--encouraged to do later today  Follow Up Recommendations  Home health PT     Equipment Recommendations  Rolling walker with 5" wheels;3in1 (PT)    Recommendations for Other Services       Precautions / Restrictions Precautions Precautions: Fall;Knee Precaution Booklet Issued: No Precaution Comments: KI not used, IND SLRs Restrictions Weight Bearing Restrictions: No RLE Weight Bearing: Weight bearing as tolerated    Mobility  Bed Mobility Overal bed mobility: Needs Assistance Bed Mobility: Sit to Supine;Supine to Sit     Supine to sit: Supervision Sit to supine: Min guard;Min assist   General bed mobility comments: cues for technique  Transfers Overall transfer level: Needs assistance Equipment used: Rolling walker (2 wheeled) Transfers: Sit to/from Stand Sit to Stand: Supervision         General transfer comment: cues for hand placement, to control descent and RLE position  Ambulation/Gait Ambulation/Gait assistance: Supervision Ambulation Distance (Feet): 60 Feet Assistive device: Rolling walker (2 wheeled) Gait Pattern/deviations: Step-to pattern     General Gait Details: cues for sequence and RW safety, limited by pain   Stairs Stairs: Yes Stairs assistance: Min guard Stair Management: No rails;Backwards;With walker;Step to pattern;Forwards;One rail Right;With crutches Number of Stairs: 10 (2) General stair comments: 2 steps backwards with RW; 6 steps with rail and crutch, forward  Wheelchair Mobility    Modified Rankin (Stroke Patients Only)        Balance                                    Cognition Arousal/Alertness: Awake/alert Behavior During Therapy: WFL for tasks assessed/performed Overall Cognitive Status: Within Functional Limits for tasks assessed                      Exercises Total Joint Exercises Ankle Circles/Pumps:  (deferred d/t pain; encouraged to perform later when pain dec)    General Comments        Pertinent Vitals/Pain Pain Assessment: Faces Faces Pain Scale: Hurts whole lot Pain Location: R knee Pain Descriptors / Indicators: Constant Pain Intervention(s): Limited activity within patient's tolerance;Monitored during session;Premedicated before session;Ice applied    Home Living                      Prior Function            PT Goals (current goals can now be found in the care plan section) Acute Rehab PT Goals Patient Stated Goal: not stated PT Goal Formulation: With patient Time For Goal Achievement: 03/12/16 Potential to Achieve Goals: Good Progress towards PT goals: Progressing toward goals    Frequency    7X/week      PT Plan Current plan remains appropriate    Co-evaluation             End of Session Equipment Utilized During Treatment: Gait belt Activity Tolerance: Patient tolerated treatment well;Patient limited by pain Patient left: in bed;with call bell/phone within reach;with family/visitor present     Time: [id number] PT Time  Calculation (min) (ACUTE ONLY): 33 min  Charges:  $Gait Training: 23-37 mins                    G Codes:      Zachary Sanchez Mar 13, 2016, 11:42 AM

## 2016-03-06 NOTE — Progress Notes (Signed)
Subjective: 2 Days Post-Op Procedure(s) (LRB): RIGHT TOTAL KNEE ARTHROPLASTY (Right) Patient reports pain as well controlled.  Tolerating PO;s. Progressing with PT. Denies CP, SOB, or calf pain. Reports he is ready for d/c.  Objective: Vital signs in last 24 hours: Temp:  [97.6 F (36.4 C)-98.5 F (36.9 C)] 97.6 F (36.4 C) (11/19 0444) Pulse Rate:  [50-60] 60 (11/19 0535) Resp:  [18-20] 18 (11/19 0444) BP: (100-117)/(61-78) 117/78 (11/19 0444) SpO2:  [95 %-99 %] 99 % (11/19 0444)  Intake/Output from previous day: 11/18 0701 - 11/19 0700 In: 920 [P.O.:920] Out: 445 [Urine:445] Intake/Output this shift: No intake/output data recorded.   Recent Labs  03/05/16 0353 03/06/16 0445  HGB 12.7* 13.0    Recent Labs  03/05/16 0353 03/06/16 0445  WBC 8.4 11.5*  RBC 3.64* 3.75*  HCT 36.3* 37.8*  PLT 174 177    Recent Labs  03/05/16 0353  NA 135  K 4.6  CL 104  CO2 25  BUN 18  CREATININE 0.85  GLUCOSE 148*  CALCIUM 9.0   No results for input(s): LABPT, INR in the last 72 hours.  Well nourished. Alert and oriented x3. RRR, Lungs clear, BS x4. Abdomen soft and non tender. Right Calf soft and non tender. Right knee dressing C/D/I. No DVT signs. Compartment soft. No signs of infection.  Right LE grossly neurovascular intact.  Assessment/Plan: 2 Days Post-Op Procedure(s) (LRB): RIGHT TOTAL KNEE ARTHROPLASTY (Right) D/c home Follow instructions Wound and dressing care instructions done HHPT F/u in  Office in 2 weeks or PRN   Valbona Slabach L 03/06/2016, 8:05 AM

## 2016-03-07 ENCOUNTER — Encounter (HOSPITAL_COMMUNITY): Payer: Self-pay | Admitting: Specialist

## 2016-03-07 NOTE — Addendum Note (Signed)
Addendum  created 03/07/16 1047 by Lollie Sails, CRNA   Charge Capture section accepted

## 2016-03-18 DIAGNOSIS — I2699 Other pulmonary embolism without acute cor pulmonale: Secondary | ICD-10-CM

## 2016-03-18 HISTORY — DX: Other pulmonary embolism without acute cor pulmonale: I26.99

## 2016-03-22 ENCOUNTER — Inpatient Hospital Stay (HOSPITAL_COMMUNITY)
Admission: EM | Admit: 2016-03-22 | Discharge: 2016-03-26 | DRG: 176 | Disposition: A | Discharge status: Home Health | Payer: Medicare Other | Attending: Internal Medicine | Admitting: Internal Medicine

## 2016-03-22 ENCOUNTER — Emergency Department (HOSPITAL_COMMUNITY): Payer: Medicare Other

## 2016-03-22 ENCOUNTER — Encounter (HOSPITAL_COMMUNITY): Payer: Self-pay

## 2016-03-22 DIAGNOSIS — I4821 Permanent atrial fibrillation: Secondary | ICD-10-CM | POA: Diagnosis present

## 2016-03-22 DIAGNOSIS — R0781 Pleurodynia: Secondary | ICD-10-CM | POA: Diagnosis present

## 2016-03-22 DIAGNOSIS — I2699 Other pulmonary embolism without acute cor pulmonale: Principal | ICD-10-CM | POA: Diagnosis present

## 2016-03-22 DIAGNOSIS — Z7982 Long term (current) use of aspirin: Secondary | ICD-10-CM

## 2016-03-22 DIAGNOSIS — R0602 Shortness of breath: Secondary | ICD-10-CM | POA: Diagnosis not present

## 2016-03-22 DIAGNOSIS — Z8546 Personal history of malignant neoplasm of prostate: Secondary | ICD-10-CM

## 2016-03-22 DIAGNOSIS — Z79899 Other long term (current) drug therapy: Secondary | ICD-10-CM

## 2016-03-22 DIAGNOSIS — E785 Hyperlipidemia, unspecified: Secondary | ICD-10-CM | POA: Diagnosis present

## 2016-03-22 DIAGNOSIS — C61 Malignant neoplasm of prostate: Secondary | ICD-10-CM | POA: Diagnosis present

## 2016-03-22 DIAGNOSIS — Z96659 Presence of unspecified artificial knee joint: Secondary | ICD-10-CM

## 2016-03-22 DIAGNOSIS — Z96651 Presence of right artificial knee joint: Secondary | ICD-10-CM | POA: Diagnosis present

## 2016-03-22 DIAGNOSIS — M109 Gout, unspecified: Secondary | ICD-10-CM | POA: Diagnosis present

## 2016-03-22 DIAGNOSIS — I482 Chronic atrial fibrillation: Secondary | ICD-10-CM | POA: Diagnosis present

## 2016-03-22 DIAGNOSIS — R739 Hyperglycemia, unspecified: Secondary | ICD-10-CM | POA: Diagnosis present

## 2016-03-22 DIAGNOSIS — Z86010 Personal history of colonic polyps: Secondary | ICD-10-CM

## 2016-03-22 DIAGNOSIS — E871 Hypo-osmolality and hyponatremia: Secondary | ICD-10-CM

## 2016-03-22 DIAGNOSIS — Z87891 Personal history of nicotine dependence: Secondary | ICD-10-CM

## 2016-03-22 DIAGNOSIS — I2609 Other pulmonary embolism with acute cor pulmonale: Secondary | ICD-10-CM

## 2016-03-22 DIAGNOSIS — I481 Persistent atrial fibrillation: Secondary | ICD-10-CM | POA: Diagnosis present

## 2016-03-22 LAB — I-STAT TROPONIN, ED: Troponin i, poc: 0 ng/mL (ref 0.00–0.08)

## 2016-03-22 LAB — CBC WITH DIFFERENTIAL/PLATELET
BASOS ABS: 0 10*3/uL (ref 0.0–0.1)
Basophils Relative: 0 %
Eosinophils Absolute: 0.1 10*3/uL (ref 0.0–0.7)
Eosinophils Relative: 1 %
HCT: 37.8 % — ABNORMAL LOW (ref 39.0–52.0)
HEMOGLOBIN: 13 g/dL (ref 13.0–17.0)
LYMPHS ABS: 2 10*3/uL (ref 0.7–4.0)
LYMPHS PCT: 20 %
MCH: 34.2 pg — AB (ref 26.0–34.0)
MCHC: 34.4 g/dL (ref 30.0–36.0)
MCV: 99.5 fL (ref 78.0–100.0)
Monocytes Absolute: 1.2 10*3/uL — ABNORMAL HIGH (ref 0.1–1.0)
Monocytes Relative: 12 %
NEUTROS PCT: 67 %
Neutro Abs: 6.7 10*3/uL (ref 1.7–7.7)
PLATELETS: 322 10*3/uL (ref 150–400)
RBC: 3.8 MIL/uL — AB (ref 4.22–5.81)
RDW: 13.2 % (ref 11.5–15.5)
WBC: 9.9 10*3/uL (ref 4.0–10.5)

## 2016-03-22 LAB — COMPREHENSIVE METABOLIC PANEL
ALK PHOS: 63 U/L (ref 38–126)
ALT: 29 U/L (ref 17–63)
AST: 37 U/L (ref 15–41)
Albumin: 3.5 g/dL (ref 3.5–5.0)
Anion gap: 12 (ref 5–15)
BUN: 8 mg/dL (ref 6–20)
CALCIUM: 9.7 mg/dL (ref 8.9–10.3)
CHLORIDE: 100 mmol/L — AB (ref 101–111)
CO2: 23 mmol/L (ref 22–32)
CREATININE: 0.92 mg/dL (ref 0.61–1.24)
Glucose, Bld: 155 mg/dL — ABNORMAL HIGH (ref 65–99)
Potassium: 3.8 mmol/L (ref 3.5–5.1)
SODIUM: 135 mmol/L (ref 135–145)
Total Bilirubin: 1.1 mg/dL (ref 0.3–1.2)
Total Protein: 7.2 g/dL (ref 6.5–8.1)

## 2016-03-22 MED ORDER — FENTANYL CITRATE (PF) 100 MCG/2ML IJ SOLN
25.0000 ug | Freq: Once | INTRAMUSCULAR | Status: AC
  Administered 2016-03-22: 25 ug via INTRAVENOUS
  Filled 2016-03-22: qty 2

## 2016-03-22 MED ORDER — IOPAMIDOL (ISOVUE-370) INJECTION 76%
INTRAVENOUS | Status: AC
  Administered 2016-03-22: 100 mL
  Filled 2016-03-22: qty 100

## 2016-03-22 NOTE — ED Provider Notes (Signed)
[facility] DEPT Provider Note   CSN: [id number] Arrival date & time: 03/22/16  2018     History   Chief Complaint No chief complaint on file.   HPI Zachary Sanchez is a 70 y.o. male.  HPI   Patient is a 70 year old male status post total knee recent surgery presenting today with shortness of breath. Patient reports that he had right pleuritic chest pain and shortness of breath starting this evening. Patient noted some swelling bilateral legs right worse than left although he found this is typical with surgery. Patient not have any chest pain. The pain associated with deep breaths.  Past Medical History:  Diagnosis Date  . Aortic root enlargement (HCC)    aortic root 50mm in size  . Atrial flutter ([place])   . Biatrial enlargement   . Colonic polyp   . DJD (degenerative joint disease)   . Dysrhythmia   . Gout   . History of benign prostatic hypertrophy   . Hyperlipidemia   . Persistent atrial fibrillation (HCC)    longstanding persistent, asymptomatic  . Prostate cancer ([place])   . Wears glasses     Patient Active Problem List   Diagnosis Date Noted  . S/P knee replacement 03/04/2016  . Prostate cancer ([place]) 07/09/2015  . Malignant neoplasm of prostate ([place]) 06/08/2015  . Aortic root enlargement ([place]) 02/25/2013  . ATRIAL FIBRILLATION 12/12/2008  . COLONIC POLYPS 09/25/2008  . HYPERLIPIDEMIA 09/25/2008  . GOUT 09/25/2008  . ATRIAL FLUTTER 09/25/2008  . BENIGN PROSTATIC HYPERTROPHY, HX OF 09/25/2008    Past Surgical History:  Procedure Laterality Date  . FRACTURE SURGERY     ankles and fingers  . KNEE SURGERY     right; menicus tear   . LYMPHADENECTOMY Bilateral 07/09/2015   Procedure: PELVIC LYMPHADENECTOMY;  Surgeon: Raynelle Bring, MD;  Location: [facility];  Service: Urology;  Laterality: Bilateral;  . ROBOT ASSISTED LAPAROSCOPIC RADICAL PROSTATECTOMY N/A 07/09/2015   Procedure: XI ROBOTIC ASSISTED LAPAROSCOPIC RADICAL PROSTATECTOMY LEVEL 2;  Surgeon: Raynelle Bring, MD;  Location: [facility];  Service: Urology;  Laterality: N/A;  . TONSILLECTOMY    . TOTAL KNEE ARTHROPLASTY Right 03/04/2016   Procedure: RIGHT TOTAL KNEE ARTHROPLASTY;  Surgeon: Sydnee Cabal, MD;  Location: [facility];  Service: Orthopedics;  Laterality: Right;       Home Medications    Prior to Admission medications   Medication Sig Start Date End Date Taking? Authorizing Provider  allopurinol (ZYLOPRIM) 300 MG tablet Take 300 mg by mouth daily.     Yes Historical Provider, MD  aspirin EC 325 MG tablet Take 1 tablet (325 mg total) by mouth 2 (two) times daily. 03/06/16  Yes Bryson L Stilwell, PA-C  cholecalciferol (VITAMIN D) 1000 units tablet Take 1,000 Units by mouth daily.   Yes Historical Provider, MD  HYDROmorphone (DILAUDID) 2 MG tablet Take 1-2 tablets (2-4 mg total) by mouth every 4 (four) hours as needed for severe pain. 03/06/16  Yes Bryson L Stilwell, PA-C  omeprazole (PRILOSEC) 40 MG capsule Take 40 mg by mouth daily.   Yes Historical Provider, MD  simvastatin (ZOCOR) 40 MG tablet Take 40 mg by mouth at bedtime.     Yes Historical Provider, MD  methocarbamol (ROBAXIN) 500 MG tablet Take 1 tablet (500 mg total) by mouth every 6 (six) hours as needed for muscle spasms. Patient not taking: Reported on 03/22/2016 03/06/16   Lajean Manes, PA-C    Family History Family History  Problem Relation Age of Onset  .  Heart attack Father   . Alcohol abuse Father     Heavy smoker and drinker  . Cancer Brother     base of tongue    Social History Social History  Substance Use Topics  . Smoking status: Former Smoker    Packs/day: 1.00    Years: 20.00    Types: Cigarettes    Quit date: 02/11/1979  . Smokeless tobacco: Never Used  . Alcohol use 12.6 oz/week    21 Glasses of wine per week     Comment: 2-3 glasses of wine daily     Allergies   Patient has no known allergies.   Review of Systems Review of Systems  Respiratory: Positive for shortness of breath.     Cardiovascular: Positive for chest pain. Negative for palpitations.  All other systems reviewed and are negative.    Physical Exam Updated Vital Signs BP 124/86   Pulse 81   Temp 98.5 F (36.9 C) (Oral)   Resp 19   Ht 6\' 2"  (1.88 m)   Wt 211 lb 2 oz (95.8 kg)   SpO2 99%   BMI 27.11 kg/m   Physical Exam  Constitutional: He is oriented to person, place, and time. He appears well-nourished.  HENT:  Head: Normocephalic.  Eyes: Conjunctivae are normal.  Cardiovascular: Normal rate and regular rhythm.   Pulmonary/Chest: Effort normal and breath sounds normal. No respiratory distress. He has no wheezes.  Musculoskeletal:  Right knee with swelling, well-healing. Distal to the right knee worsening edema compared to left.  Neurological: He is oriented to person, place, and time.  Skin: Skin is warm and dry. He is not diaphoretic.  Psychiatric: He has a normal mood and affect. His behavior is normal.     ED Treatments / Results  Labs (all labs ordered are listed, but only abnormal results are displayed) Labs Reviewed  CBC WITH DIFFERENTIAL/PLATELET - Abnormal; Notable for the following:       Result Value   RBC 3.80 (*)    HCT 37.8 (*)    MCH 34.2 (*)    Monocytes Absolute 1.2 (*)    All other components within normal limits  COMPREHENSIVE METABOLIC PANEL - Abnormal; Notable for the following:    Chloride 100 (*)    Glucose, Bld 155 (*)    All other components within normal limits  HEPARIN LEVEL (UNFRACTIONATED)  I-STAT TROPOININ, ED    EKG  EKG Interpretation  Date/Time:  Tuesday March 22 2016 20:32:12 EST Ventricular Rate:  97 PR Interval:    QRS Duration: 84 QT Interval:  322 QTC Calculation: 408 R Axis:   9 Text Interpretation:  no evidence of acute ischema.  Confirmed by Gerald Leitz ([id number]) on 03/23/2016 12:47:24 AM       Radiology Dg Chest 2 View  Result Date: 03/22/2016 CLINICAL DATA:  Shortness of breath. EXAM: CHEST  2 VIEW COMPARISON:   Radiographs of April 16, 2015. FINDINGS: The heart size and mediastinal contours are within normal limits. No pneumothorax is noted. Left lung is clear. Mild right basilar subsegmental atelectasis or infiltrate is noted with minimal right pleural effusion. The visualized skeletal structures are unremarkable. IMPRESSION: Mild right basilar subsegmental atelectasis or infiltrate with associated minimal right pleural effusion. Electronically Signed   By: Marijo Conception, M.D.   On: 03/22/2016 21:13   Ct Angio Chest Pe W And/or Wo Contrast  Result Date: 03/23/2016 CLINICAL DATA:  Shortness of breath and pleuritic chest pain. Recent knee surgery. EXAM: CT  ANGIOGRAPHY CHEST WITH CONTRAST TECHNIQUE: Multidetector CT imaging of the chest was performed using the standard protocol during bolus administration of intravenous contrast. Multiplanar CT image reconstructions and MIPs were obtained to evaluate the vascular anatomy. CONTRAST:  100 cc Isovue 370 IV COMPARISON:  Chest CT 11/25/2015 FINDINGS: Cardiovascular: Examination is positive for bilateral pulmonary emboli. On the left this involves the distal lobar and segmental branches to the left lower lobe and lingula. On the right this involves the segmental branches of the lower lobe extending into the subsegmental branches distally. There is evidence of right heart strain with RV to LV ratio of 1.28. Trace contrast refluxes into the IVC There is mild multi chamber cardiomegaly. Thoracic aortic atherosclerosis without evidence of dissection. Coronary artery calcifications. Mediastinum/Nodes: No pericardial effusion. No mediastinal or hilar adenopathy. Mild distal esophageal thickening is again seen. Lungs/Pleura: Lower lobe atelectasis, right greater than left. No definite pulmonary infarct. There is a small dependent right pleural effusion. Central airway thickening is seen. Motion artifact partially obscures lower lobe evaluation. Upper Abdomen: Atherosclerosis. No  acute abnormality. 11 mm splenic artery aneurysm is unchanged. Musculoskeletal: There are no acute or suspicious osseous abnormalities. Review of the MIP images confirms the above findings. IMPRESSION: Positive for bilateral segmental pulmonary emboli, greatest clot burden in the right lower lobe. Positive for CT evidence of right heart strain (RV/LV Ratio = 1.28) consistent with at least submassive (intermediate risk) PE. The presence of right heart strain has been associated with an increased risk of morbidity and mortality. Please activate Code PE by paging [phone]. Critical Value/emergent results were called by telephone at the time of interpretation on 03/23/2016 at 12:46 am to Dr. Zenovia Jarred , who verbally acknowledged these results. Electronically Signed   By: Jeb Levering M.D.   On: 03/23/2016 00:46    Procedures Procedures (including critical care time)  Medications Ordered in ED Medications  heparin bolus via infusion 6,000 Units (not administered)  heparin ADULT infusion 100 units/mL (25000 units/228mL sodium chloride 0.45%) (not administered)  fentaNYL (SUBLIMAZE) injection 50 mcg (not administered)  fentaNYL (SUBLIMAZE) injection 25 mcg (25 mcg Intravenous Given 03/22/16 2342)  iopamidol (ISOVUE-370) 76 % injection (100 mLs  Contrast Given 03/22/16 2350)     Initial Impression / Assessment and Plan / ED Course  I have reviewed the triage vital signs and the nursing notes.  Pertinent labs & imaging results that were available during my care of the patient were reviewed by me and considered in my medical decision making (see chart for details).  Clinical Course     Patient is a 70 year old male status post knee surgery recently. Patient presenting with shortness breath, pleuritic chest pain to the right-hand side. Concern for pulmonary embolism. We'll get labs, do CT angio. Patient does have swelling to the right lower leg more than left lower leg although this is  typical surgery.  CT positive for PE.   Heparin ordered.  Discussed with crit care, no need for ICU admission at this time.  Will admit to medicine.   Final Clinical Impressions(s) / ED Diagnoses   Final diagnoses:  None    New Prescriptions New Prescriptions   No medications on file     Earsel Shouse Julio Alm, MD 03/23/16 [phone]

## 2016-03-22 NOTE — ED Triage Notes (Signed)
Pt states he has knee replacement surgery 2 weeks ago; pt states as he was lying down he started having SOB with severe right side pain; pt states it feel like he has a cracked rib; pt states he took some pain meds prior to arrival that has decreased pain; Pt a&ox 4 on arrival; pt able to speak in full sentences on arrival. Pt c/o pain 4/10 on arrival.

## 2016-03-23 ENCOUNTER — Inpatient Hospital Stay (HOSPITAL_COMMUNITY): Payer: Medicare Other

## 2016-03-23 ENCOUNTER — Encounter (HOSPITAL_COMMUNITY): Payer: Self-pay | Admitting: Family Medicine

## 2016-03-23 DIAGNOSIS — R609 Edema, unspecified: Secondary | ICD-10-CM

## 2016-03-23 DIAGNOSIS — I482 Chronic atrial fibrillation: Secondary | ICD-10-CM

## 2016-03-23 DIAGNOSIS — I2699 Other pulmonary embolism without acute cor pulmonale: Secondary | ICD-10-CM | POA: Diagnosis present

## 2016-03-23 DIAGNOSIS — R0781 Pleurodynia: Secondary | ICD-10-CM | POA: Diagnosis present

## 2016-03-23 DIAGNOSIS — I2690 Septic pulmonary embolism without acute cor pulmonale: Secondary | ICD-10-CM

## 2016-03-23 DIAGNOSIS — C61 Malignant neoplasm of prostate: Secondary | ICD-10-CM | POA: Diagnosis not present

## 2016-03-23 DIAGNOSIS — E785 Hyperlipidemia, unspecified: Secondary | ICD-10-CM | POA: Diagnosis present

## 2016-03-23 DIAGNOSIS — Z96651 Presence of right artificial knee joint: Secondary | ICD-10-CM | POA: Diagnosis not present

## 2016-03-23 DIAGNOSIS — I2609 Other pulmonary embolism with acute cor pulmonale: Secondary | ICD-10-CM | POA: Diagnosis not present

## 2016-03-23 DIAGNOSIS — Z87891 Personal history of nicotine dependence: Secondary | ICD-10-CM | POA: Diagnosis not present

## 2016-03-23 DIAGNOSIS — R0602 Shortness of breath: Secondary | ICD-10-CM | POA: Diagnosis present

## 2016-03-23 DIAGNOSIS — Z8546 Personal history of malignant neoplasm of prostate: Secondary | ICD-10-CM | POA: Diagnosis not present

## 2016-03-23 DIAGNOSIS — Z79899 Other long term (current) drug therapy: Secondary | ICD-10-CM | POA: Diagnosis not present

## 2016-03-23 DIAGNOSIS — E871 Hypo-osmolality and hyponatremia: Secondary | ICD-10-CM | POA: Diagnosis not present

## 2016-03-23 DIAGNOSIS — Z7982 Long term (current) use of aspirin: Secondary | ICD-10-CM | POA: Diagnosis not present

## 2016-03-23 DIAGNOSIS — M109 Gout, unspecified: Secondary | ICD-10-CM | POA: Diagnosis present

## 2016-03-23 DIAGNOSIS — Z86010 Personal history of colonic polyps: Secondary | ICD-10-CM | POA: Diagnosis not present

## 2016-03-23 DIAGNOSIS — R739 Hyperglycemia, unspecified: Secondary | ICD-10-CM | POA: Diagnosis present

## 2016-03-23 DIAGNOSIS — I481 Persistent atrial fibrillation: Secondary | ICD-10-CM | POA: Diagnosis present

## 2016-03-23 LAB — URINALYSIS, ROUTINE W REFLEX MICROSCOPIC
Bilirubin Urine: NEGATIVE
Glucose, UA: NEGATIVE mg/dL
Hgb urine dipstick: NEGATIVE
Ketones, ur: NEGATIVE mg/dL
Leukocytes, UA: NEGATIVE
Nitrite: NEGATIVE
Protein, ur: NEGATIVE mg/dL
Specific Gravity, Urine: 1.039 — ABNORMAL HIGH (ref 1.005–1.030)
pH: 5 (ref 5.0–8.0)

## 2016-03-23 LAB — HEPARIN LEVEL (UNFRACTIONATED)
Heparin Unfractionated: 0.47 IU/mL (ref 0.30–0.70)
Heparin Unfractionated: 0.32 [IU]/mL (ref 0.30–0.70)

## 2016-03-23 LAB — ECHOCARDIOGRAM COMPLETE
HEIGHTINCHES: 74 in
WEIGHTICAEL: 3513.25 [oz_av]

## 2016-03-23 LAB — MRSA PCR SCREENING: MRSA by PCR: NEGATIVE

## 2016-03-23 MED ORDER — HEPARIN BOLUS VIA INFUSION
6000.0000 [IU] | Freq: Once | INTRAVENOUS | Status: AC
  Administered 2016-03-23: 6000 [IU] via INTRAVENOUS
  Filled 2016-03-23: qty 6000

## 2016-03-23 MED ORDER — FENTANYL CITRATE (PF) 100 MCG/2ML IJ SOLN
50.0000 ug | Freq: Once | INTRAMUSCULAR | Status: AC
  Administered 2016-03-23: 50 ug via INTRAVENOUS
  Filled 2016-03-23: qty 2

## 2016-03-23 MED ORDER — HEPARIN (PORCINE) IN NACL 100-0.45 UNIT/ML-% IJ SOLN
1500.0000 [IU]/h | INTRAMUSCULAR | Status: DC
  Administered 2016-03-23: 1500 [IU]/h via INTRAVENOUS
  Filled 2016-03-23 (×2): qty 250

## 2016-03-23 MED ORDER — POLYETHYLENE GLYCOL 3350 17 G PO PACK
17.0000 g | PACK | Freq: Every day | ORAL | Status: DC
  Administered 2016-03-23 – 2016-03-26 (×4): 17 g via ORAL
  Filled 2016-03-23 (×4): qty 1

## 2016-03-23 MED ORDER — SODIUM CHLORIDE 0.9 % IV SOLN
INTRAVENOUS | Status: AC
  Administered 2016-03-23: 12:00:00 via INTRAVENOUS
  Filled 2016-03-23: qty 1000

## 2016-03-23 MED ORDER — ACETAMINOPHEN 325 MG PO TABS: 650.0000 mg | ORAL_TABLET | Freq: Four times a day (QID) | ORAL | Status: DC | PRN

## 2016-03-23 MED ORDER — ALLOPURINOL 300 MG PO TABS
300.0000 mg | ORAL_TABLET | Freq: Every day | ORAL | Status: DC
  Administered 2016-03-23 – 2016-03-26 (×4): 300 mg via ORAL
  Filled 2016-03-23 (×4): qty 1

## 2016-03-23 MED ORDER — ACETAMINOPHEN 650 MG RE SUPP: 650.0000 mg | Freq: Four times a day (QID) | RECTAL | Status: DC | PRN

## 2016-03-23 MED ORDER — ONDANSETRON HCL 4 MG/2ML IJ SOLN: 4.0000 mg | Freq: Four times a day (QID) | INTRAMUSCULAR | Status: DC | PRN

## 2016-03-23 MED ORDER — SIMVASTATIN 40 MG PO TABS
40.0000 mg | ORAL_TABLET | Freq: Every day | ORAL | Status: DC
  Administered 2016-03-23 – 2016-03-25 (×3): 40 mg via ORAL
  Filled 2016-03-23 (×3): qty 1

## 2016-03-23 MED ORDER — HYDROMORPHONE HCL 1 MG/ML IJ SOLN
1.0000 mg | INTRAMUSCULAR | Status: DC | PRN
  Administered 2016-03-23 – 2016-03-24 (×4): 1 mg via INTRAVENOUS
  Filled 2016-03-23 (×4): qty 1

## 2016-03-23 MED ORDER — HEPARIN (PORCINE) IN NACL 100-0.45 UNIT/ML-% IJ SOLN
1650.0000 [IU]/h | INTRAMUSCULAR | Status: DC
  Administered 2016-03-24 – 2016-03-25 (×2): 1650 [IU]/h via INTRAVENOUS
  Filled 2016-03-23 (×3): qty 250

## 2016-03-23 MED ORDER — ONDANSETRON HCL 4 MG PO TABS: 4.0000 mg | ORAL_TABLET | Freq: Four times a day (QID) | ORAL | Status: DC | PRN

## 2016-03-23 MED ORDER — HYDROMORPHONE HCL 2 MG PO TABS
2.0000 mg | ORAL_TABLET | ORAL | Status: DC | PRN
  Administered 2016-03-23: 2 mg via ORAL
  Administered 2016-03-23 – 2016-03-24 (×4): 4 mg via ORAL
  Administered 2016-03-24: 2 mg via ORAL
  Administered 2016-03-25 – 2016-03-26 (×3): 4 mg via ORAL
  Filled 2016-03-23 (×4): qty 2
  Filled 2016-03-23: qty 1
  Filled 2016-03-23: qty 2
  Filled 2016-03-23: qty 1
  Filled 2016-03-23 (×2): qty 2

## 2016-03-23 MED ORDER — PANTOPRAZOLE SODIUM 40 MG PO TBEC
80.0000 mg | DELAYED_RELEASE_TABLET | Freq: Every day | ORAL | Status: DC
Start: 2016-03-23 — End: 2016-03-26
  Administered 2016-03-23 – 2016-03-26 (×4): 80 mg via ORAL
  Filled 2016-03-23 (×4): qty 2

## 2016-03-23 NOTE — ED Notes (Signed)
Wife's phone numbers       "[name]"  [id number] home phone  [phone] cell  [phone] other cell

## 2016-03-23 NOTE — Progress Notes (Addendum)
PROGRESS NOTE  Zachary Sanchez [id number] DOB: May 03, 1945 DOA: 03/22/2016 PCP: Marylene Land, MD  Brief History:  70 year old male with a history of persistent atrial fibrillation, prostate cancer status post radical prostatectomy 07/09/2015 presented with acute onset of right sided chest pain that began on the evening of 03/22/2016. The patient had associated shortness of breath. The patient recently underwent a right total knee arthroplasty on 03/04/2016 performed by Dr. Sydnee Cabal. He was sent home with aspirin 325 mg twice a day for DVT prophylaxis. The patient stated that on approximately two thirds of the days prior to this admission, he only took two ASA 81 mg tabs daily.  On the other one third of the days, the patient stated he took aspirin 325 mg once daily. Patient took his oral Dilaudid with some relief initially, but when his shortness of breath and chest pain returned, EMS was activated. CT angiogram of the chest in the emergency department revealed bilateral segmental pulmonary emboli raises burden in the RLL with evidence of RV strain. The patient was admitted and started on intravenous heparin.  Assessment/Plan: Acute submassive pulmonary embolus -Appears to be provoked by clinical history -Continue intravenous heparin -Echocardiogram -Venous duplex lower extremities -Presently hemodynamically stable with oxygen saturation 94-95 percent on room air  Permanent atrial fibrillation -CHADS-VASc = 1 -previously on ASA 81 mg daily -pt requesting cardiology consult which I have placed -Rate controlled  Hyperlipidemia -Continue simvastatin  S/P Right total knee arthroplasty -Dr. Sydnee Cabal 03/04/16 -resume PT when stable  Hyperglycemia -A1C  Low grade fever -likely due to acute VTE -UA and culture    Disposition Plan:   Home in 2-3 days ; remain in stepdown Family Communication:   No Family at bedside--Total time spent 35 minutes.  Greater than  50% spent face to face counseling and coordinating care.  0745 to 0820  Consultants:  Cardiology  Code Status:  FULL  DVT Prophylaxis:  IV heparin    Procedures: As Listed in Progress Note Above  Antibiotics: None    Subjective: Patient continues to have right-sided chest pain with deep inspiration. States that the pain is controlled with opioids. It is sharp in nature without radiation. He is breathing comfortably. Denies any headache, dizziness, nausea, vomiting, diarrhea, abdominal pain. No dysuria or hematuria.  Objective: Vitals:   03/23/16 0200 03/23/16 0230 03/23/16 0300 03/23/16 0318  BP: 120/76 120/78 134/97 124/78  Pulse: 75 80 67 79  Resp: 20 26 23  (!) 28  Temp:    (!) 100.8 F (38.2 C)  TempSrc:    Oral  SpO2: 95% 96% 96% 95%  Weight:    99.6 kg (219 lb 9.3 oz)  Height:    6\' 2"  (1.88 m)    Intake/Output Summary (Last 24 hours) at 03/23/16 0815 Last data filed at 03/23/16 0600  Gross per 24 hour  Intake               90 ml  Output                0 ml  Net               90 ml   Weight change:  Exam:   General:  Pt is alert, follows commands appropriately, not in acute distress  HEENT: No icterus, No thrush, No neck mass, [place]/AT  Cardiovascular: IRRR, S1/S2, no rubs, no gallops  Respiratory: Bibasilar rales, right greater than left. No wheezing.  Abdomen: Soft/+BS, non tender, non distended, no guarding  Extremities: 1 + RLE edema, No lymphangitis, No petechiae, No rashes, no synovitis   Data Reviewed: I have personally reviewed following labs and imaging studies Basic Metabolic Panel:  Recent Labs Lab 03/22/16 2253  NA 135  K 3.8  CL 100*  CO2 23  GLUCOSE 155*  BUN 8  CREATININE 0.92  CALCIUM 9.7   Liver Function Tests:  Recent Labs Lab 03/22/16 2253  AST 37  ALT 29  ALKPHOS 63  BILITOT 1.1  PROT 7.2  ALBUMIN 3.5   No results for input(s): LIPASE, AMYLASE in the last 168 hours. No results for input(s): AMMONIA in the  last 168 hours. Coagulation Profile: No results for input(s): INR, PROTIME in the last 168 hours. CBC:  Recent Labs Lab 03/22/16 2253  WBC 9.9  NEUTROABS 6.7  HGB 13.0  HCT 37.8*  MCV 99.5  PLT 322   Cardiac Enzymes: No results for input(s): CKTOTAL, CKMB, CKMBINDEX, TROPONINI in the last 168 hours. BNP: Invalid input(s): POCBNP CBG: No results for input(s): GLUCAP in the last 168 hours. HbA1C: No results for input(s): HGBA1C in the last 72 hours. Urine analysis:    Component Value Date/Time   COLORURINE AMBER (A) 02/23/2016 1017   APPEARANCEUR CLEAR 02/23/2016 1017   LABSPEC 1.026 02/23/2016 1017   PHURINE 7.5 02/23/2016 1017   GLUCOSEU NEGATIVE 02/23/2016 1017   HGBUR NEGATIVE 02/23/2016 1017   BILIRUBINUR NEGATIVE 02/23/2016 1017   KETONESUR NEGATIVE 02/23/2016 1017   PROTEINUR NEGATIVE 02/23/2016 1017   NITRITE NEGATIVE 02/23/2016 1017   LEUKOCYTESUR TRACE (A) 02/23/2016 1017   Sepsis Labs: @LABRCNTIP (procalcitonin:4,lacticidven:4) )No results found for this or any previous visit (from the past 240 hour(s)).   Scheduled Meds: . allopurinol  300 mg Oral Daily  . pantoprazole  80 mg Oral Daily  . simvastatin  40 mg Oral QHS   Continuous Infusions: . heparin 1,500 Units/hr (03/23/16 0200)    Procedures/Studies: Dg Chest 2 View  Result Date: 03/22/2016 CLINICAL DATA:  Shortness of breath. EXAM: CHEST  2 VIEW COMPARISON:  Radiographs of April 16, 2015. FINDINGS: The heart size and mediastinal contours are within normal limits. No pneumothorax is noted. Left lung is clear. Mild right basilar subsegmental atelectasis or infiltrate is noted with minimal right pleural effusion. The visualized skeletal structures are unremarkable. IMPRESSION: Mild right basilar subsegmental atelectasis or infiltrate with associated minimal right pleural effusion. Electronically Signed   By: Marijo Conception, M.D.   On: 03/22/2016 21:13   Ct Angio Chest Pe W And/or Wo  Contrast  Result Date: 03/23/2016 CLINICAL DATA:  Shortness of breath and pleuritic chest pain. Recent knee surgery. EXAM: CT ANGIOGRAPHY CHEST WITH CONTRAST TECHNIQUE: Multidetector CT imaging of the chest was performed using the standard protocol during bolus administration of intravenous contrast. Multiplanar CT image reconstructions and MIPs were obtained to evaluate the vascular anatomy. CONTRAST:  100 cc Isovue 370 IV COMPARISON:  Chest CT 11/25/2015 FINDINGS: Cardiovascular: Examination is positive for bilateral pulmonary emboli. On the left this involves the distal lobar and segmental branches to the left lower lobe and lingula. On the right this involves the segmental branches of the lower lobe extending into the subsegmental branches distally. There is evidence of right heart strain with RV to LV ratio of 1.28. Trace contrast refluxes into the IVC There is mild multi chamber cardiomegaly. Thoracic aortic atherosclerosis without evidence of dissection. Coronary artery calcifications. Mediastinum/Nodes: No pericardial effusion. No mediastinal or hilar adenopathy.  Mild distal esophageal thickening is again seen. Lungs/Pleura: Lower lobe atelectasis, right greater than left. No definite pulmonary infarct. There is a small dependent right pleural effusion. Central airway thickening is seen. Motion artifact partially obscures lower lobe evaluation. Upper Abdomen: Atherosclerosis. No acute abnormality. 11 mm splenic artery aneurysm is unchanged. Musculoskeletal: There are no acute or suspicious osseous abnormalities. Review of the MIP images confirms the above findings. IMPRESSION: Positive for bilateral segmental pulmonary emboli, greatest clot burden in the right lower lobe. Positive for CT evidence of right heart strain (RV/LV Ratio = 1.28) consistent with at least submassive (intermediate risk) PE. The presence of right heart strain has been associated with an increased risk of morbidity and mortality.  Please activate Code PE by paging [phone]. Critical Value/emergent results were called by telephone at the time of interpretation on 03/23/2016 at 12:46 am to Dr. Zenovia Jarred , who verbally acknowledged these results. Electronically Signed   By: Jeb Levering M.D.   On: 03/23/2016 00:46    Keniya Schlotterbeck, DO  [facility] Pager [phone]  If 7PM-7AM, please contact night-coverage [email or web address] Password [email or web address] 03/23/2016, 8:15 AM   LOS: 0 days

## 2016-03-23 NOTE — Progress Notes (Signed)
ANTICOAGULATION CONSULT NOTE - Follow Up Consult  Pharmacy Consult for Heparin Indication: pulmonary embolus  No Known Allergies  Patient Measurements: Height: 6\' 2"  (188 cm) Weight: 219 lb 9.3 oz (99.6 kg) IBW/kg (Calculated) : 82.2 Heparin Dosing Weight: 95.8 kg  Vital Signs: Temp: 98.2 F (36.8 C) (12/06 0826) Temp Source: Oral (12/06 0826) BP: 108/76 (12/06 0826) Pulse Rate: 74 (12/06 0826)  Labs:  Recent Labs  03/22/16 2253 03/23/16 0911  HGB 13.0  --   HCT 37.8*  --   PLT 322  --   HEPARINUNFRC  --  0.47  CREATININE 0.92  --     Estimated Creatinine Clearance: 94.3 mL/min (by C-G formula based on SCr of 0.92 mg/dL).   Assessment:  Anticoag: New PE s/p R TKA.No DVT. HL 0.47. CBC WNL.  Goal of Therapy:  Heparin level 0.3-0.7 units/ml Monitor platelets by anticoagulation protocol: Yes   Plan:  Continue heparin drip at 1500 units/hr Confirm level in 6 hrs. Daily CBC/HL  Nuh Lipton S. Alford Highland, PharmD, BCPS Clinical Staff Pharmacist Pager [phone]  Eilene Ghazi Stillinger 03/23/2016,11:44 AM

## 2016-03-23 NOTE — Addendum Note (Signed)
Addendum  created 03/23/16 1136 by [name], MD   Actions taken from a BestPractice Advisory, Order sets accessed

## 2016-03-23 NOTE — Progress Notes (Signed)
ANTICOAGULATION CONSULT NOTE - Follow Up Consult  Pharmacy Consult for Heparin Indication: pulmonary embolus  No Known Allergies  Patient Measurements: Height: 6\' 2"  (188 cm) Weight: 219 lb 9.3 oz (99.6 kg) IBW/kg (Calculated) : 82.2 Heparin Dosing Weight: 95.8 kg  Vital Signs: Temp: 97.5 F (36.4 C) (12/06 1616) Temp Source: Oral (12/06 1616) BP: 105/72 (12/06 1616) Pulse Rate: 79 (12/06 1616)  Labs:  Recent Labs  03/22/16 2253 03/23/16 0911 03/23/16 1521  HGB 13.0  --   --   HCT 37.8*  --   --   PLT 322  --   --   HEPARINUNFRC  --  0.47 0.32  CREATININE 0.92  --   --     Estimated Creatinine Clearance: 94.3 mL/min (by C-G formula based on SCr of 0.92 mg/dL).   Assessment: New PE s/p R TKA. No DVT.  2nd heparin level is 0.32, therapeutic but decreased from previous hepairn of  0.47 on same heparin rate of 1500 units/hr. Possibly due to effect of bol CBC WNL.  No bleeding reported.   Goal of Therapy:  Heparin level 0.3-0.7 units/ml Monitor platelets by anticoagulation protocol: Yes   Plan:  Increase heparin drip at 1650 units/hr to keep HL = 0.3-0.7 for acute PE.  Daily CBC/HL  Thank you for allowing pharmacy to be part of this patients care team. Nicole Cella, RPh Clinical Pharmacist Pager: [phone] 03/23/2016,5:23 PM

## 2016-03-23 NOTE — Progress Notes (Signed)
*  PRELIMINARY RESULTS* Vascular Ultrasound BIlateral lower extremity venous duplex has been completed.  Preliminary findings: No evidence of deep vein thrombosis in the visualized veins of the lower extremities. Negative for baker's cysts bilaterally.   Everrett Coombe 03/23/2016, 10:35 AM

## 2016-03-23 NOTE — Consult Note (Signed)
CARDIOLOGY CONSULT NOTE     Primary Care Physician: Marylene Land, MD Referring Physician:  Dr Gerilyn Nestle Date: 03/22/2016  Reason for consultation:  AFib  Zachary Sanchez is a 70 y.o. male with a h/o permanent afib well known to me who is now admitted with bilateral, PTE.  He is s/p recent knee surgery.  He has noticed some leg swelling primarily in the ankle.  Last night, he had severe chest pain with associated shortness of breath.  He presents and is found to have extensive PTE.  He has been placed on heparin.  He is hemodynamically stable.  Today, he denies symptoms of palpitations,  orthopnea, PND, lower extremity edema, dizziness, presyncope, syncope, or neurologic sequela. The patient is tolerating medications without difficulties and is otherwise without complaint today.   Past Medical History:  Diagnosis Date  . Aortic root enlargement (HCC)    aortic root 75mm in size  . Atrial flutter ([place])   . Biatrial enlargement   . Colonic polyp   . DJD (degenerative joint disease)   . Dysrhythmia   . Gout   . History of benign prostatic hypertrophy   . Hyperlipidemia   . Persistent atrial fibrillation (HCC)    longstanding persistent, asymptomatic  . Prostate cancer ([place])   . Wears glasses    Past Surgical History:  Procedure Laterality Date  . FRACTURE SURGERY     ankles and fingers  . KNEE SURGERY     right; menicus tear   . LYMPHADENECTOMY Bilateral 07/09/2015   Procedure: PELVIC LYMPHADENECTOMY;  Surgeon: Raynelle Bring, MD;  Location: [facility];  Service: Urology;  Laterality: Bilateral;  . ROBOT ASSISTED LAPAROSCOPIC RADICAL PROSTATECTOMY N/A 07/09/2015   Procedure: XI ROBOTIC ASSISTED LAPAROSCOPIC RADICAL PROSTATECTOMY LEVEL 2;  Surgeon: Raynelle Bring, MD;  Location: [facility];  Service: Urology;  Laterality: N/A;  . TONSILLECTOMY    . TOTAL KNEE ARTHROPLASTY Right 03/04/2016   Procedure: RIGHT TOTAL KNEE ARTHROPLASTY;  Surgeon: Sydnee Cabal, MD;  Location: [facility];   Service: Orthopedics;  Laterality: Right;    . allopurinol  300 mg Oral Daily  . pantoprazole  80 mg Oral Daily  . polyethylene glycol  17 g Oral Daily  . simvastatin  40 mg Oral QHS   . heparin 1,500 Units/hr (03/23/16 0800)  . sodium chloride 0.9 % 1,000 mL with potassium chloride 20 mEq infusion 75 mL/hr at 03/23/16 1223    No Known Allergies  Social History   Social History  . Marital status: Married    Spouse name: N/A  . Number of children: N/A  . Years of education: N/A   Occupational History  . Financial Planning [employer]   Social History Main Topics  . Smoking status: Former Smoker    Packs/day: 1.00    Years: 20.00    Types: Cigarettes    Quit date: 02/11/1979  . Smokeless tobacco: Never Used  . Alcohol use 12.6 oz/week    21 Glasses of wine per week     Comment: 2-3 glasses of wine daily  . Drug use: No  . Sexual activity: Not Currently   Other Topics Concern  . Not on file   Social History Narrative  . No narrative on file    Family History  Problem Relation Age of Onset  . Heart attack Father   . Alcohol abuse Father     Heavy smoker and drinker  . Cancer Brother     base of tongue  ROS- All systems are reviewed and negative except as per the HPI above  Physical Exam: Telemetry: Vitals:   03/23/16 0300 03/23/16 0318 03/23/16 0826 03/23/16 1157  BP: 134/97 124/78 108/76 117/77  Pulse: 67 79 74 74  Resp: 23 (!) 28 (!) 23 19  Temp:  (!) 100.8 F (38.2 C) 98.2 F (36.8 C) 98.3 F (36.8 C)  TempSrc:  Oral Oral Oral  SpO2: 96% 95% 95% 95%  Weight:  219 lb 9.3 oz (99.6 kg)    Height:  6\' 2"  (1.88 m)      GEN- The patient is well appearing, alert and oriented x 3 today.   Head- normocephalic, atraumatic Eyes-  Sclera clear, conjunctiva pink Ears- hearing intact Oropharynx- clear Neck- supple,   Lungs- Clear to ausculation bilaterally, normal work of breathing Heart- irregular rate and rhythm,   GI- soft, NT, ND, +  BS Extremities- no clubbing, cyanosis, +1 edema (R ankle), no homans or cords MS- no significant deformity or atrophy Skin- no rash or lesion Psych- euthymic mood, full affect Neuro- strength and sensation are intact  EKG: reviewed  Labs:   Lab Results  Component Value Date   WBC 9.9 03/22/2016   HGB 13.0 03/22/2016   HCT 37.8 (L) 03/22/2016   MCV 99.5 03/22/2016   PLT 322 03/22/2016    Recent Labs Lab 03/22/16 2253  NA 135  K 3.8  CL 100*  CO2 23  BUN 8  CREATININE 0.92  CALCIUM 9.7  PROT 7.2  BILITOT 1.1  ALKPHOS 63  ALT 29  AST 37  GLUCOSE 155*   No results found for: CKTOTAL, CKMB, CKMBINDEX, TROPONINI No results found for: CHOL No results found for: HDL No results found for: LDLCALC No results found for: TRIG No results found for: CHOLHDL No results found for: LDLDIRECT    Radiology:CTA reviewed  Echo:  reviewed  ASSESSMENT AND PLAN:    1. PTE Acute and extensive PTE Some R heart strain noted on CTA Echo is less impressive Clinically looks stable Continue IV heparin for now If remains stable, would convert to either Xarelto or eliquis at therapeutic PTE dosing in next 1-2 days  2. Permanent afib Stable Rate controlled  Appreciate great care by hospitalists team I will follow from afar Please call with questions   Thompson Grayer, MD 03/23/2016  3:51 PM

## 2016-03-23 NOTE — Progress Notes (Addendum)
[facility] @ [employer] RX # [id number] OPT-1   ELIQUIS 5 MG BID  30/ 60 TAB   COVER- YES  CO-PAY- $ 74.00  TIER- 3 DRUG  PRIOR APPROVAL- YES # [id number] OPT- 5  PHARMACY : [facility]    xarelto  Pt copay will be $37- prior auth not required

## 2016-03-23 NOTE — Progress Notes (Signed)
ANTICOAGULATION CONSULT NOTE - Initial Consult  Pharmacy Consult for Heparin  Indication: pulmonary embolus, new onset  No Known Allergies  Patient Measurements: Height: 6\' 2"  (188 cm) Weight: 211 lb 2 oz (95.8 kg) IBW/kg (Calculated) : 82.2  Vital Signs: Temp: 98.5 F (36.9 C) (12/05 2026) Temp Source: Oral (12/05 2026) BP: 124/86 (12/06 0041) Pulse Rate: 81 (12/06 0041)  Labs:  Recent Labs  03/22/16 2253  HGB 13.0  HCT 37.8*  PLT 322  CREATININE 0.92    Estimated Creatinine Clearance: 86.9 mL/min (by C-G formula based on SCr of 0.92 mg/dL).   Medical History: Past Medical History:  Diagnosis Date  . Aortic root enlargement (HCC)    aortic root 50mm in size  . Atrial flutter ([place])   . Biatrial enlargement   . Colonic polyp   . DJD (degenerative joint disease)   . Dysrhythmia   . Gout   . History of benign prostatic hypertrophy   . Hyperlipidemia   . Persistent atrial fibrillation (HCC)    longstanding persistent, asymptomatic  . Prostate cancer ([place])   . Wears glasses    Assessment: 71 y/o M s/p right knee arthroplasty on 11/17 now here with new onset PE, DVT prophylaxis at discharge was twice daily Aspirin. CBC/renal function good. PTA meds reviewed.   Goal of Therapy:  Heparin level 0.3-0.7 units/ml Monitor platelets by anticoagulation protocol: Yes   Plan:  Heparin 6000 units BOLUS Start heparin drip at 1500 units/hr 1000 HL Daily CBC/HL Monitor for bleeding  Narda Bonds 03/23/2016,12:56 AM

## 2016-03-23 NOTE — H&P (Signed)
History and Physical  Patient Name: Zachary Sanchez     [id number]    DOB: [birth year]    DOA: 03/22/2016 PCP: Marylene Land, MD   Patient coming from: Home  Chief Complaint: Chest pain  HPI: Zachary Sanchez is a 69 y.o. male with a past medical history significant for lone Afib on aspirin who presents with acute chest pain.  The patient underwent right TKA about 2 weeks ago at [facility]. His postoperative course was uneventful, and he was discharged home and is been [place] well. He has been taking aspirin 325 mg daily for DVT prophylaxis. Has noted some asymmetric swelling of his leg, presumed normal postoperatively.  Tonight he was laying in bed on his right side, when he noticed right-sided rib pain that was new and severe. He couldn't get comfortable so he sat up which helped slightly, but noticed his breathing was hard and that taking a breath made the pain sharply worse. He called his PCP who recommended he be evaluated in the emergency room for blood clot.  ED course: -Afebrile, heart rate 96, respirations 20s, blood pressure 139/99, pulse oximetry 98% on room air -Na 135, K 3.8, Cr 0.9, WBC 9.9 K, Hgb 13, troponin negative -Chest x-ray showed possible right lower lobe infiltrate with small right pleural effusion -CT PE protocol showed bilateral PE to the lobar and segmental levels, RV TLC ratio 1.28, submassive -ECG showed atrial fibrillation with ST segment deviation -He was started on heparin and the case was discussed with intensivist who agreed with heparinization and hospitalist admission   He has no previous history of blood clot. He has no family history of blood clot. He has never had a bleeding episode. He drinks about 2-3 alcoholic drinks per night.    ROS: Review of Systems  Respiratory: Positive for shortness of breath. Negative for hemoptysis.   Cardiovascular: Positive for chest pain and leg swelling.  All other systems reviewed and are negative.          Past Medical History:  Diagnosis Date  . Aortic root enlargement (HCC)    aortic root 3mm in size  . Atrial flutter ([place])   . Biatrial enlargement   . Colonic polyp   . DJD (degenerative joint disease)   . Dysrhythmia   . Gout   . History of benign prostatic hypertrophy   . Hyperlipidemia   . Persistent atrial fibrillation (HCC)    longstanding persistent, asymptomatic  . Prostate cancer ([place])   . Wears glasses     Past Surgical History:  Procedure Laterality Date  . FRACTURE SURGERY     ankles and fingers  . KNEE SURGERY     right; menicus tear   . LYMPHADENECTOMY Bilateral 07/09/2015   Procedure: PELVIC LYMPHADENECTOMY;  Surgeon: Raynelle Bring, MD;  Location: [facility];  Service: Urology;  Laterality: Bilateral;  . ROBOT ASSISTED LAPAROSCOPIC RADICAL PROSTATECTOMY N/A 07/09/2015   Procedure: XI ROBOTIC ASSISTED LAPAROSCOPIC RADICAL PROSTATECTOMY LEVEL 2;  Surgeon: Raynelle Bring, MD;  Location: [facility];  Service: Urology;  Laterality: N/A;  . TONSILLECTOMY    . TOTAL KNEE ARTHROPLASTY Right 03/04/2016   Procedure: RIGHT TOTAL KNEE ARTHROPLASTY;  Surgeon: Sydnee Cabal, MD;  Location: [facility];  Service: Orthopedics;  Laterality: Right;    Social History: Patient lives with his wife.  The patient walks unassisted.  He has a very remote smoking history.  He is from [place].  He is a semi-retired Network engineer.    No Known Allergies  Family history: family history includes Alcohol abuse in his father; Cancer in his brother; Heart attack in his father.  No family history of DVT/PE.  Prior to Admission medications   Medication Sig Start Date End Date Taking? Authorizing Provider  allopurinol (ZYLOPRIM) 300 MG tablet Take 300 mg by mouth daily.     Yes Historical Provider, MD  aspirin EC 325 MG tablet Take 1 tablet (325 mg total) by mouth 2 (two) times daily. 03/06/16  Yes Bryson L Stilwell, PA-C  cholecalciferol (VITAMIN D) 1000 units tablet Take 1,000 Units by  mouth daily.   Yes Historical Provider, MD  HYDROmorphone (DILAUDID) 2 MG tablet Take 1-2 tablets (2-4 mg total) by mouth every 4 (four) hours as needed for severe pain. 03/06/16  Yes Bryson L Stilwell, PA-C  omeprazole (PRILOSEC) 40 MG capsule Take 40 mg by mouth daily.   Yes Historical Provider, MD  simvastatin (ZOCOR) 40 MG tablet Take 40 mg by mouth at bedtime.     Yes Historical Provider, MD  methocarbamol (ROBAXIN) 500 MG tablet Take 1 tablet (500 mg total) by mouth every 6 (six) hours as needed for muscle spasms. Patient not taking: Reported on 03/22/2016 03/06/16   Lajean Manes, PA-C       Physical Exam: BP 120/76   Pulse 75   Temp 98.5 F (36.9 C) (Oral)   Resp 20   Ht 6\' 2"  (1.88 m)   Wt 95.8 kg (211 lb 2 oz)   SpO2 95%   BMI 27.11 kg/m  General appearance: Well-developed, adult male, alert and in no acute distress.   Eyes: Anicteric, conjunctiva pink, lids and lashes normal. PERRL.    ENT: No nasal deformity, discharge, epistaxis.  Hearing normal. OP moist without lesions.   Neck: No neck masses.  Trachea midline.  No thyromegaly/tenderness. Lymph: No cervical or supraclavicular lymphadenopathy. Skin: Warm and dry.  No jaundice.  No suspicious rashes or lesions. Cardiac: RRR, nl S1-S2, no murmurs appreciated.  Capillary refill is brisk.  JVP normal.  Mild nonpitting R LE edema.  Radial pulses 2+ and symmetric.  Left DP pulse 2+, right diminished. Respiratory: Increased respiratory rate without increased work of breathing or accessory muscle use.  Shallow inspirations.  No rales or wheezes noted. Abdomen: Abdomen soft.  No TTP. No ascites, distension, hepatosplenomegaly.   MSK: No deformities or effusions.  No cyanosis or clubbing. Neuro: Cranial nerves grossly normal.  Sensation intact to light touch. Speech is fluent.  Muscle strength normal.    Psych: Sensorium intact and responding to questions, attention normal.  Behavior appropriate.  Affect normal.  Judgment and  insight appear normal.     Labs on Admission:  I have personally reviewed following labs and imaging studies: CBC:  Recent Labs Lab 03/22/16 2253  WBC 9.9  NEUTROABS 6.7  HGB 13.0  HCT 37.8*  MCV 99.5  PLT [id number]   Basic Metabolic Panel:  Recent Labs Lab 03/22/16 2253  NA 135  K 3.8  CL 100*  CO2 23  GLUCOSE 155*  BUN 8  CREATININE 0.92  CALCIUM 9.7   GFR: Estimated Creatinine Clearance: 86.9 mL/min (by C-G formula based on SCr of 0.92 mg/dL).  Liver Function Tests:  Recent Labs Lab 03/22/16 2253  AST 37  ALT 29  ALKPHOS 63  BILITOT 1.1  PROT 7.2  ALBUMIN 3.5   No results for input(s): LIPASE, AMYLASE in the last 168 hours. No results for input(s): AMMONIA in the last 168 hours. Coagulation Profile:  No results for input(s): INR, PROTIME in the last 168 hours. Cardiac Enzymes: No results for input(s): CKTOTAL, CKMB, CKMBINDEX, TROPONINI in the last 168 hours. BNP (last 3 results) No results for input(s): PROBNP in the last 8760 hours. HbA1C: No results for input(s): HGBA1C in the last 72 hours. CBG: No results for input(s): GLUCAP in the last 168 hours. Lipid Profile: No results for input(s): CHOL, HDL, LDLCALC, TRIG, CHOLHDL, LDLDIRECT in the last 72 hours. Thyroid Function Tests: No results for input(s): TSH, T4TOTAL, FREET4, T3FREE, THYROIDAB in the last 72 hours. Anemia Panel: No results for input(s): VITAMINB12, FOLATE, FERRITIN, TIBC, IRON, RETICCTPCT in the last 72 hours. Sepsis Labs: Invalid input(s): PROCALCITONIN, LACTICIDVEN No results found for this or any previous visit (from the past 240 hour(s)).       Radiological Exams on Admission: Personally reviewed CXR showed no obvious focal opacity. Minor R effusion.  CT report reviewed: Dg Chest 2 View  Result Date: 03/22/2016 CLINICAL DATA:  Shortness of breath. EXAM: CHEST  2 VIEW COMPARISON:  Radiographs of April 16, 2015. FINDINGS: The heart size and mediastinal contours are within  normal limits. No pneumothorax is noted. Left lung is clear. Mild right basilar subsegmental atelectasis or infiltrate is noted with minimal right pleural effusion. The visualized skeletal structures are unremarkable. IMPRESSION: Mild right basilar subsegmental atelectasis or infiltrate with associated minimal right pleural effusion. Electronically Signed   By: Marijo Conception, M.D.   On: 03/22/2016 21:13   Ct Angio Chest Pe W And/or Wo Contrast  Result Date: 03/23/2016 CLINICAL DATA:  Shortness of breath and pleuritic chest pain. Recent knee surgery. EXAM: CT ANGIOGRAPHY CHEST WITH CONTRAST TECHNIQUE: Multidetector CT imaging of the chest was performed using the standard protocol during bolus administration of intravenous contrast. Multiplanar CT image reconstructions and MIPs were obtained to evaluate the vascular anatomy. CONTRAST:  100 cc Isovue 370 IV COMPARISON:  Chest CT 11/25/2015 FINDINGS: Cardiovascular: Examination is positive for bilateral pulmonary emboli. On the left this involves the distal lobar and segmental branches to the left lower lobe and lingula. On the right this involves the segmental branches of the lower lobe extending into the subsegmental branches distally. There is evidence of right heart strain with RV to LV ratio of 1.28. Trace contrast refluxes into the IVC There is mild multi chamber cardiomegaly. Thoracic aortic atherosclerosis without evidence of dissection. Coronary artery calcifications. Mediastinum/Nodes: No pericardial effusion. No mediastinal or hilar adenopathy. Mild distal esophageal thickening is again seen. Lungs/Pleura: Lower lobe atelectasis, right greater than left. No definite pulmonary infarct. There is a small dependent right pleural effusion. Central airway thickening is seen. Motion artifact partially obscures lower lobe evaluation. Upper Abdomen: Atherosclerosis. No acute abnormality. 11 mm splenic artery aneurysm is unchanged. Musculoskeletal: There are no  acute or suspicious osseous abnormalities. Review of the MIP images confirms the above findings. IMPRESSION: Positive for bilateral segmental pulmonary emboli, greatest clot burden in the right lower lobe. Positive for CT evidence of right heart strain (RV/LV Ratio = 1.28) consistent with at least submassive (intermediate risk) PE. The presence of right heart strain has been associated with an increased risk of morbidity and mortality. Please activate Code PE by paging [phone]. Critical Value/emergent results were called by telephone at the time of interpretation on 03/23/2016 at 12:46 am to Dr. Zenovia Jarred , who verbally acknowledged these results. Electronically Signed   By: Jeb Levering M.D.   On: 03/23/2016 00:46    EKG: Independently reviewed. Rate 97, Afib.  Diffuse TW changes, no ST segment deviations.       Assessment/Plan Principal Problem:   Pulmonary emboli (HCC) Active Problems:   Permanent atrial fibrillation (HCC)   Prostate cancer (HCC)   S/P knee replacement  1. Submassive acute PE:  Provoked.  First PE.  RV/LV ratio 1.28, but hemodynamically stable.   -Heparin gtt -Consult to CM for coverage of NOACs -Obtain echocardiogram and LE dopplers; if LV dysfunction or mobile clot, will d/w PCCM -Pain control with oral dilaudid or IV dilaudid overnight   2. Atrial fibrillation:  CHADS2Vasc 1.  Sees Dr. Rayann Heman.  On aspirin 162 at baseline.  Rate controlled. -Hold aspirin while on AC  3. Other medications:  -Continue simvastatin -Continue allopurinol -Continue PPI         DVT prophylaxis: N/A on heparin  Code Status: FULL  Family Communication: None present  Disposition Plan: Anticipate IV heparin and transition to oral AC when confirmed by CM.   Consults called: Intensivist, by phone, by EDP, not formally consulted at this time Admission status: INPATIENT, stepdown        Medical decision making: Patient seen at 2:30 AM on 03/23/2016.  The  patient was discussed with Dr. Dina Rich.  What exists of the patient's chart was reviewed in depth and summarized above.  Clinical condition: curently hemodynamically stable.        Edwin Dada [facility] Pager [phone]

## 2016-03-23 NOTE — Addendum Note (Signed)
Addendum  created 03/23/16 1147 by [name], MD   Anesthesia Intra Blocks edited, Sign clinical note

## 2016-03-24 DIAGNOSIS — E871 Hypo-osmolality and hyponatremia: Secondary | ICD-10-CM

## 2016-03-24 DIAGNOSIS — I2609 Other pulmonary embolism with acute cor pulmonale: Secondary | ICD-10-CM

## 2016-03-24 DIAGNOSIS — I2699 Other pulmonary embolism without acute cor pulmonale: Principal | ICD-10-CM

## 2016-03-24 LAB — HEPARIN LEVEL (UNFRACTIONATED): Heparin Unfractionated: 0.44 IU/mL (ref 0.30–0.70)

## 2016-03-24 LAB — HEMOGLOBIN A1C
HEMOGLOBIN A1C: 4.9 % (ref 4.8–5.6)
Mean Plasma Glucose: 94 mg/dL

## 2016-03-24 LAB — CBC
HEMATOCRIT: 34.7 % — AB (ref 39.0–52.0)
HEMOGLOBIN: 11.6 g/dL — AB (ref 13.0–17.0)
MCH: 33.6 pg (ref 26.0–34.0)
MCHC: 33.4 g/dL (ref 30.0–36.0)
MCV: 100.6 fL — AB (ref 78.0–100.0)
PLATELETS: 303 10*3/uL (ref 150–400)
RBC: 3.45 MIL/uL — AB (ref 4.22–5.81)
RDW: 13.3 % (ref 11.5–15.5)
WBC: 7.1 10*3/uL (ref 4.0–10.5)

## 2016-03-24 LAB — CREATININE, URINE, RANDOM: CREATININE, URINE: 113.3 mg/dL

## 2016-03-24 LAB — BASIC METABOLIC PANEL
Anion gap: 8 (ref 5–15)
BUN: 6 mg/dL (ref 6–20)
CHLORIDE: 99 mmol/L — AB (ref 101–111)
CO2: 25 mmol/L (ref 22–32)
CREATININE: 0.75 mg/dL (ref 0.61–1.24)
Calcium: 9.1 mg/dL (ref 8.9–10.3)
GFR calc Af Amer: >60 mL/min (ref >60)
GFR calc non Af Amer: >60 mL/min (ref >60)
GLUCOSE: 106 mg/dL — AB (ref 65–99)
Potassium: 4.1 mmol/L (ref 3.5–5.1)
Sodium: 132 mmol/L — ABNORMAL LOW (ref 135–145)

## 2016-03-24 LAB — OSMOLALITY: Osmolality: 277 mOsm/kg (ref 275–295)

## 2016-03-24 LAB — MAGNESIUM: Magnesium: 1.5 mg/dL — ABNORMAL LOW (ref 1.7–2.4)

## 2016-03-24 LAB — OSMOLALITY, URINE: Osmolality, Ur: 488 mOsm/kg (ref 300–900)

## 2016-03-24 LAB — SODIUM, URINE, RANDOM: SODIUM UR: 121 mmol/L

## 2016-03-24 MED ORDER — MAGNESIUM SULFATE 2 GM/50ML IV SOLN
2.0000 g | Freq: Once | INTRAVENOUS | Status: AC
  Administered 2016-03-24: 2 g via INTRAVENOUS
  Filled 2016-03-24: qty 50

## 2016-03-24 MED ORDER — SODIUM CHLORIDE 0.9 % IV SOLN
INTRAVENOUS | Status: AC
  Administered 2016-03-24 – 2016-03-25 (×2): via INTRAVENOUS
  Filled 2016-03-24 (×3): qty 1000

## 2016-03-24 NOTE — Progress Notes (Signed)
SUBJECTIVE: The patient is doing well today.  At this time, he denies chest pain, shortness of breath, or any new concerns.  Marland Kitchen allopurinol  300 mg Oral Daily  . magnesium sulfate 1 - 4 g bolus IVPB  2 g Intravenous Once  . pantoprazole  80 mg Oral Daily  . polyethylene glycol  17 g Oral Daily  . simvastatin  40 mg Oral QHS   . heparin 1,650 Units/hr (03/24/16 1400)  . sodium chloride 0.9 % 1,000 mL with potassium chloride 20 mEq infusion      OBJECTIVE: Physical Exam: Vitals:   03/24/16 1300 03/24/16 1331 03/24/16 1400 03/24/16 1425  BP:    100/76  Pulse: 78 60 86 67  Resp: 18 18 (!) 24 17  Temp:    97.7 F (36.5 C)  TempSrc:    Oral  SpO2: 96% 95% 99% 96%  Weight:      Height:        Intake/Output Summary (Last 24 hours) at 03/24/16 1510 Last data filed at 03/24/16 1412  Gross per 24 hour  Intake          1823.45 ml  Output             1575 ml  Net           248.45 ml    Telemetry reveals rate controlled afib  GEN- The patient is well appearing, alert and oriented x 3 today.   Head- normocephalic, atraumatic Eyes-  Sclera clear, conjunctiva pink Ears- hearing intact Oropharynx- clear Neck- supple,   Lungs- Clear to ausculation bilaterally, normal work of breathing Heart- irregular rate and rhythm, no murmurs, rubs or gallops, PMI not laterally displaced GI- soft, NT, ND, + BS Extremities- no clubbing, cyanosis, or edema Skin- no rash or lesion Psych- euthymic mood, full affect Neuro- strength and sensation are intact  LABS: Basic Metabolic Panel:  Recent Labs  03/22/16 2253 03/24/16 0358  NA 135 132*  K 3.8 4.1  CL 100* 99*  CO2 23 25  GLUCOSE 155* 106*  BUN 8 6  CREATININE 0.92 0.75  CALCIUM 9.7 9.1  MG  --  1.5*   Liver Function Tests:  Recent Labs  03/22/16 2253  AST 37  ALT 29  ALKPHOS 63  BILITOT 1.1  PROT 7.2  ALBUMIN 3.5   No results for input(s): LIPASE, AMYLASE in the last 72 hours. CBC:  Recent Labs  03/22/16 2253  03/24/16 0358  WBC 9.9 7.1  NEUTROABS 6.7  --   HGB 13.0 11.6*  HCT 37.8* 34.7*  MCV 99.5 100.6*  PLT 322 303   RADIOLOGY: Dg Chest 2 View  Result Date: 03/22/2016 CLINICAL DATA:  Shortness of breath. EXAM: CHEST  2 VIEW COMPARISON:  Radiographs of April 16, 2015. FINDINGS: The heart size and mediastinal contours are within normal limits. No pneumothorax is noted. Left lung is clear. Mild right basilar subsegmental atelectasis or infiltrate is noted with minimal right pleural effusion. The visualized skeletal structures are unremarkable. IMPRESSION: Mild right basilar subsegmental atelectasis or infiltrate with associated minimal right pleural effusion. Electronically Signed   By: Marijo Conception, M.D.   On: 03/22/2016 21:13   Ct Angio Chest Pe W And/or Wo Contrast  Result Date: 03/23/2016 CLINICAL DATA:  Shortness of breath and pleuritic chest pain. Recent knee surgery. EXAM: CT ANGIOGRAPHY CHEST WITH CONTRAST TECHNIQUE: Multidetector CT imaging of the chest was performed using the standard protocol during bolus administration of intravenous contrast. Multiplanar  CT image reconstructions and MIPs were obtained to evaluate the vascular anatomy. CONTRAST:  100 cc Isovue 370 IV COMPARISON:  Chest CT 11/25/2015 FINDINGS: Cardiovascular: Examination is positive for bilateral pulmonary emboli. On the left this involves the distal lobar and segmental branches to the left lower lobe and lingula. On the right this involves the segmental branches of the lower lobe extending into the subsegmental branches distally. There is evidence of right heart strain with RV to LV ratio of 1.28. Trace contrast refluxes into the IVC There is mild multi chamber cardiomegaly. Thoracic aortic atherosclerosis without evidence of dissection. Coronary artery calcifications. Mediastinum/Nodes: No pericardial effusion. No mediastinal or hilar adenopathy. Mild distal esophageal thickening is again seen. Lungs/Pleura: Lower lobe  atelectasis, right greater than left. No definite pulmonary infarct. There is a small dependent right pleural effusion. Central airway thickening is seen. Motion artifact partially obscures lower lobe evaluation. Upper Abdomen: Atherosclerosis. No acute abnormality. 11 mm splenic artery aneurysm is unchanged. Musculoskeletal: There are no acute or suspicious osseous abnormalities. Review of the MIP images confirms the above findings. IMPRESSION: Positive for bilateral segmental pulmonary emboli, greatest clot burden in the right lower lobe. Positive for CT evidence of right heart strain (RV/LV Ratio = 1.28) consistent with at least submassive (intermediate risk) PE. The presence of right heart strain has been associated with an increased risk of morbidity and mortality. Please activate Code PE by paging [phone]. Critical Value/emergent results were called by telephone at the time of interpretation on 03/23/2016 at 12:46 am to Dr. Zenovia Jarred , who verbally acknowledged these results. Electronically Signed   By: Jeb Levering M.D.   On: 03/23/2016 00:46   Echo is reviewed-  EF normal,  Mildly reduced RV function and mildly increased PA pressures,   ASSESSMENT AND PLAN:   1. PTE Acute and extensive PTE Some R heart strain noted on CTA however echo is less impressive Clinically looks stable Continue IV heparin for another day If remains stable, would convert to either Xarelto or eliquis at therapeutic PTE dosing tomorrow  2. Permanent afib Stable Rate controlled  Appreciate great care by hospitalists team I will follow from afar Please call with questions  Thompson Grayer, MD 03/24/2016 3:10 PM

## 2016-03-24 NOTE — Progress Notes (Signed)
ANTICOAGULATION CONSULT NOTE - Follow Up Consult  Pharmacy Consult for Hepain Indication: pulmonary embolus  No Known Allergies  Patient Measurements: Height: 6\' 2"  (188 cm) Weight: 219 lb 9.3 oz (99.6 kg) IBW/kg (Calculated) : 82.2 Heparin Dosing Weight:  95.8 kg  Vital Signs: Temp: 98.2 F (36.8 C) (12/07 1100) Temp Source: Oral (12/07 1100) BP: 104/69 (12/07 1100) Pulse Rate: 80 (12/07 1200)  Labs:  Recent Labs  03/22/16 2253 03/23/16 0911 03/23/16 1521 03/24/16 0358  HGB 13.0  --   --  11.6*  HCT 37.8*  --   --  34.7*  PLT 322  --   --  303  HEPARINUNFRC  --  0.47 0.32 0.44  CREATININE 0.92  --   --  0.75    Estimated Creatinine Clearance: 108.4 mL/min (by C-G formula based on SCr of 0.75 mg/dL).  Assessment:  Anticoag: New PE s/p R TKA. HL 0.44. Hgb 13>11.6  Goal of Therapy:  Heparin level 0.3-0.7 units/ml Monitor platelets by anticoagulation protocol: Yes   Plan:  Continue heparin drip at 1650 units/hr Daily CBC/HL  Zachary Sanchez S. Alford Highland, PharmD, BCPS Clinical Staff Pharmacist Pager [phone]  Zachary Sanchez 03/24/2016,12:37 PM

## 2016-03-24 NOTE — Progress Notes (Addendum)
PROGRESS NOTE  Zachary Sanchez [id number] DOB: [birth year] DOA: 03/22/2016 PCP: Marylene Land, MD  Brief History:  70 year old male with a history of persistent atrial fibrillation, prostate cancer status post radical prostatectomy 07/09/2015 presented with acute onset of right sided chest pain that began on the evening of 03/22/2016. The patient had associated shortness of breath. The patient recently underwent a right total knee arthroplasty on 03/04/2016 performed by Dr. Sydnee Cabal. He was sent home with aspirin 325 mg twice a day for DVT prophylaxis. The patient stated that on approximately two thirds of the days prior to this admission, he only took two ASA 81 mg tabs daily.  On the other one third of the days, the patient stated he took aspirin 325 mg once daily. Patient took his oral Dilaudid with some relief initially, but when his shortness of breath and chest pain returned, EMS was activated. CT angiogram of the chest in the emergency department revealed bilateral segmental pulmonary emboli raises burden in the RLL with evidence of RV strain. The patient was admitted and started on intravenous heparin.  Assessment/Plan: Acute submassive pulmonary embolus -Appears to be provoked by clinical history -Continue intravenous heparin -Echocardiogram--EF 55-65%, mild decreased RV function, PASP 39, no WMA -Venous duplex lower extremities--neg -Presently hemodynamically stable with oxygen saturation 96-97 percent on room air  Permanent atrial fibrillation -CHADS-VASc = 1 -previously on ASA 81 mg daily -appreciate cardiology consult -Rate controlled  Hyperlipidemia -Continue simvastatin  S/P Right total knee arthroplasty -Dr. Sydnee Cabal 03/04/16 -resume PT if stable in 24 hours  Hyponatremia -likely volume depletion vs. Poor solute intake -start IV NS with 20 mEQ KCl -am BMP -urine osm, serum osm  Hyperglycemia -A1C--pending  Low grade fever -likely  due to acute VTE -UA--neg for pyuria    Disposition Plan:   Home 12/9 if stable Family Communication:   wife updated at bedside 03/25/16--Total time spent 35 minutes.  Greater than 50% spent face to face counseling and coordinating care.   Consultants:  Cardiology  Code Status:  FULL  DVT Prophylaxis:  IV heparin    Procedures: As Listed in Progress Note Above  Antibiotics: None    Subjective: Patient is feeling better today. He still has some pleuritic right-sided chest pain but it is better than yesterday. It is a sharp pain in the right rib area worsening with deep inspiration. Denies any headache, neck pain, nausea, vomiting, diarrhea, abdominal pain. No dysuria or hematuria. No rashes.  Objective: Vitals:   03/24/16 1300 03/24/16 1331 03/24/16 1400 03/24/16 1425  BP:    100/76  Pulse: 78 60 86 67  Resp: 18 18 (!) 24 17  Temp:    97.7 F (36.5 C)  TempSrc:    Oral  SpO2: 96% 95% 99% 96%  Weight:      Height:        Intake/Output Summary (Last 24 hours) at 03/24/16 1639 Last data filed at 03/24/16 1412  Gross per 24 hour  Intake          1823.45 ml  Output             1475 ml  Net           348.45 ml   Weight change:  Exam:   General:  Pt is alert, follows commands appropriately, not in acute distress  HEENT: No icterus, No thrush, No neck mass, [place]/AT  Cardiovascular: RRR, S1/S2, no rubs, no gallops  Respiratory: Bibasilar rales. No wheezing. Good air movement.  Abdomen: Soft/+BS, non tender, non distended, no guarding  Extremities: 1 + RLE edema, No lymphangitis, No petechiae, No rashes, no synovitis   Data Reviewed: I have personally reviewed following labs and imaging studies Basic Metabolic Panel:  Recent Labs Lab 03/22/16 2253 03/24/16 0358  NA 135 132*  K 3.8 4.1  CL 100* 99*  CO2 23 25  GLUCOSE 155* 106*  BUN 8 6  CREATININE 0.92 0.75  CALCIUM 9.7 9.1  MG  --  1.5*   Liver Function Tests:  Recent Labs Lab  03/22/16 2253  AST 37  ALT 29  ALKPHOS 63  BILITOT 1.1  PROT 7.2  ALBUMIN 3.5   No results for input(s): LIPASE, AMYLASE in the last 168 hours. No results for input(s): AMMONIA in the last 168 hours. Coagulation Profile: No results for input(s): INR, PROTIME in the last 168 hours. CBC:  Recent Labs Lab 03/22/16 2253 03/24/16 0358  WBC 9.9 7.1  NEUTROABS 6.7  --   HGB 13.0 11.6*  HCT 37.8* 34.7*  MCV 99.5 100.6*  PLT 322 303   Cardiac Enzymes: No results for input(s): CKTOTAL, CKMB, CKMBINDEX, TROPONINI in the last 168 hours. BNP: Invalid input(s): POCBNP CBG: No results for input(s): GLUCAP in the last 168 hours. HbA1C: No results for input(s): HGBA1C in the last 72 hours. Urine analysis:    Component Value Date/Time   COLORURINE YELLOW 03/23/2016 1004   APPEARANCEUR CLEAR 03/23/2016 1004   LABSPEC 1.039 (H) 03/23/2016 1004   PHURINE 5.0 03/23/2016 1004   GLUCOSEU NEGATIVE 03/23/2016 1004   HGBUR NEGATIVE 03/23/2016 1004   BILIRUBINUR NEGATIVE 03/23/2016 1004   KETONESUR NEGATIVE 03/23/2016 1004   PROTEINUR NEGATIVE 03/23/2016 1004   NITRITE NEGATIVE 03/23/2016 1004   LEUKOCYTESUR NEGATIVE 03/23/2016 1004   Sepsis Labs: @LABRCNTIP (procalcitonin:4,lacticidven:4) ) Recent Results (from the past 240 hour(s))  MRSA PCR Screening     Status: None   Collection Time: 03/23/16  6:38 AM  Result Value Ref Range Status   MRSA by PCR NEGATIVE NEGATIVE Final    Comment:        The GeneXpert MRSA Assay (FDA approved for NASAL specimens only), is one component of a comprehensive MRSA colonization surveillance program. It is not intended to diagnose MRSA infection nor to guide or monitor treatment for MRSA infections.   Urine culture     Status: None (Preliminary result)   Collection Time: 03/23/16 10:04 AM  Result Value Ref Range Status   Specimen Description URINE, RANDOM  Final   Special Requests NONE  Final   Culture CULTURE REINCUBATED FOR BETTER GROWTH   Final   Report Status PENDING  Incomplete     Scheduled Meds: . allopurinol  300 mg Oral Daily  . pantoprazole  80 mg Oral Daily  . polyethylene glycol  17 g Oral Daily  . simvastatin  40 mg Oral QHS   Continuous Infusions: . heparin 1,650 Units/hr (03/24/16 1400)  . sodium chloride 0.9 % 1,000 mL with potassium chloride 20 mEq infusion 75 mL/hr at 03/24/16 1529    Procedures/Studies: Dg Chest 2 View  Result Date: 03/22/2016 CLINICAL DATA:  Shortness of breath. EXAM: CHEST  2 VIEW COMPARISON:  Radiographs of April 16, 2015. FINDINGS: The heart size and mediastinal contours are within normal limits. No pneumothorax is noted. Left lung is clear. Mild right basilar subsegmental atelectasis or infiltrate is noted with minimal right pleural effusion. The visualized skeletal structures are unremarkable. IMPRESSION:  Mild right basilar subsegmental atelectasis or infiltrate with associated minimal right pleural effusion. Electronically Signed   By: Marijo Conception, M.D.   On: 03/22/2016 21:13   Ct Angio Chest Pe W And/or Wo Contrast  Result Date: 03/23/2016 CLINICAL DATA:  Shortness of breath and pleuritic chest pain. Recent knee surgery. EXAM: CT ANGIOGRAPHY CHEST WITH CONTRAST TECHNIQUE: Multidetector CT imaging of the chest was performed using the standard protocol during bolus administration of intravenous contrast. Multiplanar CT image reconstructions and MIPs were obtained to evaluate the vascular anatomy. CONTRAST:  100 cc Isovue 370 IV COMPARISON:  Chest CT 11/25/2015 FINDINGS: Cardiovascular: Examination is positive for bilateral pulmonary emboli. On the left this involves the distal lobar and segmental branches to the left lower lobe and lingula. On the right this involves the segmental branches of the lower lobe extending into the subsegmental branches distally. There is evidence of right heart strain with RV to LV ratio of 1.28. Trace contrast refluxes into the IVC There is mild multi  chamber cardiomegaly. Thoracic aortic atherosclerosis without evidence of dissection. Coronary artery calcifications. Mediastinum/Nodes: No pericardial effusion. No mediastinal or hilar adenopathy. Mild distal esophageal thickening is again seen. Lungs/Pleura: Lower lobe atelectasis, right greater than left. No definite pulmonary infarct. There is a small dependent right pleural effusion. Central airway thickening is seen. Motion artifact partially obscures lower lobe evaluation. Upper Abdomen: Atherosclerosis. No acute abnormality. 11 mm splenic artery aneurysm is unchanged. Musculoskeletal: There are no acute or suspicious osseous abnormalities. Review of the MIP images confirms the above findings. IMPRESSION: Positive for bilateral segmental pulmonary emboli, greatest clot burden in the right lower lobe. Positive for CT evidence of right heart strain (RV/LV Ratio = 1.28) consistent with at least submassive (intermediate risk) PE. The presence of right heart strain has been associated with an increased risk of morbidity and mortality. Please activate Code PE by paging [phone]. Critical Value/emergent results were called by telephone at the time of interpretation on 03/23/2016 at 12:46 am to Dr. Zenovia Jarred , who verbally acknowledged these results. Electronically Signed   By: Jeb Levering M.D.   On: 03/23/2016 00:46    Areatha Kalata, DO  [facility] Pager [phone]  If 7PM-7AM, please contact night-coverage [email or web address] Password [email or web address] 03/24/2016, 4:39 PM   LOS: 1 day

## 2016-03-24 NOTE — Care Management Note (Signed)
Case Management Note  Patient Details  Name: Zachary Sanchez MRN: [id number] Date of Birth: 1945-11-08  Subjective/Objective:  Present with PE, lives with spouse, NCM gave patient xarelto 30 day savings card, his co pay will be 37.00.  Patient preferred xarelto over eliquis because co pay was cheaper.  NCM will cont to follow for dc needs.                  Action/Plan:   Expected Discharge Date:  03/25/16               Expected Discharge Plan:  Home/Self Care  In-House Referral:     Discharge planning Services  CM Consult  Post Acute Care Choice:    Choice offered to:     DME Arranged:    DME Agency:     HH Arranged:    HH Agency:     Status of Service:  In process, will continue to follow  If discussed at Long Length of Stay Meetings, dates discussed:    Additional Comments:  Zenon Mayo, RN 03/24/2016, 4:56 PM

## 2016-03-25 LAB — BASIC METABOLIC PANEL
Anion gap: 9 (ref 5–15)
BUN: 5 mg/dL — AB (ref 6–20)
CALCIUM: 9.1 mg/dL (ref 8.9–10.3)
CHLORIDE: 99 mmol/L — AB (ref 101–111)
CO2: 26 mmol/L (ref 22–32)
CREATININE: 0.77 mg/dL (ref 0.61–1.24)
GFR calc non Af Amer: >60 mL/min (ref >60)
Glucose, Bld: 104 mg/dL — ABNORMAL HIGH (ref 65–99)
Potassium: 4.1 mmol/L (ref 3.5–5.1)
SODIUM: 134 mmol/L — AB (ref 135–145)

## 2016-03-25 LAB — CBC
HEMATOCRIT: 33.9 % — AB (ref 39.0–52.0)
Hemoglobin: 11.5 g/dL — ABNORMAL LOW (ref 13.0–17.0)
MCH: 33.7 pg (ref 26.0–34.0)
MCHC: 33.9 g/dL (ref 30.0–36.0)
MCV: 99.4 fL (ref 78.0–100.0)
PLATELETS: 311 10*3/uL (ref 150–400)
RBC: 3.41 MIL/uL — ABNORMAL LOW (ref 4.22–5.81)
RDW: 12.9 % (ref 11.5–15.5)
WBC: 5.3 10*3/uL (ref 4.0–10.5)

## 2016-03-25 LAB — URINE CULTURE: Culture: 40000 — AB

## 2016-03-25 LAB — MAGNESIUM: Magnesium: 1.8 mg/dL (ref 1.7–2.4)

## 2016-03-25 LAB — HEPARIN LEVEL (UNFRACTIONATED): HEPARIN UNFRACTIONATED: 0.43 [IU]/mL (ref 0.30–0.70)

## 2016-03-25 MED ORDER — SODIUM CHLORIDE 0.9 % IV SOLN
INTRAVENOUS | Status: AC
  Filled 2016-03-25: qty 1000

## 2016-03-25 MED ORDER — APIXABAN 5 MG PO TABS: 5.0000 mg | ORAL_TABLET | Freq: Two times a day (BID) | ORAL | Status: DC

## 2016-03-25 MED ORDER — DOCUSATE SODIUM 100 MG PO CAPS
100.0000 mg | ORAL_CAPSULE | Freq: Two times a day (BID) | ORAL | Status: DC
  Administered 2016-03-25 – 2016-03-26 (×3): 100 mg via ORAL
  Filled 2016-03-25 (×4): qty 1

## 2016-03-25 MED ORDER — APIXABAN 5 MG PO TABS
10.0000 mg | ORAL_TABLET | Freq: Two times a day (BID) | ORAL | Status: DC
Start: 2016-03-25 — End: 2016-03-26
  Administered 2016-03-25 – 2016-03-26 (×3): 10 mg via ORAL
  Filled 2016-03-25 (×3): qty 2

## 2016-03-25 NOTE — Care Management Note (Signed)
Case Management Note Marvetta Gibbons RN, BSN Unit 2W-Case Manager [phone]  Patient Details  Name: Zachary Sanchez MRN: [id number] Date of Birth: 03-Apr-1946  Subjective/Objective:   Pt admitted with PE                 Action/Plan: PTA pt lived at home- was active with [facility] for HHPT- will need resumption order for discharge- pt started on Eliquis- copay $47/mo- 30 day free card given to pt to use on discharge.   Expected Discharge Date:               Expected Discharge Plan:  Home/Self Care  In-House Referral:     Discharge planning Services  CM Consult, Medication Assistance  Post Acute Care Choice:  Home Health, Resumption of Svcs/PTA Provider Choice offered to:  Patient  DME Arranged:    DME Agency:     HH Arranged:  PT [place]:  [facility] (formerly [employer])  Status of Service:  In process, will continue to follow  If discussed at Long Length of Stay Meetings, dates discussed:    Additional Comments:  Dawayne Patricia, RN 03/25/2016, 3:36 PM

## 2016-03-25 NOTE — Progress Notes (Signed)
PROGRESS NOTE  Zachary Sanchez [id number] DOB: [birth year] DOA: 03/22/2016 PCP: Marylene Land, MD  Brief History: 70 year old male with a history of persistent atrial fibrillation, prostate cancer status post radical prostatectomy 07/09/2015 presented with acute onset of right sided chest pain that began on the evening of 03/22/2016. The patient had associated shortness of breath. The patient recently underwent a right total knee arthroplasty on 03/04/2016 performed by Dr. Sydnee Cabal. He was sent home with aspirin 325 mg twice a day for DVT prophylaxis. The patient stated that on approximately two thirds of the days prior to this admission, he only took two ASA 81 mg tabs daily. On the other one third of the days, the patient stated he took aspirin 325 mg once daily. Patient took his oral Dilaudid with some relief initially, but when his shortness of breath and chest pain returned, EMS was activated. CT angiogram of the chest in the emergency department revealed bilateral segmental pulmonary emboli raises burden in the RLLwith evidence of RV strain. The patient was admitted and started on intravenous heparin.  Assessment/Plan: Acute submassive pulmonary embolus -Appears to be provoked by clinical history -Initially on intravenous heparin x 72 hours -12/8--switch to po apixaban -Echocardiogram--EF 55-65%, mild decreased RV function, PASP 39, no WMA -Venous duplex lower extremities--neg -Presently hemodynamically stable with oxygen saturation 97-98 percent on room air  Permanentatrial fibrillation -CHADS-VASc = 1 -previously on ASA 81 mg daily -appreciate cardiology consult -Rate controlled  Hyperlipidemia -Continue simvastatin  S/P Right total knee arthroplasty -Dr. Sydnee Cabal 03/04/16 -resume PT if stable in 24 hours  Hyponatremia -likely volume depletion vs. Poor solute intake -continue IV NS with 20 mEQ KCl -am BMP-->improving -urine osm, serum  osm  Hyperglycemia -A1C--4.9 -likely stress induced  Low grade fever -likely due to acute VTE -UA--neg for pyuria    Disposition Plan: Home 12/9 if stable Family Communication: wife updated at bedside 03/25/16  Consultants: Cardiology  Code Status: FULL  DVT Prophylaxis: IV heparin   Procedures: As Listed in Progress Note Above  Antibiotics: None   Subjective: Patient still has some mild dyspnea on exertion but much improved. Right-sided pleuritic pain is improving. Denies any nausea, vomiting, diarrhea, abdominal pain, dizziness. No fevers or chills.  Objective: Vitals:   03/24/16 1425 03/24/16 1934 03/25/16 0523 03/25/16 1444  BP: 100/76 102/64 112/76 107/79  Pulse: 67 72 74 68  Resp: 17 18 18 18   Temp: 97.7 F (36.5 C) 97.7 F (36.5 C) 97.7 F (36.5 C) 98.7 F (37.1 C)  TempSrc: Oral Oral Oral Oral  SpO2: 96% 96% 95% 98%  Weight:  94.4 kg (208 lb 3.2 oz)    Height:        Intake/Output Summary (Last 24 hours) at 03/25/16 1729 Last data filed at 03/25/16 0840  Gross per 24 hour  Intake           216.62 ml  Output              400 ml  Net          -183.38 ml   Weight change:  Exam:   General:  Pt is alert, follows commands appropriately, not in acute distress  HEENT: No icterus, No thrush, No neck mass, [place]/AT  Cardiovascular: IRRR, S1/S2, no rubs, no gallops  Respiratory: Fine bibasilar crackles. No wheezing. Good air movement.  Abdomen: Soft/+BS, non tender, non distended, no guarding  Extremities: No edema, No lymphangitis, No petechiae,  No rashes, no synovitis   Data Reviewed: I have personally reviewed following labs and imaging studies Basic Metabolic Panel:  Recent Labs Lab 03/22/16 2253 03/24/16 0358 03/25/16 0223  NA 135 132* 134*  K 3.8 4.1 4.1  CL 100* 99* 99*  CO2 23 25 26   GLUCOSE 155* 106* 104*  BUN 8 6 5*  CREATININE 0.92 0.75 0.77  CALCIUM 9.7 9.1 9.1  MG  --  1.5* 1.8   Liver Function  Tests:  Recent Labs Lab 03/22/16 2253  AST 37  ALT 29  ALKPHOS 63  BILITOT 1.1  PROT 7.2  ALBUMIN 3.5   No results for input(s): LIPASE, AMYLASE in the last 168 hours. No results for input(s): AMMONIA in the last 168 hours. Coagulation Profile: No results for input(s): INR, PROTIME in the last 168 hours. CBC:  Recent Labs Lab 03/22/16 2253 03/24/16 0358 03/25/16 0223  WBC 9.9 7.1 5.3  NEUTROABS 6.7  --   --   HGB 13.0 11.6* 11.5*  HCT 37.8* 34.7* 33.9*  MCV 99.5 100.6* 99.4  PLT 322 303 311   Cardiac Enzymes: No results for input(s): CKTOTAL, CKMB, CKMBINDEX, TROPONINI in the last 168 hours. BNP: Invalid input(s): POCBNP CBG: No results for input(s): GLUCAP in the last 168 hours. HbA1C:  Recent Labs  03/24/16 0358  HGBA1C 4.9   Urine analysis:    Component Value Date/Time   COLORURINE YELLOW 03/23/2016 1004   APPEARANCEUR CLEAR 03/23/2016 1004   LABSPEC 1.039 (H) 03/23/2016 1004   PHURINE 5.0 03/23/2016 1004   GLUCOSEU NEGATIVE 03/23/2016 1004   HGBUR NEGATIVE 03/23/2016 1004   BILIRUBINUR NEGATIVE 03/23/2016 1004   KETONESUR NEGATIVE 03/23/2016 1004   PROTEINUR NEGATIVE 03/23/2016 1004   NITRITE NEGATIVE 03/23/2016 1004   LEUKOCYTESUR NEGATIVE 03/23/2016 1004   Sepsis Labs: @LABRCNTIP (procalcitonin:4,lacticidven:4) ) Recent Results (from the past 240 hour(s))  MRSA PCR Screening     Status: None   Collection Time: 03/23/16  6:38 AM  Result Value Ref Range Status   MRSA by PCR NEGATIVE NEGATIVE Final    Comment:        The GeneXpert MRSA Assay (FDA approved for NASAL specimens only), is one component of a comprehensive MRSA colonization surveillance program. It is not intended to diagnose MRSA infection nor to guide or monitor treatment for MRSA infections.   Urine culture     Status: Abnormal   Collection Time: 03/23/16 10:04 AM  Result Value Ref Range Status   Specimen Description URINE, RANDOM  Final   Special Requests NONE  Final    Culture (A)  Final    40,000 COLONIES/mL STAPHYLOCOCCUS SPECIES (COAGULASE NEGATIVE)   Report Status 03/25/2016 FINAL  Final   Organism ID, Bacteria STAPHYLOCOCCUS SPECIES (COAGULASE NEGATIVE) (A)  Final      Susceptibility   Staphylococcus species (coagulase negative) - MIC*    CIPROFLOXACIN <=0.5 SENSITIVE Sensitive     GENTAMICIN <=0.5 SENSITIVE Sensitive     NITROFURANTOIN <=16 SENSITIVE Sensitive     OXACILLIN >=4 RESISTANT Resistant     TETRACYCLINE >=16 RESISTANT Resistant     VANCOMYCIN 2 SENSITIVE Sensitive     TRIMETH/SULFA <=10 SENSITIVE Sensitive     CLINDAMYCIN <=0.25 SENSITIVE Sensitive     RIFAMPIN <=0.5 SENSITIVE Sensitive     Inducible Clindamycin NEGATIVE Sensitive     * 40,000 COLONIES/mL STAPHYLOCOCCUS SPECIES (COAGULASE NEGATIVE)     Scheduled Meds: . allopurinol  300 mg Oral Daily  . apixaban  10 mg Oral BID  . [  START ON 04/01/2016] apixaban  5 mg Oral BID  . docusate sodium  100 mg Oral BID  . pantoprazole  80 mg Oral Daily  . polyethylene glycol  17 g Oral Daily  . simvastatin  40 mg Oral QHS   Continuous Infusions:  Procedures/Studies: Dg Chest 2 View  Result Date: 03/22/2016 CLINICAL DATA:  Shortness of breath. EXAM: CHEST  2 VIEW COMPARISON:  Radiographs of April 16, 2015. FINDINGS: The heart size and mediastinal contours are within normal limits. No pneumothorax is noted. Left lung is clear. Mild right basilar subsegmental atelectasis or infiltrate is noted with minimal right pleural effusion. The visualized skeletal structures are unremarkable. IMPRESSION: Mild right basilar subsegmental atelectasis or infiltrate with associated minimal right pleural effusion. Electronically Signed   By: Marijo Conception, M.D.   On: 03/22/2016 21:13   Ct Angio Chest Pe W And/or Wo Contrast  Result Date: 03/23/2016 CLINICAL DATA:  Shortness of breath and pleuritic chest pain. Recent knee surgery. EXAM: CT ANGIOGRAPHY CHEST WITH CONTRAST TECHNIQUE: Multidetector CT  imaging of the chest was performed using the standard protocol during bolus administration of intravenous contrast. Multiplanar CT image reconstructions and MIPs were obtained to evaluate the vascular anatomy. CONTRAST:  100 cc Isovue 370 IV COMPARISON:  Chest CT 11/25/2015 FINDINGS: Cardiovascular: Examination is positive for bilateral pulmonary emboli. On the left this involves the distal lobar and segmental branches to the left lower lobe and lingula. On the right this involves the segmental branches of the lower lobe extending into the subsegmental branches distally. There is evidence of right heart strain with RV to LV ratio of 1.28. Trace contrast refluxes into the IVC There is mild multi chamber cardiomegaly. Thoracic aortic atherosclerosis without evidence of dissection. Coronary artery calcifications. Mediastinum/Nodes: No pericardial effusion. No mediastinal or hilar adenopathy. Mild distal esophageal thickening is again seen. Lungs/Pleura: Lower lobe atelectasis, right greater than left. No definite pulmonary infarct. There is a small dependent right pleural effusion. Central airway thickening is seen. Motion artifact partially obscures lower lobe evaluation. Upper Abdomen: Atherosclerosis. No acute abnormality. 11 mm splenic artery aneurysm is unchanged. Musculoskeletal: There are no acute or suspicious osseous abnormalities. Review of the MIP images confirms the above findings. IMPRESSION: Positive for bilateral segmental pulmonary emboli, greatest clot burden in the right lower lobe. Positive for CT evidence of right heart strain (RV/LV Ratio = 1.28) consistent with at least submassive (intermediate risk) PE. The presence of right heart strain has been associated with an increased risk of morbidity and mortality. Please activate Code PE by paging [phone]. Critical Value/emergent results were called by telephone at the time of interpretation on 03/23/2016 at 12:46 am to Dr. Zenovia Jarred , who  verbally acknowledged these results. Electronically Signed   By: Jeb Levering M.D.   On: 03/23/2016 00:46    Allysa Governale, DO  [facility] Pager [phone]  If 7PM-7AM, please contact night-coverage [email or web address] Password [email or web address] 03/25/2016, 5:29 PM   LOS: 2 days

## 2016-03-25 NOTE — Progress Notes (Signed)
Central telemetry called with a 2.32 pause on this patient. Patient asymptomatic. Will continue to monitor

## 2016-03-25 NOTE — Progress Notes (Addendum)
ANTICOAGULATION CONSULT NOTE - Follow Up Consult  Pharmacy Consult for Heparin --> Apixaban Indication: pulmonary embolus  No Known Allergies  Patient Measurements: Height: 6\' 2"  (188 cm) Weight: 208 lb 3.2 oz (94.4 kg) IBW/kg (Calculated) : 82.2   Vital Signs: Temp: 97.7 F (36.5 C) (12/08 0523) Temp Source: Oral (12/08 0523) BP: 112/76 (12/08 0523) Pulse Rate: 74 (12/08 0523)  Labs:  Recent Labs  03/22/16 2253  03/23/16 1521 03/24/16 0358 03/25/16 0223  HGB 13.0  --   --  11.6* 11.5*  HCT 37.8*  --   --  34.7* 33.9*  PLT 322  --   --  303 311  HEPARINUNFRC  --   < > 0.32 0.44 0.43  CREATININE 0.92  --   --  0.75 0.77  < > = values in this interval not displayed.  Estimated Creatinine Clearance: 99.9 mL/min (by C-G formula based on SCr of 0.77 mg/dL).   Medications:  Heparin @ 1650 units/hr  Assessment: 70yom continues on heparin for new PE (per CT angio 12/6) s/p right TKA several weeks ago. Heparin level is therapeutic at 0.43. CBC stable. No bleeding.  Goal of Therapy:  Heparin level 0.3-0.7 units/ml Monitor platelets by anticoagulation protocol: Yes   Plan:  1) Continue heparin at 1650 units/hr 2) Daily heparin level and CBC  Deboraha Sprang 03/25/2016,9:14 AM    Addendum: Pharmacy now asked to switch to apixaban. Will dose apixaban 10mg  bid x 7 days then 5mg  bid. Case management aware. Will provide education.  Deboraha Sprang 03/25/2016, 1:40 PM

## 2016-03-25 NOTE — Discharge Instructions (Signed)
Information on my medicine - ELIQUIS (apixaban)  This medication education was reviewed with me or my healthcare representative as part of my discharge preparation.  The pharmacist that spoke with me during my hospital stay was:  Deboraha Sprang, [facility]  Why was Eliquis prescribed for you? Eliquis was prescribed to treat blood clots that may have been found in the veins of your legs (deep vein thrombosis) or in your lungs (pulmonary embolism) and to reduce the risk of them occurring again.  What do You need to know about Eliquis ? The starting dose is 10 mg (two 5 mg tablets) taken TWICE daily for the FIRST SEVEN (7) DAYS, then on 04/01/16    the dose is reduced to ONE 5 mg tablet taken TWICE daily.  Eliquis may be taken with or without food.   Try to take the dose about the same time in the morning and in the evening. If you have difficulty swallowing the tablet whole please discuss with your pharmacist how to take the medication safely.  Take Eliquis exactly as prescribed and DO NOT stop taking Eliquis without talking to the doctor who prescribed the medication.  Stopping may increase your risk of developing a new blood clot.  Refill your prescription before you run out.  After discharge, you should have regular check-up appointments with your healthcare provider that is prescribing your Eliquis.    What do you do if you miss a dose? If a dose of ELIQUIS is not taken at the scheduled time, take it as soon as possible on the same day and twice-daily administration should be resumed. The dose should not be doubled to make up for a missed dose.  Important Safety Information A possible side effect of Eliquis is bleeding. You should call your healthcare provider right away if you experience any of the following: ? Bleeding from an injury or your nose that does not stop. ? Unusual colored urine (red or dark brown) or unusual colored stools (red or black). ? Unusual bruising for  unknown reasons. ? A serious fall or if you hit your head (even if there is no bleeding).  Some medicines may interact with Eliquis and might increase your risk of bleeding or clotting while on Eliquis. To help avoid this, consult your healthcare provider or pharmacist prior to using any new prescription or non-prescription medications, including herbals, vitamins, non-steroidal anti-inflammatory drugs (NSAIDs) and supplements.  This website has more information on Eliquis (apixaban): http://[email or web address].com/eliquis/home

## 2016-03-25 NOTE — Progress Notes (Addendum)
NCM just received another update on benefit check please see info.   S/W [facility] RX # [phone] OPT- 1   1. ELIQUIS 5 MG BID 30/60 TAB  COVER- YES  CO-PAY- 47.00  TIER- 3 DRUG  PRIOR APPROVAL- NO   2 XAERLTO 15 MG BID  30 /60 TAB  COVER- YES  CO-PAY- $ 47.00  TIER- 3 DRUG  PRIOR APPROVAL- YES # [phone] OPT- 5   3 XARELTO 20 MG BID  30/60 TAB  COVER- YES  CO-PAY- $ 47.00  TIER- 3 DRUG  PRIOR APPROVAL- YES # [id number] OPT- 5

## 2016-03-26 LAB — CBC
HEMATOCRIT: 33.3 % — AB (ref 39.0–52.0)
HEMOGLOBIN: 11.4 g/dL — AB (ref 13.0–17.0)
MCH: 34 pg (ref 26.0–34.0)
MCHC: 34.2 g/dL (ref 30.0–36.0)
MCV: 99.4 fL (ref 78.0–100.0)
Platelets: 297 10*3/uL (ref 150–400)
RBC: 3.35 MIL/uL — AB (ref 4.22–5.81)
RDW: 13 % (ref 11.5–15.5)
WBC: 5.3 10*3/uL (ref 4.0–10.5)

## 2016-03-26 MED ORDER — APIXABAN 5 MG PO TABS: 10.0000 mg | ORAL_TABLET | Freq: Two times a day (BID) | ORAL | 0 refills | Status: DC

## 2016-03-26 MED ORDER — APIXABAN 5 MG PO TABS: 10.0000 mg | ORAL_TABLET | Freq: Two times a day (BID) | ORAL | 5 refills | Status: DC

## 2016-03-26 NOTE — Progress Notes (Signed)
Discussed with the patient and his wife and all questioned fully answered. He will call me if any problems arise.  IV and telemetry removed.  Pt educated on Eliquis. Spoke with Dr. Carles Collet and [facility] to make sure pt had correct prescriptions for 30-day free card.   Fritz Pickerel, RN

## 2016-03-26 NOTE — Discharge Summary (Addendum)
Physician Discharge Summary  Zachary Sanchez [id number] DOB: 03/22/1946 DOA: 03/22/2016  PCP: Marylene Land, MD  Admit date: 03/22/2016 Discharge date: 03/26/2016  Admitted From: Home Disposition:  Home  Recommendations for Outpatient Follow-up:  1. Follow up with PCP in 1-2 weeks 2. Please obtain BMP/CBC in one week   Home Health: YES Equipment/Devices:HHPT  Discharge Condition:stable CODE STATUS:FULL Diet recommendation: Heart Healthy  Brief/Interim Summary: 70 year old male with a history of persistent atrial fibrillation, prostate cancer status post radical prostatectomy 07/09/2015 presented with acute onset of right sided chest pain that began on the evening of 03/22/2016. The patient had associated shortness of breath. The patient recently underwent a right total knee arthroplasty on 03/04/2016 performed by Dr. Sydnee Cabal. He was sent home with aspirin 325 mg twice a day for DVT prophylaxis. The patient stated that on approximately two thirds of the days prior to this admission, he only took two ASA 81 mg tabs daily. On the other one third of the days, the patient stated he took aspirin 325 mg once daily. Patient took his oral Dilaudid with some relief initially, but when his shortness of breath and chest pain returned, EMS was activated. CT angiogram of the chest in the emergency department revealed bilateral segmental pulmonary emboli raises burden in the RLLwith evidence of RV strain. The patient was admitted and started on intravenous heparin.  He was subsequently transitioned to apixaban without complications.  Discharge Diagnoses:   Acute submassive pulmonary embolus -Appears to be provoked by clinical history -Initially on intravenous heparin x 72 hours -12/8--switch to po apixaban -Echocardiogram--EF 55-65%, mild decreased RV function, PASP 39, no WMA -Venous duplex lower extremities--neg -Remained  hemodynamically stable with oxygen saturation 97-98percent  on room air  Permanentatrial fibrillation -CHADS-VASc = 1 -previously on ASA 81 mg daily -appreciate cardiology consult -Rate controlled -hold ASA while on apixaban  Hyperlipidemia -Continue simvastatin  S/P Right total knee arthroplasty -Dr. Sydnee Cabal 03/04/16 -resume PT if stable in 24 hours  Hyponatremia -likely volume depletion vs. Poor solute intake -continue IV NS with 20 mEQ KCl -am BMP-->improved -FeNa <1%  Hyperglycemia -A1C--4.9 -likely stress induced  Low grade fever -likely due to acute VTE -UA--neg for pyuria   Discharge Instructions  Discharge Instructions    Diet - low sodium heart healthy    Complete by:  As directed    Increase activity slowly    Complete by:  As directed        Medication List    TAKE these medications   allopurinol 300 MG tablet Commonly known as:  ZYLOPRIM Take 300 mg by mouth daily.   apixaban 5 MG Tabs tablet Commonly known as:  ELIQUIS Take 2 tablets (10 mg total) by mouth 2 (two) times daily. Through 03/31/16.  On 04/01/16, start 1 tablet (5 mg) twice a day   cholecalciferol 1000 units tablet Commonly known as:  VITAMIN D Take 1,000 Units by mouth daily.   HYDROmorphone 2 MG tablet Commonly known as:  DILAUDID Take 1-2 tablets (2-4 mg total) by mouth every 4 (four) hours as needed for severe pain.   omeprazole 40 MG capsule Commonly known as:  PRILOSEC Take 40 mg by mouth daily.   simvastatin 40 MG tablet Commonly known as:  ZOCOR Take 40 mg by mouth at bedtime.       No Known Allergies  Consultations:  None   Procedures/Studies: Dg Chest 2 View  Result Date: 03/22/2016 CLINICAL DATA:  Shortness of breath. EXAM: CHEST  2  VIEW COMPARISON:  Radiographs of April 16, 2015. FINDINGS: The heart size and mediastinal contours are within normal limits. No pneumothorax is noted. Left lung is clear. Mild right basilar subsegmental atelectasis or infiltrate is noted with minimal right pleural  effusion. The visualized skeletal structures are unremarkable. IMPRESSION: Mild right basilar subsegmental atelectasis or infiltrate with associated minimal right pleural effusion. Electronically Signed   By: Marijo Conception, M.D.   On: 03/22/2016 21:13   Ct Angio Chest Pe W And/or Wo Contrast  Result Date: 03/23/2016 CLINICAL DATA:  Shortness of breath and pleuritic chest pain. Recent knee surgery. EXAM: CT ANGIOGRAPHY CHEST WITH CONTRAST TECHNIQUE: Multidetector CT imaging of the chest was performed using the standard protocol during bolus administration of intravenous contrast. Multiplanar CT image reconstructions and MIPs were obtained to evaluate the vascular anatomy. CONTRAST:  100 cc Isovue 370 IV COMPARISON:  Chest CT 11/25/2015 FINDINGS: Cardiovascular: Examination is positive for bilateral pulmonary emboli. On the left this involves the distal lobar and segmental branches to the left lower lobe and lingula. On the right this involves the segmental branches of the lower lobe extending into the subsegmental branches distally. There is evidence of right heart strain with RV to LV ratio of 1.28. Trace contrast refluxes into the IVC There is mild multi chamber cardiomegaly. Thoracic aortic atherosclerosis without evidence of dissection. Coronary artery calcifications. Mediastinum/Nodes: No pericardial effusion. No mediastinal or hilar adenopathy. Mild distal esophageal thickening is again seen. Lungs/Pleura: Lower lobe atelectasis, right greater than left. No definite pulmonary infarct. There is a small dependent right pleural effusion. Central airway thickening is seen. Motion artifact partially obscures lower lobe evaluation. Upper Abdomen: Atherosclerosis. No acute abnormality. 11 mm splenic artery aneurysm is unchanged. Musculoskeletal: There are no acute or suspicious osseous abnormalities. Review of the MIP images confirms the above findings. IMPRESSION: Positive for bilateral segmental pulmonary  emboli, greatest clot burden in the right lower lobe. Positive for CT evidence of right heart strain (RV/LV Ratio = 1.28) consistent with at least submassive (intermediate risk) PE. The presence of right heart strain has been associated with an increased risk of morbidity and mortality. Please activate Code PE by paging [phone]. Critical Value/emergent results were called by telephone at the time of interpretation on 03/23/2016 at 12:46 am to Dr. Zenovia Jarred , who verbally acknowledged these results. Electronically Signed   By: Jeb Levering M.D.   On: 03/23/2016 00:46        Discharge Exam: Vitals:   03/25/16 2040 03/26/16 0550  BP: 103/65 116/86  Pulse: 62 72  Resp: 18 18  Temp: 98.7 F (37.1 C) 98.5 F (36.9 C)   Vitals:   03/25/16 0523 03/25/16 1444 03/25/16 2040 03/26/16 0550  BP: 112/76 107/79 103/65 116/86  Pulse: 74 68 62 72  Resp: 18 18 18 18   Temp: 97.7 F (36.5 C) 98.7 F (37.1 C) 98.7 F (37.1 C) 98.5 F (36.9 C)  TempSrc: Oral Oral Oral Oral  SpO2: 95% 98% 95% 95%  Weight:      Height:        General: Pt is alert, awake, not in acute distress Cardiovascular: RRR, S1/S2 +, no rubs, no gallops Respiratory: fine bibasilar crackles, no wheeze Abdominal: Soft, NT, ND, bowel sounds + Extremities: no edema, no cyanosis   The results of significant diagnostics from this hospitalization (including imaging, microbiology, ancillary and laboratory) are listed below for reference.    Significant Diagnostic Studies: Dg Chest 2 View  Result Date: 03/22/2016 CLINICAL  DATA:  Shortness of breath. EXAM: CHEST  2 VIEW COMPARISON:  Radiographs of April 16, 2015. FINDINGS: The heart size and mediastinal contours are within normal limits. No pneumothorax is noted. Left lung is clear. Mild right basilar subsegmental atelectasis or infiltrate is noted with minimal right pleural effusion. The visualized skeletal structures are unremarkable. IMPRESSION: Mild right  basilar subsegmental atelectasis or infiltrate with associated minimal right pleural effusion. Electronically Signed   By: Marijo Conception, M.D.   On: 03/22/2016 21:13   Ct Angio Chest Pe W And/or Wo Contrast  Result Date: 03/23/2016 CLINICAL DATA:  Shortness of breath and pleuritic chest pain. Recent knee surgery. EXAM: CT ANGIOGRAPHY CHEST WITH CONTRAST TECHNIQUE: Multidetector CT imaging of the chest was performed using the standard protocol during bolus administration of intravenous contrast. Multiplanar CT image reconstructions and MIPs were obtained to evaluate the vascular anatomy. CONTRAST:  100 cc Isovue 370 IV COMPARISON:  Chest CT 11/25/2015 FINDINGS: Cardiovascular: Examination is positive for bilateral pulmonary emboli. On the left this involves the distal lobar and segmental branches to the left lower lobe and lingula. On the right this involves the segmental branches of the lower lobe extending into the subsegmental branches distally. There is evidence of right heart strain with RV to LV ratio of 1.28. Trace contrast refluxes into the IVC There is mild multi chamber cardiomegaly. Thoracic aortic atherosclerosis without evidence of dissection. Coronary artery calcifications. Mediastinum/Nodes: No pericardial effusion. No mediastinal or hilar adenopathy. Mild distal esophageal thickening is again seen. Lungs/Pleura: Lower lobe atelectasis, right greater than left. No definite pulmonary infarct. There is a small dependent right pleural effusion. Central airway thickening is seen. Motion artifact partially obscures lower lobe evaluation. Upper Abdomen: Atherosclerosis. No acute abnormality. 11 mm splenic artery aneurysm is unchanged. Musculoskeletal: There are no acute or suspicious osseous abnormalities. Review of the MIP images confirms the above findings. IMPRESSION: Positive for bilateral segmental pulmonary emboli, greatest clot burden in the right lower lobe. Positive for CT evidence of right  heart strain (RV/LV Ratio = 1.28) consistent with at least submassive (intermediate risk) PE. The presence of right heart strain has been associated with an increased risk of morbidity and mortality. Please activate Code PE by paging [phone]. Critical Value/emergent results were called by telephone at the time of interpretation on 03/23/2016 at 12:46 am to Dr. Zenovia Jarred , who verbally acknowledged these results. Electronically Signed   By: Jeb Levering M.D.   On: 03/23/2016 00:46     Microbiology: Recent Results (from the past 240 hour(s))  MRSA PCR Screening     Status: None   Collection Time: 03/23/16  6:38 AM  Result Value Ref Range Status   MRSA by PCR NEGATIVE NEGATIVE Final    Comment:        The GeneXpert MRSA Assay (FDA approved for NASAL specimens only), is one component of a comprehensive MRSA colonization surveillance program. It is not intended to diagnose MRSA infection nor to guide or monitor treatment for MRSA infections.   Urine culture     Status: Abnormal   Collection Time: 03/23/16 10:04 AM  Result Value Ref Range Status   Specimen Description URINE, RANDOM  Final   Special Requests NONE  Final   Culture (A)  Final    40,000 COLONIES/mL STAPHYLOCOCCUS SPECIES (COAGULASE NEGATIVE)   Report Status 03/25/2016 FINAL  Final   Organism ID, Bacteria STAPHYLOCOCCUS SPECIES (COAGULASE NEGATIVE) (A)  Final      Susceptibility   Staphylococcus species (coagulase  negative) - MIC*    CIPROFLOXACIN <=0.5 SENSITIVE Sensitive     GENTAMICIN <=0.5 SENSITIVE Sensitive     NITROFURANTOIN <=16 SENSITIVE Sensitive     OXACILLIN >=4 RESISTANT Resistant     TETRACYCLINE >=16 RESISTANT Resistant     VANCOMYCIN 2 SENSITIVE Sensitive     TRIMETH/SULFA <=10 SENSITIVE Sensitive     CLINDAMYCIN <=0.25 SENSITIVE Sensitive     RIFAMPIN <=0.5 SENSITIVE Sensitive     Inducible Clindamycin NEGATIVE Sensitive     * 40,000 COLONIES/mL STAPHYLOCOCCUS SPECIES (COAGULASE  NEGATIVE)     Labs: Basic Metabolic Panel:  Recent Labs Lab 03/22/16 2253 03/24/16 0358 03/25/16 0223  NA 135 132* 134*  K 3.8 4.1 4.1  CL 100* 99* 99*  CO2 23 25 26   GLUCOSE 155* 106* 104*  BUN 8 6 5*  CREATININE 0.92 0.75 0.77  CALCIUM 9.7 9.1 9.1  MG  --  1.5* 1.8   Liver Function Tests:  Recent Labs Lab 03/22/16 2253  AST 37  ALT 29  ALKPHOS 63  BILITOT 1.1  PROT 7.2  ALBUMIN 3.5   No results for input(s): LIPASE, AMYLASE in the last 168 hours. No results for input(s): AMMONIA in the last 168 hours. CBC:  Recent Labs Lab 03/22/16 2253 03/24/16 0358 03/25/16 0223 03/26/16 0313  WBC 9.9 7.1 5.3 5.3  NEUTROABS 6.7  --   --   --   HGB 13.0 11.6* 11.5* 11.4*  HCT 37.8* 34.7* 33.9* 33.3*  MCV 99.5 100.6* 99.4 99.4  PLT 322 303 311 297   Cardiac Enzymes: No results for input(s): CKTOTAL, CKMB, CKMBINDEX, TROPONINI in the last 168 hours. BNP: Invalid input(s): POCBNP CBG: No results for input(s): GLUCAP in the last 168 hours.  Time coordinating discharge:  Greater than 30 minutes  Signed:  Zahava Quant, DO [facility] Pager: [phone] 03/26/2016, 11:48 AM

## 2016-04-19 DIAGNOSIS — R29898 Other symptoms and signs involving the musculoskeletal system: Secondary | ICD-10-CM | POA: Diagnosis not present

## 2016-04-21 ENCOUNTER — Telehealth: Payer: Self-pay | Admitting: Internal Medicine

## 2016-04-21 ENCOUNTER — Telehealth: Payer: Self-pay | Admitting: *Deleted

## 2016-04-21 DIAGNOSIS — F329 Major depressive disorder, single episode, unspecified: Secondary | ICD-10-CM | POA: Diagnosis not present

## 2016-04-21 DIAGNOSIS — E871 Hypo-osmolality and hyponatremia: Secondary | ICD-10-CM | POA: Diagnosis not present

## 2016-04-21 DIAGNOSIS — R29898 Other symptoms and signs involving the musculoskeletal system: Secondary | ICD-10-CM | POA: Diagnosis not present

## 2016-04-21 DIAGNOSIS — D649 Anemia, unspecified: Secondary | ICD-10-CM | POA: Diagnosis not present

## 2016-04-21 DIAGNOSIS — Z86711 Personal history of pulmonary embolism: Secondary | ICD-10-CM | POA: Diagnosis not present

## 2016-04-21 MED ORDER — APIXABAN 5 MG PO TABS: 5.0000 mg | ORAL_TABLET | Freq: Two times a day (BID) | ORAL | 0 refills | Status: DC

## 2016-04-21 NOTE — Telephone Encounter (Signed)
Rx refill sent.

## 2016-04-21 NOTE — Telephone Encounter (Signed)
Nothing in the discharge summary as to how long he needs to stay Eliquis that was started for PE s/p knee surgery. He has had f/u with his PCP and the PCP stated maybe 90 days but was not sure.  He has f/u with his surgeon on Mon and will ask them at that appointment.  I let him know I would discuss with Dr Rayann Heman and see what his feeling are and when he should go back to the ASA 81mg 

## 2016-04-21 NOTE — Telephone Encounter (Signed)
Pt called to see if he needed a eph fu appt with [facility] from 03-22-16 ED visit

## 2016-04-25 ENCOUNTER — Telehealth: Payer: Self-pay | Admitting: Internal Medicine

## 2016-04-25 NOTE — Telephone Encounter (Signed)
New Message:   Judeen Hammans need the dose of Eliquis pt is supposed to be on please.

## 2016-04-25 NOTE — Telephone Encounter (Addendum)
Discussed with Dr Rayann Heman he says he will need to be on the blood thinner for at least 6 months and will see him in the office to discuss.  Zachary Sanchez is going to call him to schedule office visit with Dr Rayann Heman   I have left the patient a message with above information

## 2016-04-25 NOTE — Telephone Encounter (Signed)
Called and spoke with Judeen Hammans at Dr [facility] office and let her know his Eliquis was called in last week for 3 months and he is going to see Dr Rayann Heman soon

## 2016-04-26 DIAGNOSIS — K760 Fatty (change of) liver, not elsewhere classified: Secondary | ICD-10-CM | POA: Diagnosis not present

## 2016-04-26 DIAGNOSIS — R29898 Other symptoms and signs involving the musculoskeletal system: Secondary | ICD-10-CM | POA: Diagnosis not present

## 2016-04-28 DIAGNOSIS — R29898 Other symptoms and signs involving the musculoskeletal system: Secondary | ICD-10-CM | POA: Diagnosis not present

## 2016-05-03 DIAGNOSIS — R29898 Other symptoms and signs involving the musculoskeletal system: Secondary | ICD-10-CM | POA: Diagnosis not present

## 2016-05-04 ENCOUNTER — Ambulatory Visit: Payer: Medicare Other | Admitting: Internal Medicine

## 2016-05-05 DIAGNOSIS — R29898 Other symptoms and signs involving the musculoskeletal system: Secondary | ICD-10-CM | POA: Diagnosis not present

## 2016-05-10 DIAGNOSIS — R29898 Other symptoms and signs involving the musculoskeletal system: Secondary | ICD-10-CM | POA: Diagnosis not present

## 2016-05-12 DIAGNOSIS — R29898 Other symptoms and signs involving the musculoskeletal system: Secondary | ICD-10-CM | POA: Diagnosis not present

## 2016-05-13 ENCOUNTER — Ambulatory Visit (INDEPENDENT_AMBULATORY_CARE_PROVIDER_SITE_OTHER): Payer: PPO | Admitting: Internal Medicine

## 2016-05-13 ENCOUNTER — Encounter (INDEPENDENT_AMBULATORY_CARE_PROVIDER_SITE_OTHER): Payer: Self-pay

## 2016-05-13 ENCOUNTER — Other Ambulatory Visit: Payer: Self-pay | Admitting: *Deleted

## 2016-05-13 ENCOUNTER — Encounter: Payer: Self-pay | Admitting: Internal Medicine

## 2016-05-13 VITALS — BP 110/82 | HR 59 | Ht 74.0 in | Wt 213.0 lb

## 2016-05-13 DIAGNOSIS — I272 Pulmonary hypertension, unspecified: Secondary | ICD-10-CM

## 2016-05-13 DIAGNOSIS — I2609 Other pulmonary embolism with acute cor pulmonale: Secondary | ICD-10-CM | POA: Diagnosis not present

## 2016-05-13 DIAGNOSIS — I480 Paroxysmal atrial fibrillation: Secondary | ICD-10-CM | POA: Diagnosis not present

## 2016-05-13 MED ORDER — APIXABAN 5 MG PO TABS: 5.0000 mg | ORAL_TABLET | Freq: Two times a day (BID) | ORAL | 0 refills | Status: DC

## 2016-05-13 NOTE — Patient Instructions (Signed)
Medication Instructions:  Your physician recommends that you continue on your current medications as directed. Please refer to the Current Medication list given to you today.   Labwork: None ordered   Testing/Procedures: Your physician has requested that you have an echocardiogram. Echocardiography is a painless test that uses sound waves to create images of your heart. It provides your doctor with information about the size and shape of your heart and how well your heart's chambers and valves are working. This procedure takes approximately one hour. There are no restrictions for this procedure.    Follow-Up: Your physician wants you to follow-up in: 12 months with Dr Vallery Ridge will receive a reminder letter in the mail two months in advance. If you don't receive a letter, please call our office to schedule the follow-up appointment.   Any Other Special Instructions Will Be Listed Below (If Applicable).     If you need a refill on your cardiac medications before your next appointment, please call your pharmacy.

## 2016-05-13 NOTE — Progress Notes (Signed)
PCP: Marylene Land, MD  The patient presents today for routine cardiology followup.  He is making good recovery from knee surgical which was complicated by DVT and PTE.  He is on eliquis which he is tolerating well.  Less bleeding than he had with ASA.   Today, he denies symptoms of palpitations, chest pain, shortness of breath, orthopnea, PND, lower extremity edema, dizziness, presyncope, syncope, or neurologic sequela.  The patient feels that he is tolerating medications without difficulties and is otherwise without complaint today.   Past Medical History:  Diagnosis Date  . Aortic root enlargement (HCC)    aortic root 30mm in size  . Atrial flutter ([place])   . Biatrial enlargement   . Colonic polyp   . DJD (degenerative joint disease)   . Dysrhythmia   . Gout   . History of benign prostatic hypertrophy   . Hyperlipidemia   . Persistent atrial fibrillation (HCC)    longstanding persistent, asymptomatic  . Prostate cancer ([place])   . Wears glasses    Past Surgical History:  Procedure Laterality Date  . FRACTURE SURGERY     ankles and fingers  . KNEE SURGERY     right; menicus tear   . LYMPHADENECTOMY Bilateral 07/09/2015   Procedure: PELVIC LYMPHADENECTOMY;  Surgeon: Raynelle Bring, MD;  Location: [facility];  Service: Urology;  Laterality: Bilateral;  . ROBOT ASSISTED LAPAROSCOPIC RADICAL PROSTATECTOMY N/A 07/09/2015   Procedure: XI ROBOTIC ASSISTED LAPAROSCOPIC RADICAL PROSTATECTOMY LEVEL 2;  Surgeon: Raynelle Bring, MD;  Location: [facility];  Service: Urology;  Laterality: N/A;  . TONSILLECTOMY    . TOTAL KNEE ARTHROPLASTY Right 03/04/2016   Procedure: RIGHT TOTAL KNEE ARTHROPLASTY;  Surgeon: Sydnee Cabal, MD;  Location: [facility];  Service: Orthopedics;  Laterality: Right;    Current Outpatient Prescriptions  Medication Sig Dispense Refill  . allopurinol (ZYLOPRIM) 300 MG tablet Take 300 mg by mouth daily.      Marland Kitchen apixaban (ELIQUIS) 5 MG TABS tablet Take 1 tablet (5 mg total) by mouth 2  (two) times daily. 180 tablet 0  . cholecalciferol (VITAMIN D) 1000 units tablet Take 1,000 Units by mouth daily.    Marland Kitchen omeprazole (PRILOSEC) 40 MG capsule Take 40 mg by mouth daily.    . simvastatin (ZOCOR) 40 MG tablet Take 40 mg by mouth at bedtime.       No current facility-administered medications for this visit.     No Known Allergies  Social History   Social History  . Marital status: Married    Spouse name: N/A  . Number of children: N/A  . Years of education: N/A   Occupational History  . Financial Planning [employer]   Social History Main Topics  . Smoking status: Former Smoker    Packs/day: 1.00    Years: 20.00    Types: Cigarettes    Quit date: 02/11/1979  . Smokeless tobacco: Never Used  . Alcohol use 12.6 oz/week    21 Glasses of wine per week     Comment: 2-3 glasses of wine daily  . Drug use: No  . Sexual activity: Not Currently   Other Topics Concern  . Not on file   Social History Narrative  . No narrative on file    Family History  Problem Relation Age of Onset  . Heart attack Father   . Alcohol abuse Father     Heavy smoker and drinker  . Cancer Brother     base of tongue   Physical  Exam: Vitals:   05/13/16 0900  BP: 110/82  Pulse: (!) 59  Weight: 213 lb (96.6 kg)  Height: 6\' 2"  (1.88 m)    GEN- The patient is well appearing, alert and oriented x 3 today.   Head- normocephalic, atraumatic Eyes-  Sclera clear, conjunctiva pink Ears- hearing intact Oropharynx- clear Neck- supple  Lungs- Clear to ausculation bilaterally, normal work of breathing Heart- irregular rate and rhythm, no murmurs, rubs or gallops, PMI not laterally displaced GI- soft, NT, ND, + BS Extremities- no clubbing, cyanosis, or edema Neuro- strength and sensation are intact  ekg today reveals atrial fibrillation, V rate 59 bpm, otherwise normal ekg  Assessment and Plan:  1. Longstanding persistent afib Rate controlled CHADS2VASC score is 1.  He will  continue eliquis at this time.  I would favor long term anticoagulation given recent DVT/PTE  2. Recent DVT and PTE in setting of knee surgery Will require at least 3 months of anticoagulation.  Given afib and large PTE, I would favor longer treatment.  I have advised eliquis 5mg  BID to complete 6 months, then 2.5mg  BID thereafter.  He will discuss with Dr Sandi Mariscal and then will decide together. Repeat echo in 3 months to assess RV/ pulmonary pressures post PTE   Return in 1 years, repeat echo in 3 months  Thompson Grayer MD, [facility] 05/13/2016 9:46 AM

## 2016-05-17 DIAGNOSIS — R29898 Other symptoms and signs involving the musculoskeletal system: Secondary | ICD-10-CM | POA: Diagnosis not present

## 2016-05-19 DIAGNOSIS — R29898 Other symptoms and signs involving the musculoskeletal system: Secondary | ICD-10-CM | POA: Diagnosis not present

## 2016-05-21 DIAGNOSIS — I4891 Unspecified atrial fibrillation: Secondary | ICD-10-CM | POA: Diagnosis not present

## 2016-05-21 DIAGNOSIS — Z7901 Long term (current) use of anticoagulants: Secondary | ICD-10-CM | POA: Diagnosis not present

## 2016-05-21 DIAGNOSIS — M25061 Hemarthrosis, right knee: Secondary | ICD-10-CM | POA: Diagnosis not present

## 2016-05-21 DIAGNOSIS — Z96651 Presence of right artificial knee joint: Secondary | ICD-10-CM | POA: Diagnosis not present

## 2016-05-21 DIAGNOSIS — W109XXA Fall (on) (from) unspecified stairs and steps, initial encounter: Secondary | ICD-10-CM | POA: Diagnosis not present

## 2016-05-21 DIAGNOSIS — M25461 Effusion, right knee: Secondary | ICD-10-CM | POA: Diagnosis not present

## 2016-05-21 DIAGNOSIS — W19XXXA Unspecified fall, initial encounter: Secondary | ICD-10-CM | POA: Diagnosis not present

## 2016-05-21 DIAGNOSIS — M25561 Pain in right knee: Secondary | ICD-10-CM | POA: Diagnosis not present

## 2016-05-21 DIAGNOSIS — Z7982 Long term (current) use of aspirin: Secondary | ICD-10-CM | POA: Diagnosis not present

## 2016-05-21 DIAGNOSIS — Z86711 Personal history of pulmonary embolism: Secondary | ICD-10-CM | POA: Diagnosis not present

## 2016-05-21 DIAGNOSIS — Z87891 Personal history of nicotine dependence: Secondary | ICD-10-CM | POA: Diagnosis not present

## 2016-05-21 DIAGNOSIS — S8991XA Unspecified injury of right lower leg, initial encounter: Secondary | ICD-10-CM | POA: Diagnosis not present

## 2016-05-21 DIAGNOSIS — S8981XA Other specified injuries of right lower leg, initial encounter: Secondary | ICD-10-CM | POA: Diagnosis not present

## 2016-05-22 ENCOUNTER — Encounter: Payer: Self-pay | Admitting: Nurse Practitioner

## 2016-05-22 ENCOUNTER — Telehealth: Payer: Self-pay | Admitting: Nurse Practitioner

## 2016-05-22 DIAGNOSIS — W19XXXA Unspecified fall, initial encounter: Secondary | ICD-10-CM | POA: Diagnosis not present

## 2016-05-22 DIAGNOSIS — S8981XA Other specified injuries of right lower leg, initial encounter: Secondary | ICD-10-CM | POA: Diagnosis not present

## 2016-05-22 DIAGNOSIS — Z96651 Presence of right artificial knee joint: Secondary | ICD-10-CM | POA: Diagnosis not present

## 2016-05-22 NOTE — Telephone Encounter (Signed)
   Pt called this AM b/c he slipped, fell, and injured his knee.  He is in an ER in [place].  He has been seen by ortho there and also talked with the PA from [place].  He is planning on coming home from the ED today.  It appears that he will require a surgical intervention for his knee, though timing at this point is unclear.  He plans to f/u with [facility] for further instruction.  He is asking if he should continue his eliquis or not.  I recommended that so long as ortho is ok with him continuing oral anticoagulation, he should continue to do so, given PE in late November.  If a decision is made to pursue an outpt surgery, our office can work with the ortho team to determine a bridging approach (inpt vs outpt, heparin vs lovenox).  Caller verbalized understanding and was grateful for the call back.  Murray Hodgkins, NP 05/22/2016, 9:18 AM

## 2016-05-23 DIAGNOSIS — M25561 Pain in right knee: Secondary | ICD-10-CM | POA: Diagnosis not present

## 2016-05-30 DIAGNOSIS — Z96651 Presence of right artificial knee joint: Secondary | ICD-10-CM | POA: Diagnosis not present

## 2016-05-30 DIAGNOSIS — S76111D Strain of right quadriceps muscle, fascia and tendon, subsequent encounter: Secondary | ICD-10-CM | POA: Diagnosis not present

## 2016-05-30 DIAGNOSIS — Z471 Aftercare following joint replacement surgery: Secondary | ICD-10-CM | POA: Diagnosis not present

## 2016-06-02 ENCOUNTER — Telehealth: Payer: Self-pay | Admitting: Internal Medicine

## 2016-06-02 NOTE — Telephone Encounter (Signed)
Spoke with pt and made him aware clearance was sent over to ortho office.  Pt states that he needs to know what to do about Lovenox bridging recommended by Dr. Rayann Heman.  Pt currently on Eliquis.  Advised I will send message to [facility] clinic and have them contact pt.  Pt appreciative for call.

## 2016-06-02 NOTE — Telephone Encounter (Signed)
New message    Pt is calling to see if his orthopedic doctor has contacted Dr. Rayann Heman about his surgery on 2/20.

## 2016-06-03 NOTE — Telephone Encounter (Signed)
[facility] Clearance re-faxed to [employer] @ [phone].

## 2016-06-03 NOTE — Telephone Encounter (Signed)
Follow Up   Pt states that ortho office did not receieve the surgical clearance letter from Dr. Rayann Heman fax Fax: [phone] [place]

## 2016-06-03 NOTE — Telephone Encounter (Addendum)
Spoke to [employer] with [facility]. Per ortho MD ok to hold Eliquis only 24 hours prior (without lovenox) to procedure and to resume Eliquis evening of procedure.

## 2016-06-03 NOTE — Telephone Encounter (Signed)
I will forward to [facility] in Medical Records, she will refax clearance. I will forward to [facility] to address Eliquis/Lovenox bridging.

## 2016-06-07 DIAGNOSIS — W19XXXA Unspecified fall, initial encounter: Secondary | ICD-10-CM | POA: Diagnosis not present

## 2016-06-07 DIAGNOSIS — M75121 Complete rotator cuff tear or rupture of right shoulder, not specified as traumatic: Secondary | ICD-10-CM | POA: Diagnosis not present

## 2016-06-07 DIAGNOSIS — G8918 Other acute postprocedural pain: Secondary | ICD-10-CM | POA: Diagnosis not present

## 2016-06-07 DIAGNOSIS — M7541 Impingement syndrome of right shoulder: Secondary | ICD-10-CM | POA: Diagnosis not present

## 2016-06-07 DIAGNOSIS — S76111A Strain of right quadriceps muscle, fascia and tendon, initial encounter: Secondary | ICD-10-CM | POA: Diagnosis not present

## 2016-06-20 DIAGNOSIS — S76111D Strain of right quadriceps muscle, fascia and tendon, subsequent encounter: Secondary | ICD-10-CM | POA: Diagnosis not present

## 2016-06-28 DIAGNOSIS — F329 Major depressive disorder, single episode, unspecified: Secondary | ICD-10-CM | POA: Diagnosis not present

## 2016-07-06 DIAGNOSIS — Z8546 Personal history of malignant neoplasm of prostate: Secondary | ICD-10-CM | POA: Diagnosis not present

## 2016-07-11 DIAGNOSIS — M25561 Pain in right knee: Secondary | ICD-10-CM | POA: Diagnosis not present

## 2016-07-13 DIAGNOSIS — M25561 Pain in right knee: Secondary | ICD-10-CM | POA: Diagnosis not present

## 2016-07-13 DIAGNOSIS — N5201 Erectile dysfunction due to arterial insufficiency: Secondary | ICD-10-CM | POA: Diagnosis not present

## 2016-07-13 DIAGNOSIS — Z8546 Personal history of malignant neoplasm of prostate: Secondary | ICD-10-CM | POA: Diagnosis not present

## 2016-07-19 DIAGNOSIS — M25561 Pain in right knee: Secondary | ICD-10-CM | POA: Diagnosis not present

## 2016-07-22 DIAGNOSIS — M25561 Pain in right knee: Secondary | ICD-10-CM | POA: Diagnosis not present

## 2016-07-25 DIAGNOSIS — M25561 Pain in right knee: Secondary | ICD-10-CM | POA: Diagnosis not present

## 2016-07-27 DIAGNOSIS — M25561 Pain in right knee: Secondary | ICD-10-CM | POA: Diagnosis not present

## 2016-08-02 DIAGNOSIS — M25561 Pain in right knee: Secondary | ICD-10-CM | POA: Diagnosis not present

## 2016-08-03 DIAGNOSIS — S76111D Strain of right quadriceps muscle, fascia and tendon, subsequent encounter: Secondary | ICD-10-CM | POA: Diagnosis not present

## 2016-08-05 DIAGNOSIS — M25561 Pain in right knee: Secondary | ICD-10-CM | POA: Diagnosis not present

## 2016-08-08 DIAGNOSIS — M25561 Pain in right knee: Secondary | ICD-10-CM | POA: Diagnosis not present

## 2016-08-08 DIAGNOSIS — S76111D Strain of right quadriceps muscle, fascia and tendon, subsequent encounter: Secondary | ICD-10-CM | POA: Diagnosis not present

## 2016-08-09 ENCOUNTER — Ambulatory Visit (HOSPITAL_COMMUNITY): Payer: PPO | Attending: Cardiovascular Disease

## 2016-08-09 ENCOUNTER — Other Ambulatory Visit: Payer: Self-pay

## 2016-08-09 DIAGNOSIS — I272 Pulmonary hypertension, unspecified: Secondary | ICD-10-CM | POA: Insufficient documentation

## 2016-08-09 DIAGNOSIS — I340 Nonrheumatic mitral (valve) insufficiency: Secondary | ICD-10-CM | POA: Diagnosis not present

## 2016-08-10 DIAGNOSIS — M25561 Pain in right knee: Secondary | ICD-10-CM | POA: Diagnosis not present

## 2016-08-15 DIAGNOSIS — Z96651 Presence of right artificial knee joint: Secondary | ICD-10-CM | POA: Diagnosis not present

## 2016-08-15 DIAGNOSIS — Z4789 Encounter for other orthopedic aftercare: Secondary | ICD-10-CM | POA: Diagnosis not present

## 2016-08-15 DIAGNOSIS — M25561 Pain in right knee: Secondary | ICD-10-CM | POA: Diagnosis not present

## 2016-08-15 DIAGNOSIS — S76111D Strain of right quadriceps muscle, fascia and tendon, subsequent encounter: Secondary | ICD-10-CM | POA: Diagnosis not present

## 2016-08-15 DIAGNOSIS — Z471 Aftercare following joint replacement surgery: Secondary | ICD-10-CM | POA: Diagnosis not present

## 2016-08-18 ENCOUNTER — Ambulatory Visit: Payer: Self-pay | Admitting: Orthopedic Surgery

## 2016-08-23 ENCOUNTER — Ambulatory Visit (HOSPITAL_BASED_OUTPATIENT_CLINIC_OR_DEPARTMENT_OTHER): Admission: RE | Admit: 2016-08-23 | Payer: PPO | Source: Ambulatory Visit | Admitting: Specialist

## 2016-08-23 ENCOUNTER — Encounter (HOSPITAL_BASED_OUTPATIENT_CLINIC_OR_DEPARTMENT_OTHER): Admission: RE | Payer: Self-pay | Source: Ambulatory Visit

## 2016-08-23 DIAGNOSIS — S76111A Strain of right quadriceps muscle, fascia and tendon, initial encounter: Secondary | ICD-10-CM | POA: Diagnosis not present

## 2016-08-23 DIAGNOSIS — Y999 Unspecified external cause status: Secondary | ICD-10-CM | POA: Diagnosis not present

## 2016-08-23 DIAGNOSIS — G8918 Other acute postprocedural pain: Secondary | ICD-10-CM | POA: Diagnosis not present

## 2016-08-23 SURGERY — REPAIR, TENDON, QUADRICEPS
Anesthesia: General | Laterality: Right

## 2016-09-05 DIAGNOSIS — S76111D Strain of right quadriceps muscle, fascia and tendon, subsequent encounter: Secondary | ICD-10-CM | POA: Diagnosis not present

## 2016-09-29 ENCOUNTER — Other Ambulatory Visit: Payer: Self-pay | Admitting: Internal Medicine

## 2016-10-05 DIAGNOSIS — S76111D Strain of right quadriceps muscle, fascia and tendon, subsequent encounter: Secondary | ICD-10-CM | POA: Diagnosis not present

## 2016-10-05 DIAGNOSIS — Z4789 Encounter for other orthopedic aftercare: Secondary | ICD-10-CM | POA: Diagnosis not present

## 2016-10-06 DIAGNOSIS — R29898 Other symptoms and signs involving the musculoskeletal system: Secondary | ICD-10-CM | POA: Diagnosis not present

## 2016-10-10 DIAGNOSIS — R29898 Other symptoms and signs involving the musculoskeletal system: Secondary | ICD-10-CM | POA: Diagnosis not present

## 2016-10-11 DIAGNOSIS — F329 Major depressive disorder, single episode, unspecified: Secondary | ICD-10-CM | POA: Diagnosis not present

## 2016-10-14 DIAGNOSIS — R29898 Other symptoms and signs involving the musculoskeletal system: Secondary | ICD-10-CM | POA: Diagnosis not present

## 2016-10-17 DIAGNOSIS — R29898 Other symptoms and signs involving the musculoskeletal system: Secondary | ICD-10-CM | POA: Diagnosis not present

## 2016-10-25 DIAGNOSIS — R29898 Other symptoms and signs involving the musculoskeletal system: Secondary | ICD-10-CM | POA: Diagnosis not present

## 2016-10-26 ENCOUNTER — Other Ambulatory Visit: Payer: Self-pay

## 2016-10-26 NOTE — Patient Outreach (Signed)
[employer] questionnaire screening call:  10/25/2016 Placed call to patient to review health team advantage questionnaire. Patient identified himself.  MD visits:  Patient reports that he has normal follow up with MD. Reports no current issue needing to see MD any more than normal. Patient reports that has been to the emergency department one time with a diagnoses of pulmonary embolism. States this occurred after a knee replacement.   Atrial Fibrillation: patient confirms that he has not had any problems with his atrial fib. Reports that he takes his medications as prescribed. Denies any problems with medications.  Smoking: Reports he is not currently smoking.  Reviewed any needs with patient and he declines any current issues at this time.  Offered to mail letter and magnet and patient has agreed. Confirmed address.  No further needs identified. Tomasa Rand, RN, BSN, CEN [facility] [employer] [phone]

## 2016-10-28 DIAGNOSIS — R29898 Other symptoms and signs involving the musculoskeletal system: Secondary | ICD-10-CM | POA: Diagnosis not present

## 2016-11-01 DIAGNOSIS — R29898 Other symptoms and signs involving the musculoskeletal system: Secondary | ICD-10-CM | POA: Diagnosis not present

## 2016-11-02 DIAGNOSIS — S76111D Strain of right quadriceps muscle, fascia and tendon, subsequent encounter: Secondary | ICD-10-CM | POA: Diagnosis not present

## 2016-11-02 DIAGNOSIS — Z4789 Encounter for other orthopedic aftercare: Secondary | ICD-10-CM | POA: Diagnosis not present

## 2016-11-04 DIAGNOSIS — R29898 Other symptoms and signs involving the musculoskeletal system: Secondary | ICD-10-CM | POA: Diagnosis not present

## 2016-11-08 DIAGNOSIS — R29898 Other symptoms and signs involving the musculoskeletal system: Secondary | ICD-10-CM | POA: Diagnosis not present

## 2016-11-11 DIAGNOSIS — R29898 Other symptoms and signs involving the musculoskeletal system: Secondary | ICD-10-CM | POA: Diagnosis not present

## 2016-11-15 DIAGNOSIS — R29898 Other symptoms and signs involving the musculoskeletal system: Secondary | ICD-10-CM | POA: Diagnosis not present

## 2016-11-18 DIAGNOSIS — R29898 Other symptoms and signs involving the musculoskeletal system: Secondary | ICD-10-CM | POA: Diagnosis not present

## 2016-11-22 DIAGNOSIS — M25561 Pain in right knee: Secondary | ICD-10-CM | POA: Diagnosis not present

## 2016-12-06 DIAGNOSIS — M25561 Pain in right knee: Secondary | ICD-10-CM | POA: Diagnosis not present

## 2016-12-09 DIAGNOSIS — R29898 Other symptoms and signs involving the musculoskeletal system: Secondary | ICD-10-CM | POA: Diagnosis not present

## 2016-12-12 DIAGNOSIS — Z96651 Presence of right artificial knee joint: Secondary | ICD-10-CM | POA: Diagnosis not present

## 2016-12-12 DIAGNOSIS — Z4789 Encounter for other orthopedic aftercare: Secondary | ICD-10-CM | POA: Diagnosis not present

## 2016-12-12 DIAGNOSIS — S76111D Strain of right quadriceps muscle, fascia and tendon, subsequent encounter: Secondary | ICD-10-CM | POA: Diagnosis not present

## 2016-12-12 DIAGNOSIS — Z471 Aftercare following joint replacement surgery: Secondary | ICD-10-CM | POA: Diagnosis not present

## 2016-12-14 DIAGNOSIS — M25561 Pain in right knee: Secondary | ICD-10-CM | POA: Diagnosis not present

## 2016-12-21 DIAGNOSIS — R29898 Other symptoms and signs involving the musculoskeletal system: Secondary | ICD-10-CM | POA: Diagnosis not present

## 2016-12-27 DIAGNOSIS — R29898 Other symptoms and signs involving the musculoskeletal system: Secondary | ICD-10-CM | POA: Diagnosis not present

## 2017-01-04 DIAGNOSIS — R29898 Other symptoms and signs involving the musculoskeletal system: Secondary | ICD-10-CM | POA: Diagnosis not present

## 2017-01-11 DIAGNOSIS — S76111D Strain of right quadriceps muscle, fascia and tendon, subsequent encounter: Secondary | ICD-10-CM | POA: Diagnosis not present

## 2017-01-11 DIAGNOSIS — Z4789 Encounter for other orthopedic aftercare: Secondary | ICD-10-CM | POA: Diagnosis not present

## 2017-01-11 DIAGNOSIS — R29898 Other symptoms and signs involving the musculoskeletal system: Secondary | ICD-10-CM | POA: Diagnosis not present

## 2017-01-11 DIAGNOSIS — Z8546 Personal history of malignant neoplasm of prostate: Secondary | ICD-10-CM | POA: Diagnosis not present

## 2017-01-18 DIAGNOSIS — N5201 Erectile dysfunction due to arterial insufficiency: Secondary | ICD-10-CM | POA: Diagnosis not present

## 2017-01-18 DIAGNOSIS — Z8546 Personal history of malignant neoplasm of prostate: Secondary | ICD-10-CM | POA: Diagnosis not present

## 2017-02-21 DIAGNOSIS — D229 Melanocytic nevi, unspecified: Secondary | ICD-10-CM | POA: Diagnosis not present

## 2017-02-21 DIAGNOSIS — L821 Other seborrheic keratosis: Secondary | ICD-10-CM | POA: Diagnosis not present

## 2017-03-05 IMAGING — MR MR LUMBAR SPINE W/O CM
4 of 5 series · 21 of 48 positions shown · non-contrast
Comparison: None.

CLINICAL DATA: Left thigh pain and weakness for 3 weeks

EXAM:
MRI LUMBAR SPINE WITHOUT CONTRAST
TECHNIQUE: Multiplanar, multisequence MR imaging of the lumbar spine was
performed. No intravenous contrast was administered.

[Series 6: T2 · sagittal · 4.0mm · 0.73mm/px · 7 of 15 slices shown (1 of 2)]
[im 1/15]
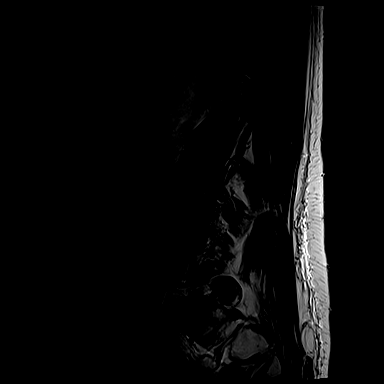
[im 3/15]
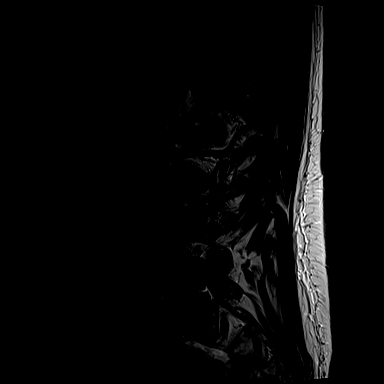
[im 5/15]
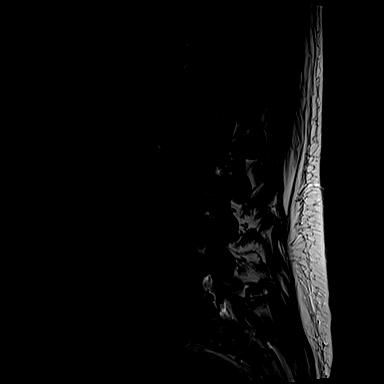
[im 8/15]
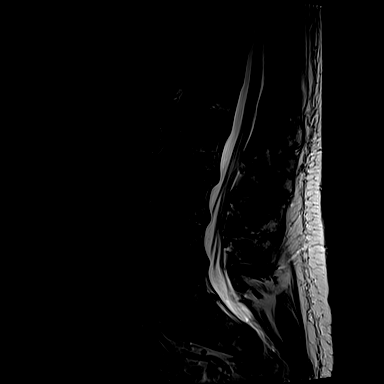
[im 10/15]
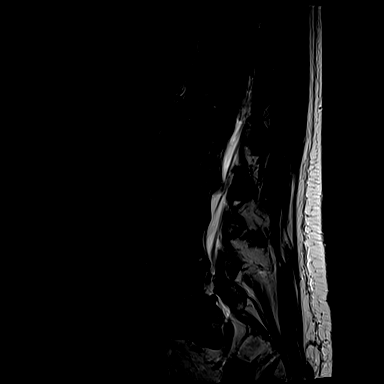
[im 12/15]
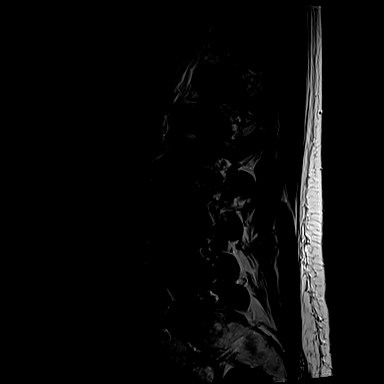
[im 15/15]
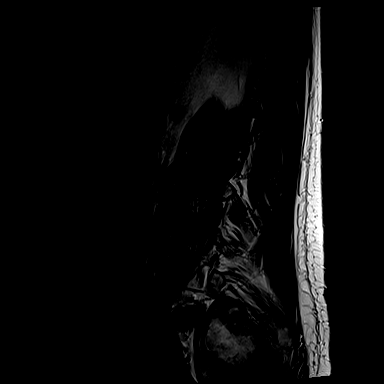

[Series 12: STIR · sagittal · 4.0mm · 0.88mm/px · 3 of 15 slices shown]
[im 3/15]
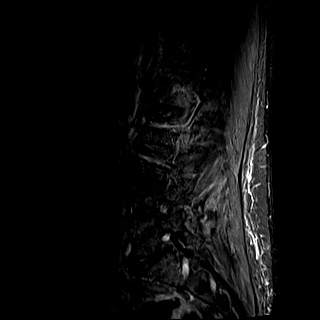
[im 8/15]
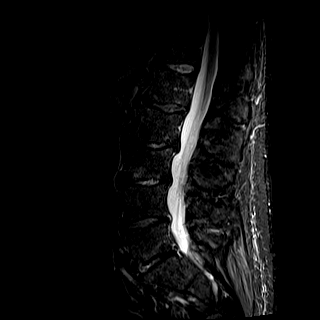
[im 12/15]
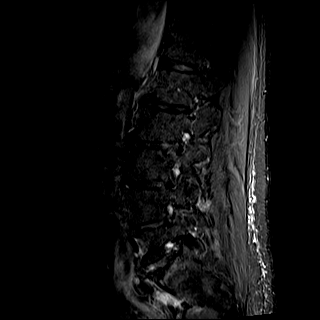

[Series 13: T1 · sagittal · 4.0mm · 0.73mm/px · 3 of 15 slices shown]
[im 3/15]
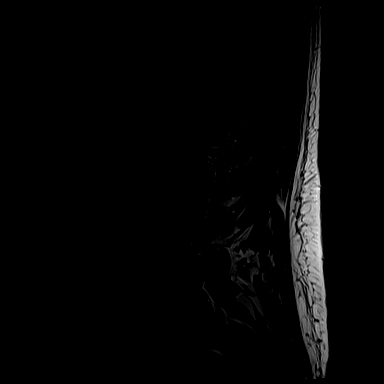
[im 9/15]
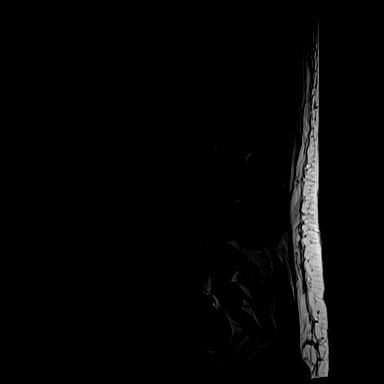
[im 15/15]
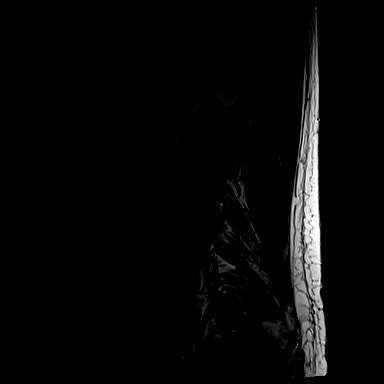

[Series 18: T2 · axial · 4.0mm · 0.28mm/px · z∈[-120,+59]mm · 8 of 34 slices shown (2 of 2)]
[im 1/34]
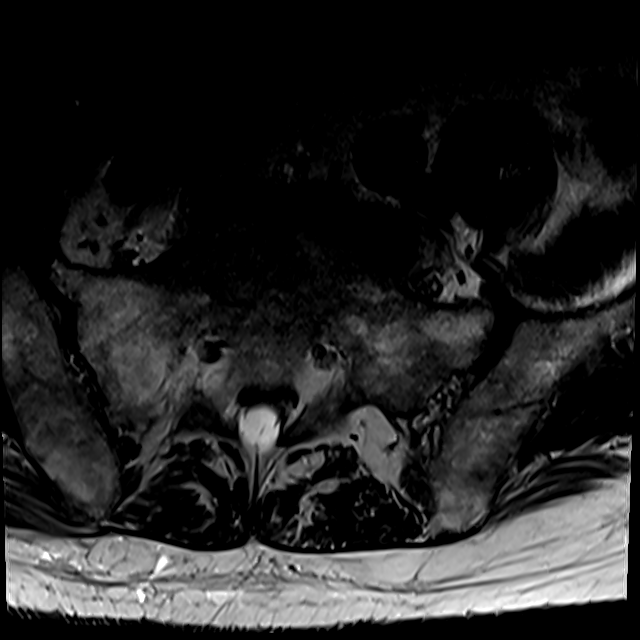
[im 6/34]
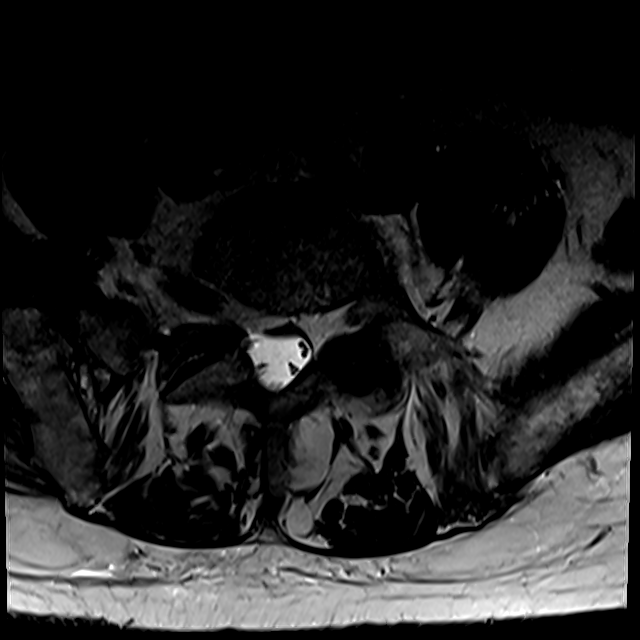
[im 11/34]
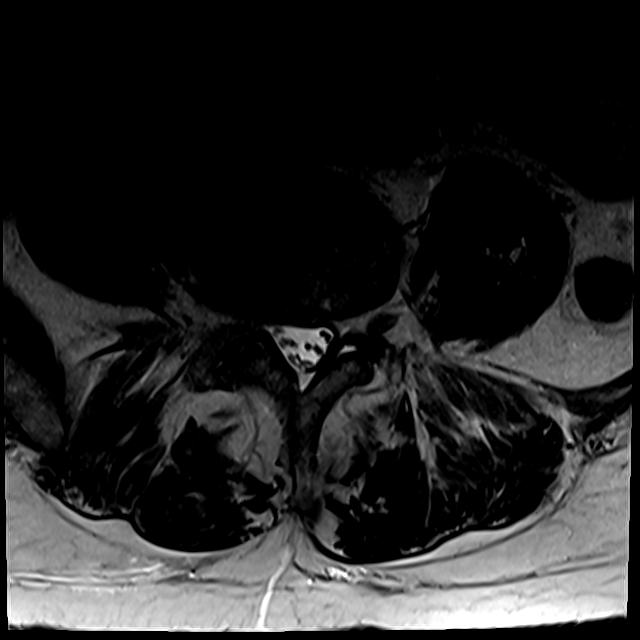
[im 16/34]
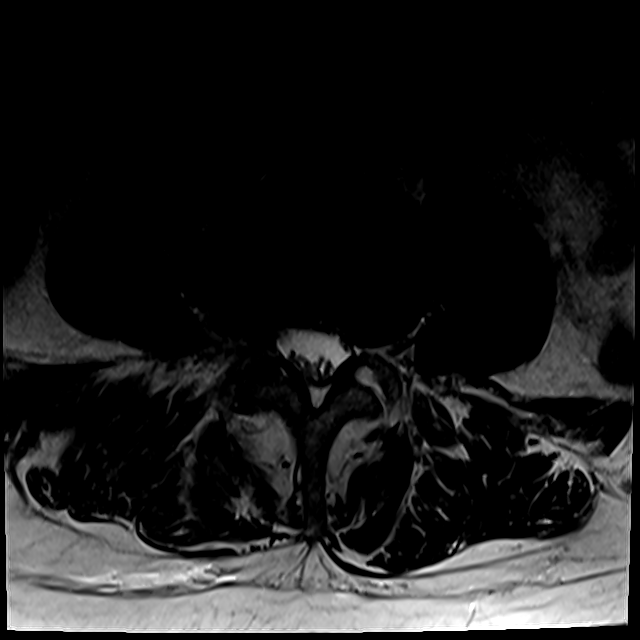
[im 18/34]
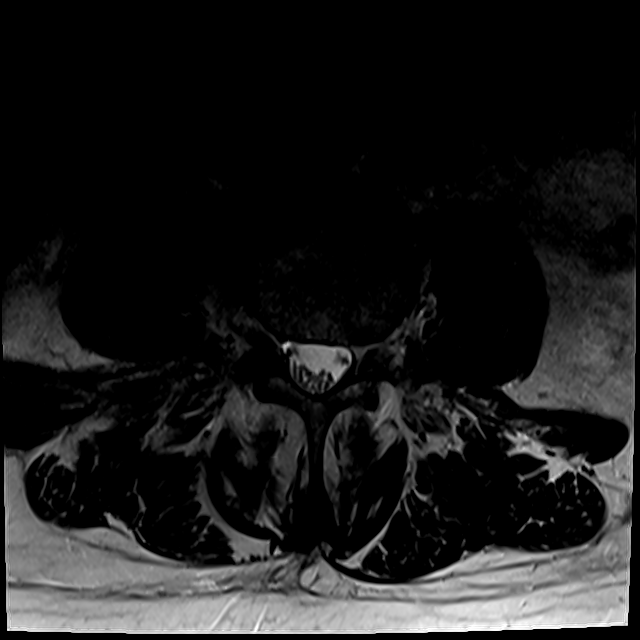
[im 23/34]
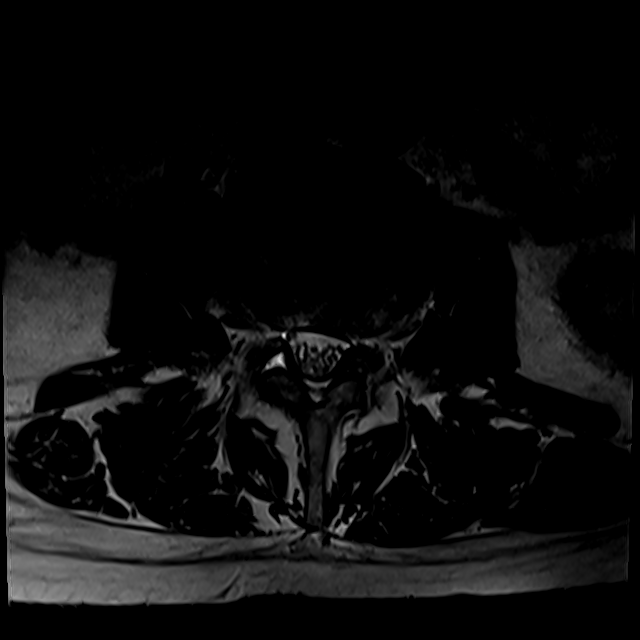
[im 28/34]
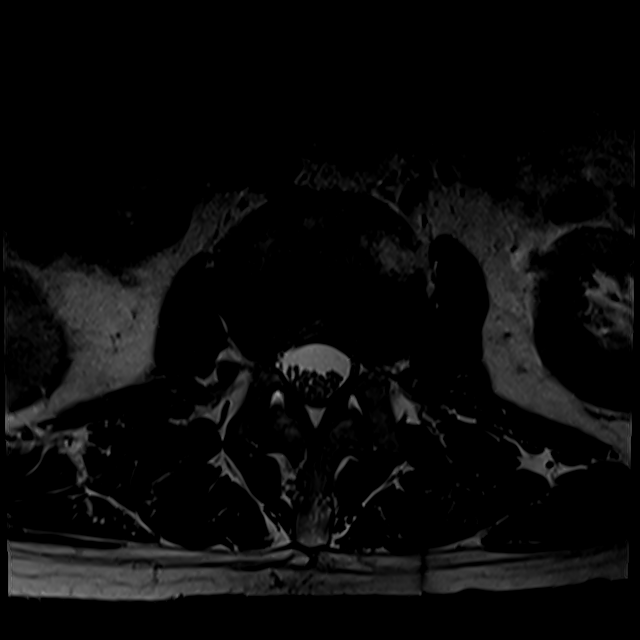
[im 34/34]
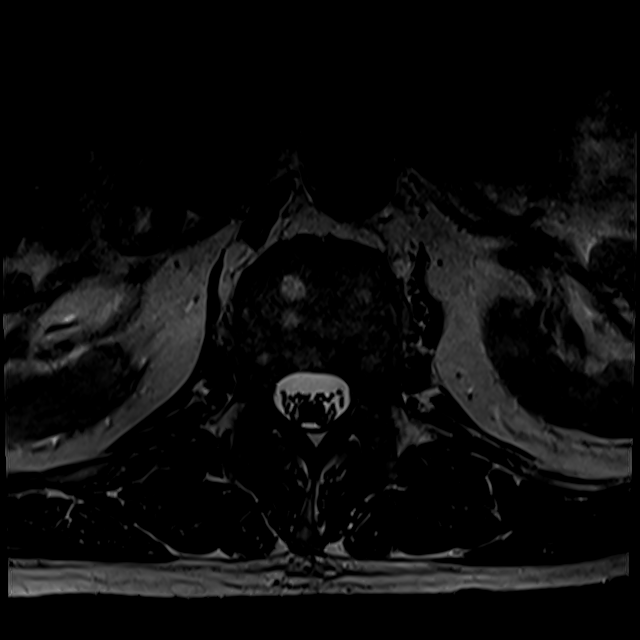

[21 of 48 positions shown; findings below may reference images not displayed]

FINDINGS: Segmentation: Conventional anatomy assistant, with the last open
disc space designated L5-S1.

Alignment: There is 4 mm of retrolisthesis of L2 on L3.

Bones: The vertebral bodies of the lumbar spine are normal in size.
There is normal bone marrow signal demonstrated throughout the
vertebra. There are mild degenerative changes of bilateral SI
joints.

Conus medullaris: Extends to the L1 level and appears normal. The
nerve roots of the cauda equina and the filum terminale are normal.

Paraspinal and other soft tissues: There is no focal abnormality.
The imaged intra-abdominal contents are unremarkable.

Disc levels:

Disc spaces: Degenerative disc disease with disc height loss at
L2-3, L3-4, L4-5 and L5-S1.

T12-L1: No significant disc bulge. No evidence of neural foraminal
stenosis. No central canal stenosis.

L1-L2: No significant disc bulge. No evidence of neural foraminal
stenosis. No central canal stenosis.

L2-L3: Mild broad-based disc bulge with a left paracentral/
foraminal disc protrusion with mild mass effect on the left
intraspinal L3 nerve root. No evidence of neural foraminal stenosis.
No central canal stenosis.

L3-L4: Broad-based disc bulge. No evidence of neural foraminal
stenosis. No central canal stenosis.

L4-L5: Broad-based disc bulge. Mild bilateral facet arthropathy. No
evidence of neural foraminal stenosis. No central canal stenosis.

L5-S1: Mild broad-based disc bulge. Mild bilateral facet
arthropathy. No evidence of neural foraminal stenosis. No central
canal stenosis.
IMPRESSION: 1. At L2-3 there is a mild broad-based disc bulge with a left
paracentral/ foraminal disc protrusion with mild mass effect on the
left intraspinal L3 nerve root.

## 2017-03-08 DIAGNOSIS — R7302 Impaired glucose tolerance (oral): Secondary | ICD-10-CM | POA: Diagnosis not present

## 2017-03-08 DIAGNOSIS — I481 Persistent atrial fibrillation: Secondary | ICD-10-CM | POA: Diagnosis not present

## 2017-03-08 DIAGNOSIS — C61 Malignant neoplasm of prostate: Secondary | ICD-10-CM | POA: Diagnosis not present

## 2017-03-08 DIAGNOSIS — Z23 Encounter for immunization: Secondary | ICD-10-CM | POA: Diagnosis not present

## 2017-03-08 DIAGNOSIS — N200 Calculus of kidney: Secondary | ICD-10-CM | POA: Diagnosis not present

## 2017-03-08 DIAGNOSIS — Z0001 Encounter for general adult medical examination with abnormal findings: Secondary | ICD-10-CM | POA: Diagnosis not present

## 2017-03-08 DIAGNOSIS — E7800 Pure hypercholesterolemia, unspecified: Secondary | ICD-10-CM | POA: Diagnosis not present

## 2017-03-08 DIAGNOSIS — M109 Gout, unspecified: Secondary | ICD-10-CM | POA: Diagnosis not present

## 2017-03-08 DIAGNOSIS — I2510 Atherosclerotic heart disease of native coronary artery without angina pectoris: Secondary | ICD-10-CM | POA: Diagnosis not present

## 2017-04-21 DIAGNOSIS — J209 Acute bronchitis, unspecified: Secondary | ICD-10-CM | POA: Diagnosis not present

## 2017-05-10 DIAGNOSIS — S76119A Strain of unspecified quadriceps muscle, fascia and tendon, initial encounter: Secondary | ICD-10-CM

## 2017-05-10 DIAGNOSIS — Z4789 Encounter for other orthopedic aftercare: Secondary | ICD-10-CM | POA: Diagnosis not present

## 2017-05-10 HISTORY — DX: Strain of unspecified quadriceps muscle, fascia and tendon, initial encounter: S76.119A

## 2017-05-15 ENCOUNTER — Encounter: Payer: Self-pay | Admitting: Internal Medicine

## 2017-05-15 ENCOUNTER — Ambulatory Visit: Payer: PPO | Admitting: Internal Medicine

## 2017-05-15 VITALS — BP 124/64 | HR 57 | Ht 74.0 in | Wt 215.0 lb

## 2017-05-15 DIAGNOSIS — I480 Paroxysmal atrial fibrillation: Secondary | ICD-10-CM | POA: Diagnosis not present

## 2017-05-15 MED ORDER — ROSUVASTATIN CALCIUM 20 MG PO TABS: 20.0000 mg | ORAL_TABLET | Freq: Every day | ORAL | 3 refills | Status: DC

## 2017-05-15 NOTE — Progress Notes (Signed)
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  Since last being seen in our clinic, the patient reports doing very well. He remains active and continues to ride his bike.  He had a quad tear over a year ago and continues to have some weakness from this.  Today, he denies symptoms of palpitations, chest pain, shortness of breath,  lower extremity edema, dizziness, presyncope, or syncope.  The patient is otherwise without complaint today.   Past Medical History:  Diagnosis Date  . Aortic root enlargement (HCC)    aortic root 57mm in size  . Atrial flutter ([place])   . Biatrial enlargement   . Colonic polyp   . DJD (degenerative joint disease)   . DVT (deep venous thrombosis) ([place]) 03/1016   post knee surgery, on eliquis  . Dysrhythmia   . Gout   . History of benign prostatic hypertrophy   . Hyperlipidemia   . Persistent atrial fibrillation (HCC)    longstanding persistent, asymptomatic  . Prostate cancer ([place])   . Pulmonary emboli ([place]) 03/2016   from DVT post knee surgery  . Wears glasses    Past Surgical History:  Procedure Laterality Date  . FRACTURE SURGERY     ankles and fingers  . KNEE SURGERY     right; menicus tear   . LYMPHADENECTOMY Bilateral 07/09/2015   Procedure: PELVIC LYMPHADENECTOMY;  Surgeon: Raynelle Bring, MD;  Location: [facility];  Service: Urology;  Laterality: Bilateral;  . ROBOT ASSISTED LAPAROSCOPIC RADICAL PROSTATECTOMY N/A 07/09/2015   Procedure: XI ROBOTIC ASSISTED LAPAROSCOPIC RADICAL PROSTATECTOMY LEVEL 2;  Surgeon: Raynelle Bring, MD;  [facility];  Service: Urology;  Laterality: N/A;  . TONSILLECTOMY    . TOTAL KNEE ARTHROPLASTY Right 03/04/2016   Procedure: RIGHT TOTAL KNEE ARTHROPLASTY;  Surgeon: Sydnee Cabal, MD;  [facility];  Service: Orthopedics;  Laterality: Right;    ROS- all systems are reviewed and negatives except as per HPI  above  Current Outpatient Medications  Medication Sig Dispense Refill  . allopurinol (ZYLOPRIM) 300 MG tablet Take 300 mg by mouth daily.      . cholecalciferol (VITAMIN D) 1000 units tablet Take 1,000 Units by mouth daily.    Marland Kitchen ELIQUIS 5 MG TABS tablet TAKE 1 TABLET BY MOUTH TWICE DAILY 180 tablet 1  . omeprazole (PRILOSEC) 40 MG capsule Take 40 mg by mouth daily.    . simvastatin (ZOCOR) 40 MG tablet Take 40 mg by mouth at bedtime.       No current facility-administered medications for this visit.     Physical Exam: Vitals:   05/15/17 1206  BP: 124/64  Pulse: (!) 57  Weight: 215 lb (97.5 kg)  Height: 6\' 2"  (1.88 m)    GEN- The patient is well appearing, alert and oriented x 3 today.   Head- normocephalic, atraumatic Eyes-  Sclera clear, conjunctiva pink Ears- hearing intact Oropharynx- clear Lungs- Clear to ausculation bilaterally, normal work of breathing Heart- irregular rate and rhythm, no murmurs, rubs or gallops, PMI not laterally displaced GI- soft, NT, ND, + BS Extremities- no clubbing, cyanosis, or edema  EKG tracing ordered today is personally reviewed and shows afiv, V rate 57 bpm, no ischemic changes  Echo 08/09/16 reveals WEf 55%, mild MR, moderate to severely enlarged LA  Assessment and Plan:  1. Longstanding persistent afib Rate controlled chads2vasc score is 1 (age). Doing well  with eliquis  2. DVT/ PTE On eliquis  Return to see me in a year  Thompson Grayer MD, [facility] 05/15/2017 12:46 PM

## 2017-05-15 NOTE — Patient Instructions (Addendum)
Medication Instructions:  Your physician recommends that you continue on your current medications as directed. Please refer to the Current Medication list given to you today.   Labwork: None ordered   Testing/Procedures: None ordered   Follow-Up: Your physician wants you to follow-up in: 12 months with Dr Rayann Heman Please call our office to schedule the follow-up appointment.    cardiac medications before your next appointment, please call your pharmacy.

## 2017-05-22 DIAGNOSIS — Z1159 Encounter for screening for other viral diseases: Secondary | ICD-10-CM | POA: Diagnosis not present

## 2017-05-22 DIAGNOSIS — E785 Hyperlipidemia, unspecified: Secondary | ICD-10-CM | POA: Diagnosis not present

## 2017-07-17 ENCOUNTER — Other Ambulatory Visit: Payer: Self-pay | Admitting: Internal Medicine

## 2017-07-19 DIAGNOSIS — Z8546 Personal history of malignant neoplasm of prostate: Secondary | ICD-10-CM | POA: Diagnosis not present

## 2017-07-26 DIAGNOSIS — Z8546 Personal history of malignant neoplasm of prostate: Secondary | ICD-10-CM | POA: Diagnosis not present

## 2017-07-26 DIAGNOSIS — N5201 Erectile dysfunction due to arterial insufficiency: Secondary | ICD-10-CM | POA: Diagnosis not present

## 2017-08-09 DIAGNOSIS — Z9889 Other specified postprocedural states: Secondary | ICD-10-CM | POA: Diagnosis not present

## 2018-02-07 DIAGNOSIS — Z8546 Personal history of malignant neoplasm of prostate: Secondary | ICD-10-CM | POA: Diagnosis not present

## 2018-02-14 DIAGNOSIS — Z8546 Personal history of malignant neoplasm of prostate: Secondary | ICD-10-CM | POA: Diagnosis not present

## 2018-02-14 DIAGNOSIS — N5201 Erectile dysfunction due to arterial insufficiency: Secondary | ICD-10-CM | POA: Diagnosis not present

## 2018-04-19 ENCOUNTER — Other Ambulatory Visit: Payer: Self-pay | Admitting: Internal Medicine

## 2018-04-19 NOTE — Telephone Encounter (Signed)
Pt last saw Dr Rayann Heman 05/15/17, due for 1 year follow-up this month.  Last las in chart are from 2017, no recent labwork in chart.  Called pt's primary MD Dr Sandi Mariscal requested labwork to be faxed. Pt's age is 89, weight 97.5 kg. Will await lab results to refill rx.

## 2018-04-19 NOTE — Telephone Encounter (Signed)
Received labwork Creat 0.81 on 03/05/18.  Will refill rx with message to pt will need OV for future refills. Pt is on appropriate dosage of Eliquis 5mg  BID.

## 2018-05-28 ENCOUNTER — Ambulatory Visit: Payer: PPO | Admitting: Internal Medicine

## 2018-05-28 ENCOUNTER — Encounter: Payer: Self-pay | Admitting: Internal Medicine

## 2018-05-28 VITALS — BP 118/76 | HR 64 | Ht 74.0 in | Wt 225.2 lb

## 2018-05-28 DIAGNOSIS — I4819 Other persistent atrial fibrillation: Secondary | ICD-10-CM

## 2018-05-28 DIAGNOSIS — Z86718 Personal history of other venous thrombosis and embolism: Secondary | ICD-10-CM | POA: Diagnosis not present

## 2018-05-28 NOTE — Progress Notes (Addendum)
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  Since last being seen in our clinic, the patient reports doing very well.  He still rides his bike without limitation.  Today, he denies symptoms of palpitations, chest pain, shortness of breath,  lower extremity edema, dizziness, presyncope, or syncope.  The patient is otherwise without complaint today.   Past Medical History:  Diagnosis Date  . Aortic root enlargement (HCC)    aortic root 57mm in size  . Atrial flutter ([place])   . Biatrial enlargement   . Colonic polyp   . DJD (degenerative joint disease)   . DVT (deep venous thrombosis) ([place]) 03/1016   post knee surgery, on eliquis  . Dysrhythmia   . Gout   . History of benign prostatic hypertrophy   . Hyperlipidemia   . Persistent atrial fibrillation    longstanding persistent, asymptomatic  . Prostate cancer ([place])   . Pulmonary emboli ([place]) 03/2016   from DVT post knee surgery  . Wears glasses    Past Surgical History:  Procedure Laterality Date  . FRACTURE SURGERY     ankles and fingers  . KNEE SURGERY     right; menicus tear   . LYMPHADENECTOMY Bilateral 07/09/2015   Procedure: PELVIC LYMPHADENECTOMY;  Surgeon: Raynelle Bring, MD;  [facility];  Service: Urology;  Laterality: Bilateral;  . ROBOT ASSISTED LAPAROSCOPIC RADICAL PROSTATECTOMY N/A 07/09/2015   Procedure: XI ROBOTIC ASSISTED LAPAROSCOPIC RADICAL PROSTATECTOMY LEVEL 2;  Surgeon: Raynelle Bring, MD;  [facility];  Service: Urology;  Laterality: N/A;  . TONSILLECTOMY    . TOTAL KNEE ARTHROPLASTY Right 03/04/2016   Procedure: RIGHT TOTAL KNEE ARTHROPLASTY;  Surgeon: Sydnee Cabal, MD;  [facility];  Service: Orthopedics;  Laterality: Right;    ROS- all systems are reviewed and negatives except as per HPI above  Current Outpatient Medications  Medication Sig Dispense Refill  . allopurinol (ZYLOPRIM) 300 MG  tablet Take 300 mg by mouth daily.      . cholecalciferol (VITAMIN D) 1000 units tablet Take 1,000 Units by mouth daily.    Marland Kitchen ELIQUIS 5 MG TABS tablet TAKE 1 TABLET BY MOUTH TWICE DAILY (Patient taking differently: Patient reports that he has only been taking this once qd) 180 tablet 0  . omeprazole (PRILOSEC) 40 MG capsule Take 40 mg by mouth daily.    . rosuvastatin (CRESTOR) 20 MG tablet Take 1 tablet (20 mg total) by mouth daily. 90 tablet 3   No current facility-administered medications for this visit.     Physical Exam: Vitals:   05/28/18 0856  BP: 118/76  Pulse: 64  SpO2: 98%  Weight: 225 lb 3.2 oz (102.2 kg)  Height: 6\' 2"  (1.88 m)    GEN- The patient is well appearing, alert and oriented x 3 today.   Head- normocephalic, atraumatic Eyes-  Sclera clear, conjunctiva pink Ears- hearing intact Oropharynx- clear Lungs- Clear to ausculation bilaterally, normal work of breathing Heart- irregular rate and rhythm, no murmurs, rubs or gallops, PMI not laterally displaced GI- soft, NT, ND, + BS Extremities- no clubbing, cyanosis, or edema  Wt Readings from Last 3 Encounters:  05/28/18 225 lb 3.2 oz (102.2 kg)  05/15/17 215 lb (97.5 kg)  05/13/16 213 lb (96.6 kg)    EKG tracing ordered today is personally reviewed and shows afib, V rate 61 bpm  Assessment and Plan:  1.  Permanent afib Well controlled chads2vascs core is 1. On eliquis for prior DVT and PTE  2. DVT/PTE  On eliquis  We discussed his risks of stroke with afib and also risks of PTE/DVT.  We discussed pros and cons of anticoagulation at length.  He would prefer to stay on anticoagulation.  I have advised dosing recommendations as per guidelines today.  Return to see EP PA in a year  Thompson Grayer MD, [facility] 05/28/2018 9:18 AM

## 2018-05-28 NOTE — Patient Instructions (Addendum)
Medication Instructions:  Your physician recommends that you continue on your current medications as directed. Please refer to the Current Medication list given to you today.  Labwork: None ordered.  Testing/Procedures: None ordered.  Follow-Up: Your physician wants you to follow-up in: one year with EP APP.   You will receive a reminder letter in the mail two months in advance. If you don't receive a letter, please call our office to schedule the follow-up appointment.  Any Other Special Instructions Will Be Listed Below (If Applicable).  If you need a refill on your cardiac medications before your next appointment, please call your pharmacy.

## 2018-06-14 DIAGNOSIS — Z8546 Personal history of malignant neoplasm of prostate: Secondary | ICD-10-CM | POA: Diagnosis not present

## 2018-06-14 DIAGNOSIS — E785 Hyperlipidemia, unspecified: Secondary | ICD-10-CM | POA: Diagnosis not present

## 2018-08-21 DIAGNOSIS — Z8546 Personal history of malignant neoplasm of prostate: Secondary | ICD-10-CM | POA: Diagnosis not present

## 2018-08-28 DIAGNOSIS — N5201 Erectile dysfunction due to arterial insufficiency: Secondary | ICD-10-CM | POA: Diagnosis not present

## 2018-08-28 DIAGNOSIS — Z8546 Personal history of malignant neoplasm of prostate: Secondary | ICD-10-CM | POA: Diagnosis not present

## 2018-09-18 ENCOUNTER — Other Ambulatory Visit: Payer: Self-pay | Admitting: Internal Medicine

## 2018-09-18 NOTE — Telephone Encounter (Signed)
Age 73, weight 102kg, SCr 0.81 on 03/05/18, last OV 05/28/18, appropriate for refill for Eliquis 5mg  BID for afib indication.

## 2018-12-04 DIAGNOSIS — L821 Other seborrheic keratosis: Secondary | ICD-10-CM | POA: Diagnosis not present

## 2018-12-04 DIAGNOSIS — I872 Venous insufficiency (chronic) (peripheral): Secondary | ICD-10-CM | POA: Diagnosis not present

## 2018-12-04 DIAGNOSIS — I83893 Varicose veins of bilateral lower extremities with other complications: Secondary | ICD-10-CM | POA: Diagnosis not present

## 2018-12-18 DIAGNOSIS — I8312 Varicose veins of left lower extremity with inflammation: Secondary | ICD-10-CM | POA: Diagnosis not present

## 2018-12-18 DIAGNOSIS — I8311 Varicose veins of right lower extremity with inflammation: Secondary | ICD-10-CM | POA: Diagnosis not present

## 2019-01-02 DIAGNOSIS — I8311 Varicose veins of right lower extremity with inflammation: Secondary | ICD-10-CM | POA: Diagnosis not present

## 2019-01-02 DIAGNOSIS — I8312 Varicose veins of left lower extremity with inflammation: Secondary | ICD-10-CM | POA: Diagnosis not present

## 2019-01-09 DIAGNOSIS — I8311 Varicose veins of right lower extremity with inflammation: Secondary | ICD-10-CM | POA: Diagnosis not present

## 2019-01-09 DIAGNOSIS — I8312 Varicose veins of left lower extremity with inflammation: Secondary | ICD-10-CM | POA: Diagnosis not present

## 2019-01-30 DIAGNOSIS — I8311 Varicose veins of right lower extremity with inflammation: Secondary | ICD-10-CM | POA: Diagnosis not present

## 2019-01-30 DIAGNOSIS — I8312 Varicose veins of left lower extremity with inflammation: Secondary | ICD-10-CM | POA: Diagnosis not present

## 2019-02-04 DIAGNOSIS — Z23 Encounter for immunization: Secondary | ICD-10-CM | POA: Diagnosis not present

## 2019-02-20 DIAGNOSIS — I8311 Varicose veins of right lower extremity with inflammation: Secondary | ICD-10-CM | POA: Diagnosis not present

## 2019-02-26 DIAGNOSIS — I8312 Varicose veins of left lower extremity with inflammation: Secondary | ICD-10-CM | POA: Diagnosis not present

## 2019-03-01 DIAGNOSIS — I8311 Varicose veins of right lower extremity with inflammation: Secondary | ICD-10-CM | POA: Diagnosis not present

## 2019-03-07 DIAGNOSIS — Z8546 Personal history of malignant neoplasm of prostate: Secondary | ICD-10-CM | POA: Diagnosis not present

## 2019-03-07 DIAGNOSIS — E785 Hyperlipidemia, unspecified: Secondary | ICD-10-CM | POA: Diagnosis not present

## 2019-03-19 DIAGNOSIS — R7302 Impaired glucose tolerance (oral): Secondary | ICD-10-CM | POA: Diagnosis not present

## 2019-03-19 DIAGNOSIS — Z0000 Encounter for general adult medical examination without abnormal findings: Secondary | ICD-10-CM | POA: Diagnosis not present

## 2019-03-19 DIAGNOSIS — I2510 Atherosclerotic heart disease of native coronary artery without angina pectoris: Secondary | ICD-10-CM | POA: Diagnosis not present

## 2019-03-19 DIAGNOSIS — I340 Nonrheumatic mitral (valve) insufficiency: Secondary | ICD-10-CM | POA: Diagnosis not present

## 2019-03-19 DIAGNOSIS — Z23 Encounter for immunization: Secondary | ICD-10-CM | POA: Diagnosis not present

## 2019-03-19 DIAGNOSIS — I4819 Other persistent atrial fibrillation: Secondary | ICD-10-CM | POA: Diagnosis not present

## 2019-03-22 DIAGNOSIS — I8312 Varicose veins of left lower extremity with inflammation: Secondary | ICD-10-CM | POA: Diagnosis not present

## 2019-03-26 DIAGNOSIS — Z8546 Personal history of malignant neoplasm of prostate: Secondary | ICD-10-CM | POA: Diagnosis not present

## 2019-03-29 DIAGNOSIS — I8311 Varicose veins of right lower extremity with inflammation: Secondary | ICD-10-CM | POA: Diagnosis not present

## 2019-04-08 DIAGNOSIS — I8311 Varicose veins of right lower extremity with inflammation: Secondary | ICD-10-CM | POA: Diagnosis not present

## 2019-04-09 DIAGNOSIS — C61 Malignant neoplasm of prostate: Secondary | ICD-10-CM | POA: Diagnosis not present

## 2019-04-22 DIAGNOSIS — I8312 Varicose veins of left lower extremity with inflammation: Secondary | ICD-10-CM | POA: Diagnosis not present

## 2019-05-07 ENCOUNTER — Ambulatory Visit: Payer: PPO | Attending: Internal Medicine

## 2019-05-07 DIAGNOSIS — Z23 Encounter for immunization: Secondary | ICD-10-CM | POA: Insufficient documentation

## 2019-05-07 NOTE — Progress Notes (Signed)
   Covid-19 Vaccination Clinic  Name:  MANFORD ECKHARDT    MRN: [id number] DOB: 06/19/1945  05/07/2019  Mr. Olgin was observed post Covid-19 immunization for 15 minutes without incidence. He was provided with Vaccine Information Sheet and instruction to access the V-Safe system.   Mr. Bruegger was instructed to call 911 with any severe reactions post vaccine: Marland Kitchen Difficulty breathing  . Swelling of your face and throat  . A fast heartbeat  . A bad rash all over your body  . Dizziness and weakness    Immunizations Administered    Name Date Dose VIS Date Route   Pfizer COVID-19 Vaccine 05/07/2019  6:03 PM 0.3 mL 03/29/2019 Intramuscular   Manufacturer: [place]   Lot: [id number]   [place]: [id number]

## 2019-05-10 DIAGNOSIS — M1000 Idiopathic gout, unspecified site: Secondary | ICD-10-CM | POA: Diagnosis not present

## 2019-05-10 DIAGNOSIS — M109 Gout, unspecified: Secondary | ICD-10-CM | POA: Diagnosis not present

## 2019-05-27 ENCOUNTER — Ambulatory Visit: Payer: PPO | Attending: Internal Medicine

## 2019-05-27 DIAGNOSIS — Z23 Encounter for immunization: Secondary | ICD-10-CM | POA: Insufficient documentation

## 2019-05-27 NOTE — Progress Notes (Signed)
   Covid-19 Vaccination Clinic  Name:  Zachary Sanchez    MRN: [id number] DOB: 06-Dec-1945  05/27/2019  [name] was observed post Covid-19 immunization for 15 minutes without incidence. He was provided with Vaccine Information Sheet and instruction to access the V-Safe system.   [name] was instructed to call 911 with any severe reactions post vaccine: Marland Kitchen Difficulty breathing  . Swelling of your face and throat  . A fast heartbeat  . A bad rash all over your body  . Dizziness and weakness    Immunizations Administered    Name Date Dose VIS Date Route   Pfizer COVID-19 Vaccine 05/27/2019 10:15 AM 0.3 mL 03/29/2019 Intramuscular   Manufacturer: [place]   Lot: [id number]   [place]: [id number]

## 2019-06-17 ENCOUNTER — Telehealth (INDEPENDENT_AMBULATORY_CARE_PROVIDER_SITE_OTHER): Payer: PPO | Admitting: Internal Medicine

## 2019-06-17 DIAGNOSIS — Z86718 Personal history of other venous thrombosis and embolism: Secondary | ICD-10-CM

## 2019-06-17 DIAGNOSIS — I4821 Permanent atrial fibrillation: Secondary | ICD-10-CM | POA: Diagnosis not present

## 2019-06-17 DIAGNOSIS — Z86711 Personal history of pulmonary embolism: Secondary | ICD-10-CM | POA: Diagnosis not present

## 2019-06-17 DIAGNOSIS — D6869 Other thrombophilia: Secondary | ICD-10-CM

## 2019-06-17 DIAGNOSIS — E785 Hyperlipidemia, unspecified: Secondary | ICD-10-CM | POA: Diagnosis not present

## 2019-06-17 DIAGNOSIS — Z7901 Long term (current) use of anticoagulants: Secondary | ICD-10-CM | POA: Diagnosis not present

## 2019-06-17 DIAGNOSIS — I4819 Other persistent atrial fibrillation: Secondary | ICD-10-CM

## 2019-06-17 NOTE — Progress Notes (Signed)
Electrophysiology TeleHealth Note   Due to national recommendations of social distancing due to COVID 19, an audio/video telehealth visit is felt to be most appropriate for this patient at this time.  See MyChart message from today for the patient's consent to telehealth for [facility].  Date:  06/17/2019   ID:  Zachary Sanchez, DOB Oct 15, 1945, MRN [id number]  Location: patient's home  Provider location:  [facility]  Evaluation Performed: Follow-up visit  PCP:  Derinda Late, MD   Electrophysiologist:  Dr Rayann Heman  Chief Complaint:  palpitations  History of Present Illness:    Zachary Sanchez is a 74 y.o. male who presents via telehealth conferencing today.  Since last being seen in our clinic, the patient reports doing very well.  He exercises at the gym regularly and rides his bike frequently.  Today, he denies symptoms of palpitations, chest pain, shortness of breath,  lower extremity edema, dizziness, presyncope, or syncope.  The patient is otherwise without complaint today.  The patient denies symptoms of fevers, chills, cough, or new SOB worrisome for COVID 19.  Past Medical History:  Diagnosis Date  . Aortic root enlargement (HCC)    aortic root 39mm in size  . Atrial flutter ([place])   . Biatrial enlargement   . Colonic polyp   . DJD (degenerative joint disease)   . DVT (deep venous thrombosis) ([place]) 03/1016   post knee surgery, on eliquis  . Dysrhythmia   . Gout   . History of benign prostatic hypertrophy   . Hyperlipidemia   . Persistent atrial fibrillation    longstanding persistent, asymptomatic  . Prostate cancer ([place])   . Pulmonary emboli ([place]) 03/2016   from DVT post knee surgery  . Wears glasses     Past Surgical History:  Procedure Laterality Date  . FRACTURE SURGERY     ankles and fingers  . KNEE SURGERY     right; menicus tear   . LYMPHADENECTOMY Bilateral 07/09/2015   Procedure: PELVIC LYMPHADENECTOMY;  Surgeon: Raynelle Bring, MD;  [facility];  Service: Urology;  Laterality: Bilateral;  . ROBOT ASSISTED LAPAROSCOPIC RADICAL PROSTATECTOMY N/A 07/09/2015   Procedure: XI ROBOTIC ASSISTED LAPAROSCOPIC RADICAL PROSTATECTOMY LEVEL 2;  Surgeon: Raynelle Bring, MD;  [facility];  Service: Urology;  Laterality: N/A;  . TONSILLECTOMY    . TOTAL KNEE ARTHROPLASTY Right 03/04/2016   Procedure: RIGHT TOTAL KNEE ARTHROPLASTY;  Surgeon: Sydnee Cabal, MD;  [facility];  Service: Orthopedics;  Laterality: Right;    Current Outpatient Medications  Medication Sig Dispense Refill  . allopurinol (ZYLOPRIM) 300 MG tablet Take 300 mg by mouth daily.      . cholecalciferol (VITAMIN D) 1000 units tablet Take 1,000 Units by mouth daily.    Marland Kitchen ELIQUIS 5 MG TABS tablet Take 1 tablet by mouth twice daily 180 tablet 1  . omeprazole (PRILOSEC) 40 MG capsule Take 40 mg by mouth daily.    . rosuvastatin (CRESTOR) 20 MG tablet Take 1 tablet (20 mg total) by mouth daily. 90 tablet 3   No current facility-administered medications for this visit.    Allergies:   Patient has no known allergies.   Social History:  The patient  reports that he quit smoking about 40 years ago. His smoking use included cigarettes. He has a 20.00 pack-year smoking history. He has never used smokeless tobacco. He reports current alcohol use of about 21.0 standard drinks of alcohol per week. He reports that he does  not use drugs.   Family History:  The patient's family history includes Alcohol abuse in his father; Cancer in his brother; Heart attack in his father.   ROS:  Please see the history of present illness.   All other systems are personally reviewed and negative.    Exam:    Vital Signs:  There were no vitals taken for this visit.  Well sounding and appearing, alert and conversant, regular work of breathing,  good skin color Eyes- anicteric, neuro- grossly intact, skin- no apparent rash or lesions or cyanosis, mouth- oral mucosa is pink  Labs/Other Tests and  Data Reviewed:    Recent Labs: No results found for requested labs within last 8760 hours.   Wt Readings from Last 3 Encounters:  05/28/18 225 lb 3.2 oz (102.2 kg)  05/15/17 215 lb (97.5 kg)  05/13/16 213 lb (96.6 kg)      ASSESSMENT & PLAN:    1.  Permanent afib Rate controlled Asymptomatic chads2vasc score is 1. He is on eliquis for prior DVT/PTE  2. DVT/PTE On eliquis  3. HL On crestor Will obtain labs from Dr Sandi Mariscal  Follow-up:  Return to see EP PA in a year   Patient Risk:  after full review of this patients clinical status, I feel that they are at moderate risk at this time.  Today, I have spent 15 minutes with the patient with telehealth technology discussing arrhythmia management .    Army Fossa, MD  06/17/2019 3:29 PM     [address] [address] [phone] (office) [phone] (fax)

## 2019-06-19 ENCOUNTER — Telehealth: Payer: Self-pay | Admitting: *Deleted

## 2019-06-19 DIAGNOSIS — E785 Hyperlipidemia, unspecified: Secondary | ICD-10-CM | POA: Diagnosis not present

## 2019-06-19 NOTE — Telephone Encounter (Signed)
   [facility] Pre-operative Risk Assessment    Request for surgical clearance:  1. What type of surgery is being performed? COLONOSCOPY   2. When is this surgery scheduled? 07/08/19   3. What type of clearance is required (medical clearance vs. Pharmacy clearance to hold med vs. Both)? BOTH  4. Are there any medications that need to be held prior to surgery and how long? ELIQUIS   5. Practice name and name of physician performing surgery? [facility]; DR. JEFFREY MEDOFF   6. What is your office phone number [phone]    7.   What is your office fax number [phone]  8.   Anesthesia type (None, local, MAC, general) ? NOT LISTED; PROPOFOL?   Julaine Hua 06/19/2019, 9:33 AM  _________________________________________________________________   (provider comments below)

## 2019-06-19 NOTE — Telephone Encounter (Signed)
   Primary Cardiologist: Dr. Rayann Heman  Chart reviewed as part of pre-operative protocol coverage. Given past medical history and time since last visit, based on ACC/AHA guidelines, Zachary Sanchez would be at acceptable risk for the planned procedure without further cardiovascular testing. Patient was seen via virtual visit by Dr. Rayann Heman 2 days ago, at which time, he was doing well.   I will route this recommendation to the requesting party via Epic fax function and remove from pre-op pool.  Please call with questions. Per our clinical pharmacist, recommend hold Eliquis for 1 days prior to the procedure.   [place], [place] 06/19/2019, 11:54 AM

## 2019-06-19 NOTE — Telephone Encounter (Signed)
Pt takes Eliquis for afib with CHADS2VASc score of 1 (age), also has hx of PE and DVT (after knee surgery in 2017). He was started on Eliquis after his PE/DVT. Renal function is normal.  Ok to hold Eliquis for 1 day prior to procedure.

## 2019-06-19 NOTE — Telephone Encounter (Signed)
Clinical pharmacist to review eliquis 

## 2019-06-24 ENCOUNTER — Telehealth: Payer: Self-pay

## 2019-06-24 NOTE — Telephone Encounter (Signed)
Call placed to Dr. Deboraha Sprang office.  Requested last OV note and labs. Dr. Deboraha Sprang office requesting Dr. Jackalyn Lombard last note.  Will fax to [phone]

## 2019-06-24 NOTE — Telephone Encounter (Signed)
-----   Message from Thompson Grayer, MD sent at 06/17/2019  3:28 PM EST ----- Please obtain office notes and labs from Dr Sandi Mariscal  Return to see Joseph Art in a year

## 2019-06-25 NOTE — Telephone Encounter (Signed)
Faxed JA OV note to Dr. Sandi Mariscal.  Requested last OV note and labs from Dr. Sandi Mariscal.  Will await

## 2019-07-08 DIAGNOSIS — K635 Polyp of colon: Secondary | ICD-10-CM | POA: Diagnosis not present

## 2019-07-08 DIAGNOSIS — K648 Other hemorrhoids: Secondary | ICD-10-CM | POA: Diagnosis not present

## 2019-07-08 DIAGNOSIS — D124 Benign neoplasm of descending colon: Secondary | ICD-10-CM | POA: Diagnosis not present

## 2019-07-08 DIAGNOSIS — Z86010 Personal history of colonic polyps: Secondary | ICD-10-CM | POA: Diagnosis not present

## 2019-07-08 DIAGNOSIS — Z1211 Encounter for screening for malignant neoplasm of colon: Secondary | ICD-10-CM | POA: Diagnosis not present

## 2019-07-08 DIAGNOSIS — K5730 Diverticulosis of large intestine without perforation or abscess without bleeding: Secondary | ICD-10-CM | POA: Diagnosis not present

## 2019-09-26 DIAGNOSIS — C61 Malignant neoplasm of prostate: Secondary | ICD-10-CM | POA: Diagnosis not present

## 2019-10-01 DIAGNOSIS — Z8546 Personal history of malignant neoplasm of prostate: Secondary | ICD-10-CM | POA: Diagnosis not present

## 2019-10-03 ENCOUNTER — Other Ambulatory Visit: Payer: Self-pay | Admitting: Internal Medicine

## 2019-10-03 NOTE — Telephone Encounter (Signed)
Eliquis 5mg  refill request received. Patient is 74 years old, weight-102.2kg, Crea- 0.87 on 03/07/2019 via scanned labs from [facility], Diagnosis-Afib, and last seen by Dr. Rayann Heman on 06/17/2019 via video visit. Dose is appropriate based on dosing criteria. Will send in refill to requested pharmacy.

## 2019-10-16 DIAGNOSIS — I8312 Varicose veins of left lower extremity with inflammation: Secondary | ICD-10-CM | POA: Diagnosis not present

## 2019-10-16 DIAGNOSIS — I87323 Chronic venous hypertension (idiopathic) with inflammation of bilateral lower extremity: Secondary | ICD-10-CM | POA: Diagnosis not present

## 2019-10-16 DIAGNOSIS — I8311 Varicose veins of right lower extremity with inflammation: Secondary | ICD-10-CM | POA: Diagnosis not present

## 2019-10-16 DIAGNOSIS — E785 Hyperlipidemia, unspecified: Secondary | ICD-10-CM | POA: Diagnosis not present

## 2019-12-27 DIAGNOSIS — J069 Acute upper respiratory infection, unspecified: Secondary | ICD-10-CM | POA: Diagnosis not present

## 2019-12-27 DIAGNOSIS — Z03818 Encounter for observation for suspected exposure to other biological agents ruled out: Secondary | ICD-10-CM | POA: Diagnosis not present

## 2020-01-13 DIAGNOSIS — Z20822 Contact with and (suspected) exposure to covid-19: Secondary | ICD-10-CM | POA: Diagnosis not present

## 2020-02-12 DIAGNOSIS — M25561 Pain in right knee: Secondary | ICD-10-CM | POA: Diagnosis not present

## 2020-02-12 DIAGNOSIS — Z9889 Other specified postprocedural states: Secondary | ICD-10-CM | POA: Diagnosis not present

## 2020-02-28 DIAGNOSIS — M25561 Pain in right knee: Secondary | ICD-10-CM | POA: Diagnosis not present

## 2020-03-09 DIAGNOSIS — Z8546 Personal history of malignant neoplasm of prostate: Secondary | ICD-10-CM | POA: Diagnosis not present

## 2020-03-19 DIAGNOSIS — Z0000 Encounter for general adult medical examination without abnormal findings: Secondary | ICD-10-CM | POA: Diagnosis not present

## 2020-03-19 DIAGNOSIS — E7800 Pure hypercholesterolemia, unspecified: Secondary | ICD-10-CM | POA: Diagnosis not present

## 2020-03-19 DIAGNOSIS — I4819 Other persistent atrial fibrillation: Secondary | ICD-10-CM | POA: Diagnosis not present

## 2020-03-19 DIAGNOSIS — M109 Gout, unspecified: Secondary | ICD-10-CM | POA: Diagnosis not present

## 2020-03-19 DIAGNOSIS — I2510 Atherosclerotic heart disease of native coronary artery without angina pectoris: Secondary | ICD-10-CM | POA: Diagnosis not present

## 2020-03-19 DIAGNOSIS — I340 Nonrheumatic mitral (valve) insufficiency: Secondary | ICD-10-CM | POA: Diagnosis not present

## 2020-03-19 DIAGNOSIS — K760 Fatty (change of) liver, not elsewhere classified: Secondary | ICD-10-CM | POA: Diagnosis not present

## 2020-03-19 DIAGNOSIS — R7302 Impaired glucose tolerance (oral): Secondary | ICD-10-CM | POA: Diagnosis not present

## 2020-03-19 DIAGNOSIS — C61 Malignant neoplasm of prostate: Secondary | ICD-10-CM | POA: Diagnosis not present

## 2020-03-20 DIAGNOSIS — M25561 Pain in right knee: Secondary | ICD-10-CM | POA: Diagnosis not present

## 2020-03-25 ENCOUNTER — Telehealth: Payer: Self-pay | Admitting: Internal Medicine

## 2020-03-25 NOTE — Telephone Encounter (Signed)
Advised the patient that I will check with Dr. Rayann Heman when he is back in the office and call him with an update.   Verbalized and agrees with plan.

## 2020-03-25 NOTE — Telephone Encounter (Signed)
Pt c/o medication issue:  1. Name of Medication: ELIQUIS 5 MG TABS tablet  2. How are you currently taking this medication (dosage and times per day)? 1 tablet twice daily   3. Are you having a reaction (difficulty breathing--STAT)? No   4. What is your medication issue? Zachary Sanchez is calling requesting a switch to an alternative medication he previously discussed with Dr. Rayann Heman. He states he doesn't remember the name of it, but remembers discussing only having to take it once daily. Please advise.

## 2020-03-27 NOTE — Telephone Encounter (Signed)
Patient returning call.

## 2020-03-27 NOTE — Telephone Encounter (Signed)
Left message to call back  

## 2020-03-27 NOTE — Telephone Encounter (Signed)
Asked patient to get blood work faxed from PCP for Dr. Rayann Heman.

## 2020-03-31 DIAGNOSIS — Z8546 Personal history of malignant neoplasm of prostate: Secondary | ICD-10-CM | POA: Diagnosis not present

## 2020-04-01 MED ORDER — RIVAROXABAN 20 MG PO TABS: 20.0000 mg | ORAL_TABLET | Freq: Every day | ORAL | 3 refills | Status: DC

## 2020-04-01 NOTE — Addendum Note (Signed)
Addended by: Darrell Jewel on: 04/01/2020 07:51 AM   Modules accepted: Orders

## 2020-04-01 NOTE — Telephone Encounter (Signed)
Per Dr. Rayann Heman okay to change medication at patient request.  Reviewed labs, will get scanned in to epic.   Xarelto 20 mg ordered. Advised patient how to switch from Eliquis and take new medication.  Patient verbalized understanding.

## 2020-04-07 DIAGNOSIS — Z9889 Other specified postprocedural states: Secondary | ICD-10-CM | POA: Diagnosis not present

## 2020-04-07 DIAGNOSIS — G8311 Monoplegia of lower limb affecting right dominant side: Secondary | ICD-10-CM | POA: Diagnosis not present

## 2020-04-23 DIAGNOSIS — G8311 Monoplegia of lower limb affecting right dominant side: Secondary | ICD-10-CM | POA: Diagnosis not present

## 2020-04-23 DIAGNOSIS — Z9889 Other specified postprocedural states: Secondary | ICD-10-CM | POA: Diagnosis not present

## 2020-04-27 DIAGNOSIS — G8311 Monoplegia of lower limb affecting right dominant side: Secondary | ICD-10-CM | POA: Diagnosis not present

## 2020-04-27 DIAGNOSIS — Z9889 Other specified postprocedural states: Secondary | ICD-10-CM | POA: Diagnosis not present

## 2020-04-30 DIAGNOSIS — G8311 Monoplegia of lower limb affecting right dominant side: Secondary | ICD-10-CM | POA: Diagnosis not present

## 2020-04-30 DIAGNOSIS — Z9889 Other specified postprocedural states: Secondary | ICD-10-CM | POA: Diagnosis not present

## 2020-05-07 DIAGNOSIS — Z9889 Other specified postprocedural states: Secondary | ICD-10-CM | POA: Diagnosis not present

## 2020-05-07 DIAGNOSIS — G8311 Monoplegia of lower limb affecting right dominant side: Secondary | ICD-10-CM | POA: Diagnosis not present

## 2020-05-08 DIAGNOSIS — N5201 Erectile dysfunction due to arterial insufficiency: Secondary | ICD-10-CM | POA: Diagnosis not present

## 2020-05-08 DIAGNOSIS — Z8546 Personal history of malignant neoplasm of prostate: Secondary | ICD-10-CM | POA: Diagnosis not present

## 2020-05-11 DIAGNOSIS — Z9889 Other specified postprocedural states: Secondary | ICD-10-CM | POA: Diagnosis not present

## 2020-05-11 DIAGNOSIS — G8311 Monoplegia of lower limb affecting right dominant side: Secondary | ICD-10-CM | POA: Diagnosis not present

## 2020-05-14 DIAGNOSIS — G8311 Monoplegia of lower limb affecting right dominant side: Secondary | ICD-10-CM | POA: Diagnosis not present

## 2020-05-14 DIAGNOSIS — Z9889 Other specified postprocedural states: Secondary | ICD-10-CM | POA: Diagnosis not present

## 2020-05-18 DIAGNOSIS — Z9889 Other specified postprocedural states: Secondary | ICD-10-CM | POA: Diagnosis not present

## 2020-05-19 DIAGNOSIS — Z7901 Long term (current) use of anticoagulants: Secondary | ICD-10-CM | POA: Diagnosis not present

## 2020-05-19 DIAGNOSIS — R112 Nausea with vomiting, unspecified: Secondary | ICD-10-CM | POA: Diagnosis not present

## 2020-05-19 DIAGNOSIS — Z8719 Personal history of other diseases of the digestive system: Secondary | ICD-10-CM | POA: Diagnosis not present

## 2020-05-19 DIAGNOSIS — R1013 Epigastric pain: Secondary | ICD-10-CM | POA: Diagnosis not present

## 2020-05-20 DIAGNOSIS — T84022D Instability of internal right knee prosthesis, subsequent encounter: Secondary | ICD-10-CM | POA: Diagnosis not present

## 2020-05-20 DIAGNOSIS — R531 Weakness: Secondary | ICD-10-CM | POA: Diagnosis not present

## 2020-05-20 DIAGNOSIS — S76191D Other specified injury of right quadriceps muscle, fascia and tendon, subsequent encounter: Secondary | ICD-10-CM | POA: Diagnosis not present

## 2020-05-21 DIAGNOSIS — Z9889 Other specified postprocedural states: Secondary | ICD-10-CM | POA: Diagnosis not present

## 2020-06-18 ENCOUNTER — Encounter: Payer: Self-pay | Admitting: Student

## 2020-06-18 ENCOUNTER — Ambulatory Visit: Payer: PPO | Admitting: Student

## 2020-06-18 ENCOUNTER — Other Ambulatory Visit: Payer: Self-pay

## 2020-06-18 VITALS — BP 104/70 | HR 49 | Ht 74.0 in | Wt 221.4 lb

## 2020-06-18 DIAGNOSIS — I4819 Other persistent atrial fibrillation: Secondary | ICD-10-CM

## 2020-06-18 DIAGNOSIS — I10 Essential (primary) hypertension: Secondary | ICD-10-CM

## 2020-06-18 DIAGNOSIS — Z86718 Personal history of other venous thrombosis and embolism: Secondary | ICD-10-CM | POA: Diagnosis not present

## 2020-06-18 NOTE — Progress Notes (Signed)
PCP:  Derinda Late, MD Primary Cardiologist: No primary care provider on file. Electrophysiologist: Thompson Grayer, MD   Zachary Sanchez is a 75 y.o. male seen today for Thompson Grayer, MD for routine electrophysiology followup.  Since last being seen in our clinic the patient reports doing very well.  he denies chest pain, palpitations, dyspnea, PND, orthopnea, nausea, vomiting, dizziness, syncope, edema, weight gain, or early satiety.  Past Medical History:  Diagnosis Date  . Aortic root enlargement (HCC)    aortic root 23mm in size  . Atrial flutter ([place])   . Biatrial enlargement   . Colonic polyp   . DJD (degenerative joint disease)   . DVT (deep venous thrombosis) ([place]) 03/1016   post knee surgery, on eliquis  . Dysrhythmia   . Gout   . History of benign prostatic hypertrophy   . Hyperlipidemia   . Persistent atrial fibrillation (HCC)    longstanding persistent, asymptomatic  . Prostate cancer ([place])   . Pulmonary emboli ([place]) 03/2016   from DVT post knee surgery  . Wears glasses    Past Surgical History:  Procedure Laterality Date  . FRACTURE SURGERY     ankles and fingers  . KNEE SURGERY     right; menicus tear   . LYMPHADENECTOMY Bilateral 07/09/2015   Procedure: PELVIC LYMPHADENECTOMY;  Surgeon: Raynelle Bring, MD;  Location: [facility];  Service: Urology;  Laterality: Bilateral;  . ROBOT ASSISTED LAPAROSCOPIC RADICAL PROSTATECTOMY N/A 07/09/2015   Procedure: XI ROBOTIC ASSISTED LAPAROSCOPIC RADICAL PROSTATECTOMY LEVEL 2;  Surgeon: Raynelle Bring, MD;  Location: [facility];  Service: Urology;  Laterality: N/A;  . TONSILLECTOMY    . TOTAL KNEE ARTHROPLASTY Right 03/04/2016   Procedure: RIGHT TOTAL KNEE ARTHROPLASTY;  Surgeon: Sydnee Cabal, MD;  Location: [facility];  Service: Orthopedics;  Laterality: Right;    Current Outpatient Medications  Medication Sig Dispense Refill  . allopurinol (ZYLOPRIM) 300 MG tablet Take 300 mg by mouth daily.    Marland Kitchen apixaban (ELIQUIS) 5 MG TABS  tablet Take 1 tablet by mouth daily.    . cholecalciferol (VITAMIN D) 1000 units tablet Take 1,000 Units by mouth daily.    Marland Kitchen ezetimibe (ZETIA) 10 MG tablet Take 10 mg by mouth daily.    . indomethacin (INDOCIN) 50 MG capsule Take 50 mg by mouth 3 (three) times daily as needed. Gout attack    . omeprazole (PRILOSEC) 40 MG capsule Take 40 mg by mouth daily.    . ondansetron (ZOFRAN-ODT) 4 MG disintegrating tablet Take 1 tablet by mouth as needed.    . rivaroxaban (XARELTO) 20 MG TABS tablet Take 1 tablet (20 mg total) by mouth daily with supper. 90 tablet 3  . rosuvastatin (CRESTOR) 40 MG tablet Take 1 tablet by mouth daily.    Marland Kitchen zolpidem (AMBIEN) 10 MG tablet Take 5-10 mg by mouth at bedtime as needed.     No current facility-administered medications for this visit.    No Known Allergies  Social History   Socioeconomic History  . Marital status: Married    Spouse name: Not on file  . Number of children: Not on file  . Years of education: Not on file  . Highest education level: Not on file  Occupational History  . Occupation: Transport planner: [employer]  Tobacco Use  . Smoking status: Former Smoker    Packs/day: 1.00    Years: 20.00    Pack years: 20.00    Types: Cigarettes  Quit date: 02/11/1979    Years since quitting: 41.3  . Smokeless tobacco: Never Used  Substance and Sexual Activity  . Alcohol use: Yes    Alcohol/week: 21.0 standard drinks    Types: 21 Glasses of wine per week    Comment: 2-3 glasses of wine daily  . Drug use: No  . Sexual activity: Not Currently  Other Topics Concern  . Not on file  Social History Narrative  . Not on file   Social Determinants of Health   Financial Resource Strain: Not on file  Food Insecurity: Not on file  Transportation Needs: Not on file  Physical Activity: Not on file  Stress: Not on file  Social Connections: Not on file  Intimate Partner Violence: Not on file     Review of Systems: General:  No chills, fever, night sweats or weight changes  Cardiovascular:  No chest pain, dyspnea on exertion, edema, orthopnea, palpitations, paroxysmal nocturnal dyspnea Dermatological: No rash, lesions or masses Respiratory: No cough, dyspnea Urologic: No hematuria, dysuria Abdominal: No nausea, vomiting, diarrhea, bright red blood per rectum, melena, or hematemesis Neurologic: No visual changes, weakness, changes in mental status All other systems reviewed and are otherwise negative except as noted above.  Physical Exam: Vitals:   06/18/20 1219  BP: 104/70  Pulse: (!) 49  SpO2: 98%  Weight: 221 lb 6.4 oz (100.4 kg)  Height: 6\' 2"  (1.88 m)    GEN- The patient is well appearing, alert and oriented x 3 today.   HEENT: normocephalic, atraumatic; sclera clear, conjunctiva pink; hearing intact; oropharynx clear; neck supple, no JVP Lymph- no cervical lymphadenopathy Lungs- Clear to ausculation bilaterally, normal work of breathing.  No wheezes, rales, rhonchi Heart- Regular rate and rhythm, no murmurs, rubs or gallops, PMI not laterally displaced GI- soft, non-tender, non-distended, bowel sounds present, no hepatosplenomegaly Extremities- no clubbing, cyanosis, or edema; DP/PT/radial pulses 2+ bilaterally MS- no significant deformity or atrophy Skin- warm and dry, no rash or lesion Psych- euthymic mood, full affect Neuro- strength and sensation are intact  EKG is not ordered.   Additional studies reviewed include: Previous EP office notes  Assessment and Plan:  1. Permanent AF Rate controlled Asymptomatic CHA2DS2VASC of 4 now given age. He is on Xarelto for prior DVT/PTE  2. DVT/PE Continue Xarelto  3. HTN OK on current medications  Annamaria Helling  06/18/20 12:28 PM

## 2020-06-18 NOTE — Patient Instructions (Signed)
Medication Instructions:  Your physician recommends that you continue on your current medications as directed. Please refer to the Current Medication list given to you today.  *If you need a refill on your cardiac medications before your next appointment, please call your pharmacy*   Lab Work: TODAY: BMET, CBC  If you have labs (blood work) drawn today and your tests are completely normal, you will receive your results only by: MyChart Message (if you have MyChart) OR A paper copy in the mail If you have any lab test that is abnormal or we need to change your treatment, we will call you to review the results.   Follow-Up: At [facility], you and your health needs are our priority.  As part of our continuing mission to provide you with exceptional heart care, we have created designated Provider Care Teams.  These Care Teams include your primary Cardiologist (physician) and Advanced Practice Providers (APPs -  Physician Assistants and Nurse Practitioners) who all work together to provide you with the care you need, when you need it.   Your next appointment:   1 year(s)  The format for your next appointment:   In Person  Provider:   You may see James Allred, MD or one of the following Advanced Practice Providers on your designated Care Team:   Michael "Andy" Tillery, PA-C    

## 2020-06-18 NOTE — Addendum Note (Signed)
Addended by: Carylon Perches on: 06/18/2020 12:59 PM   Modules accepted: Orders

## 2020-06-19 DIAGNOSIS — Z7901 Long term (current) use of anticoagulants: Secondary | ICD-10-CM | POA: Diagnosis not present

## 2020-06-19 DIAGNOSIS — R1013 Epigastric pain: Secondary | ICD-10-CM | POA: Diagnosis not present

## 2020-06-19 DIAGNOSIS — K2950 Unspecified chronic gastritis without bleeding: Secondary | ICD-10-CM | POA: Diagnosis not present

## 2020-06-19 LAB — CBC
Hematocrit: 41.3 % (ref 37.5–51.0)
Hemoglobin: 14.5 g/dL (ref 13.0–17.7)
MCH: 34.6 pg — ABNORMAL HIGH (ref 26.6–33.0)
MCHC: 35.1 g/dL (ref 31.5–35.7)
MCV: 99 fL — ABNORMAL HIGH (ref 79–97)
Platelets: 198 10*3/uL (ref 150–450)
RBC: 4.19 x10E6/uL (ref 4.14–5.80)
RDW: 12.1 % (ref 11.6–15.4)
WBC: 6.5 10*3/uL (ref 3.4–10.8)

## 2020-06-19 LAB — BASIC METABOLIC PANEL
BUN/Creatinine Ratio: 18 (ref 10–24)
BUN: 16 mg/dL (ref 8–27)
CO2: 21 mmol/L (ref 20–29)
Calcium: 10 mg/dL (ref 8.6–10.2)
Chloride: 102 mmol/L (ref 96–106)
Creatinine, Ser: 0.89 mg/dL (ref 0.76–1.27)
Glucose: 96 mg/dL (ref 65–99)
Potassium: 4.6 mmol/L (ref 3.5–5.2)
Sodium: 141 mmol/L (ref 134–144)
eGFR: 90 mL/min/{1.73_m2} (ref >59)

## 2020-06-22 DIAGNOSIS — M25561 Pain in right knee: Secondary | ICD-10-CM | POA: Diagnosis not present

## 2020-06-26 DIAGNOSIS — J209 Acute bronchitis, unspecified: Secondary | ICD-10-CM | POA: Diagnosis not present

## 2020-06-26 DIAGNOSIS — J019 Acute sinusitis, unspecified: Secondary | ICD-10-CM | POA: Diagnosis not present

## 2020-08-10 ENCOUNTER — Ambulatory Visit: Payer: Self-pay | Admitting: Surgery

## 2020-08-10 DIAGNOSIS — Z7901 Long term (current) use of anticoagulants: Secondary | ICD-10-CM | POA: Diagnosis not present

## 2020-08-10 DIAGNOSIS — K8010 Calculus of gallbladder with chronic cholecystitis without obstruction: Secondary | ICD-10-CM | POA: Diagnosis not present

## 2020-08-10 DIAGNOSIS — I482 Chronic atrial fibrillation, unspecified: Secondary | ICD-10-CM | POA: Diagnosis not present

## 2020-08-10 NOTE — H&P (Signed)
Sarajane Jews Appointment: 08/10/2020 10:15 AM Location: [facility] Patient #: [id number] DOB: [birth year] Married / Language: Cleophus Molt / Race: White Male  History of Present Illness Adin Hector MD; 08/10/2020 5:07 PM) The patient is a 75 year old male who presents for evaluation of gall stones. Note for "Gall stones": ` ` ` Patient sent for surgical consultation at the request of Derinda Late  Chief Complaint: Abdominal pain and gallstones. ` ` The patient is a male history of atrial fibrillation on Xerelto. No history of stroke. Rides his bike 3 times a week. Followed by Dr. Rayann Heman with cardiology. His had some abdominal pain issues. He is noted attacks starting 22 January 2020. He'll feel nauseated with a lot of salivating and spitting up. Retching. Feel weak. Gets epigastric pain. Usually lasts for several hours. He does not know if it necessarily is triggered by food but has had an happen within a few hours of that. Did not really try any medications. Was concerned and sought gastroenterology. Had an EGD that was rather underwhelming. No improvement with medications. Gallbladder suspected. Ultrasound showed stones versus polyps. Surgical consultation offered. He does not smoke. No sleep apnea. No diabetes. Usually moves his bowels most days. Lives in [place]. Occasionally gets his care through wait for spelled wanted have a surgeon in [place] so sent my way. He is also a graduate from the same university is me, [employer], so he picked me  No personal nor family history of GI/colon cancer, inflammatory bowel disease, irritable bowel syndrome, allergy such as Celiac Sprue, dietary/dairy problems, colitis, ulcers nor gastritis. No recent sick contacts/gastroenteritis. No travel outside the country. No changes in diet. No dysphagia to solids or liquids. No significant heartburn or reflux. No melena, hematemesis, coffee ground emesis. No  evidence of prior gastric/peptic ulceration.  (Review of systems as stated in this history (HPI) or in the review of systems. Otherwise all other 12 point ROS are negative) ` ` ###########################################`  This patient encounter took 35 minutes today to perform the following: obtain history, perform exam, review outside records, interpret tests & imaging, counsel the patient on their diagnosis; and, document this encounter, including findings & plan in the electronic health record (EHR).   Past Surgical History Illene Regulus, CMA; 08/10/2020 10:28 AM) Knee Surgery Right. Prostate Surgery - Removal Tonsillectomy Vasectomy  Diagnostic Studies History Illene Regulus, CMA; 08/10/2020 10:28 AM) Colonoscopy 1-5 years ago  Allergies Lars Mage Spillers, CMA; 08/10/2020 10:29 AM) No Known Drug Allergies [08/10/2020]:  Medication History Illene Regulus, CMA; 08/10/2020 10:29 AM) Allopurinol (300MG  Tablet, Oral) Active. Xarelto (20MG  Tablet, Oral) Active. Rosuvastatin Calcium (40MG  Tablet, Oral) Active. Zolpidem Tartrate (10MG  Tablet, Oral) Active. Ezetimibe (10MG  Tablet, Oral) Active. Indomethacin (50MG  Capsule, Oral) Active. Medications Reconciled  Social History Illene Regulus, CMA; 08/10/2020 10:28 AM) Alcohol use Moderate alcohol use. Caffeine use Carbonated beverages, Coffee. No drug use Tobacco use Former smoker.  Family History Illene Regulus, [place]; 08/10/2020 10:28 AM) Alcohol Abuse Father.  Other Problems Illene Regulus, CMA; 08/10/2020 10:28 AM) Prostate Cancer Pulmonary Embolism / Blood Clot in Legs     Review of Systems (Alisha Spillers CMA; 08/10/2020 10:28 AM) General Not Present- Appetite Loss, Chills, Fatigue, Fever, Night Sweats, Weight Gain and Weight Loss. Skin Not Present- Change in Wart/Mole, Dryness, Hives, Jaundice, New Lesions, Non-Healing Wounds, Rash and Ulcer. HEENT Not Present- Earache, Hearing Loss,  Hoarseness, Nose Bleed, Oral Ulcers, Ringing in the Ears, Seasonal Allergies, Sinus Pain, Sore Throat, Visual Disturbances, Wears glasses/contact lenses and Yellow  Eyes. Respiratory Not Present- Bloody sputum, Chronic Cough, Difficulty Breathing, Snoring and Wheezing. Breast Not Present- Breast Mass, Breast Pain, Nipple Discharge and Skin Changes. Cardiovascular Not Present- Chest Pain, Difficulty Breathing Lying Down, Leg Cramps, Palpitations, Rapid Heart Rate, Shortness of Breath and Swelling of Extremities. Gastrointestinal Present- Abdominal Pain and Vomiting. Not Present- Bloating, Bloody Stool, Change in Bowel Habits, Chronic diarrhea, Constipation, Difficulty Swallowing, Excessive gas, Gets full quickly at meals, Hemorrhoids, Indigestion, Nausea and Rectal Pain. Male Genitourinary Not Present- Blood in Urine, Change in Urinary Stream, Frequency, Impotence, Nocturia, Painful Urination, Urgency and Urine Leakage. Musculoskeletal Not Present- Back Pain, Joint Pain, Joint Stiffness, Muscle Pain, Muscle Weakness and Swelling of Extremities. Neurological Not Present- Decreased Memory, Fainting, Headaches, Numbness, Seizures, Tingling, Tremor, Trouble walking and Weakness. Psychiatric Not Present- Anxiety, Bipolar, Change in Sleep Pattern, Depression, Fearful and Frequent crying. Endocrine Not Present- Cold Intolerance, Excessive Hunger, Hair Changes, Heat Intolerance, Hot flashes and New Diabetes. Hematology Not Present- Blood Thinners, Easy Bruising, Excessive bleeding, Gland problems, HIV and Persistent Infections.  Vitals (Alisha Spillers CMA; 08/10/2020 10:29 AM) 08/10/2020 10:28 AM Weight: 216.8 lb Height: 74in Body Surface Area: 2.25 m Body Mass Index: 27.84 kg/m  Pulse: 101 (Regular)  BP: 124/82(Sitting, Left Arm, Standard)        Physical Exam Adin Hector MD; 08/10/2020 5:07 PM)  General Mental Status-Alert. General Appearance-Not in acute distress, Not  Sickly. Orientation-Oriented X3. Hydration-Well hydrated. Voice-Normal.  Integumentary Global Assessment Upon inspection and palpation of skin surfaces of the - Axillae: non-tender, no inflammation or ulceration, no drainage. and Distribution of scalp and body hair is normal. General Characteristics Temperature - normal warmth is noted.  Head and Neck Head-normocephalic, atraumatic with no lesions or palpable masses. Face Global Assessment - atraumatic, no absence of expression. Neck Global Assessment - no abnormal movements, no bruit auscultated on the right, no bruit auscultated on the left, no decreased range of motion, non-tender. Trachea-midline. Thyroid Gland Characteristics - non-tender.  Eye Eyeball - Left-Extraocular movements intact, No Nystagmus - Left. Eyeball - Right-Extraocular movements intact, No Nystagmus - Right. Cornea - Left-No Hazy - Left. Cornea - Right-No Hazy - Right. Sclera/Conjunctiva - Left-No scleral icterus, No Discharge - Left. Sclera/Conjunctiva - Right-No scleral icterus, No Discharge - Right. Pupil - Left-Direct reaction to light normal. Pupil - Right-Direct reaction to light normal.  ENMT Ears Pinna - Left - no drainage observed, no generalized tenderness observed. Pinna - Right - no drainage observed, no generalized tenderness observed. Nose and Sinuses External Inspection of the Nose - no destructive lesion observed. Inspection of the nares - Left - quiet respiration. Inspection of the nares - Right - quiet respiration. Mouth and Throat Lips - Upper Lip - no fissures observed, no pallor noted. Lower Lip - no fissures observed, no pallor noted. Nasopharynx - no discharge present. Oral Cavity/Oropharynx - Tongue - no dryness observed. Oral Mucosa - no cyanosis observed. Hypopharynx - no evidence of airway distress observed.  Chest and Lung Exam Inspection Movements - Normal and Symmetrical. Accessory muscles - No use  of accessory muscles in breathing. Palpation Palpation of the chest reveals - Non-tender. Auscultation Breath sounds - Normal and Clear.  Cardiovascular Auscultation Rhythm - Regular. Murmurs & Other Heart Sounds - Auscultation of the heart reveals - No Murmurs and No Systolic Clicks.  Abdomen Inspection Inspection of the abdomen reveals - No Visible peristalsis and No Abnormal pulsations. Umbilicus - No Bleeding, No Urine drainage. Palpation/Percussion Palpation and Percussion of the abdomen reveal -  Soft, Non Tender, No Rebound tenderness, No Rigidity (guarding) and No Cutaneous hyperesthesia. Note: Abdomen soft. Nontender. Not distended. No umbilical or incisional hernias. No guarding.  Male Genitourinary Sexual Maturity Tanner 5 - Adult hair pattern and Adult penile size and shape.  Peripheral Vascular Upper Extremity Inspection - Left - No Cyanotic nailbeds - Left, Not Ischemic. Inspection - Right - No Cyanotic nailbeds - Right, Not Ischemic.  Neurologic Neurologic evaluation reveals -normal attention span and ability to concentrate, able to name objects and repeat phrases. Appropriate fund of knowledge , normal sensation and normal coordination. Mental Status Affect - not angry, not paranoid. Cranial Nerves-Normal Bilaterally. Gait-Normal.  Neuropsychiatric Mental status exam performed with findings of-able to articulate well with normal speech/language, rate, volume and coherence, thought content normal with ability to perform basic computations and apply abstract reasoning and no evidence of hallucinations, delusions, obsessions or homicidal/suicidal ideation.  Musculoskeletal Global Assessment Spine, Ribs and Pelvis - no instability, subluxation or laxity. Right Upper Extremity - no instability, subluxation or laxity.  Lymphatic Head & Neck  General Head & Neck Lymphatics: Bilateral - Description - No Localized lymphadenopathy. Axillary  General  Axillary Region: Bilateral - Description - No Localized lymphadenopathy. Femoral & Inguinal  Generalized Femoral & Inguinal Lymphatics: Left - Description - No Localized lymphadenopathy. Right - Description - No Localized lymphadenopathy.    Assessment & Plan Adin Hector MD; 08/10/2020 5:08 PM)  CHRONIC CHOLECYSTITIS WITH CALCULUS (K80.10) Impression: Episodic epigastric pain with nausea vomiting and hypersalivation most likely of biliary etiology. No classic fruit trigger is always spent rest of the discharge diagnosis seems underwhelming.  Given the fact that he seen gastroenterology was underwhelming EGD and good cardiac function, I'm skeptical of any other etiologies. Some chronic gastritis but has not tried any PPIs. I recommend he reconsider that and recheck his gastroenterologist. However I'm skeptical that his episodes are due to gastritis. He does have gallstones.  Her cholecystectomy since he keeps having attacks. I long discussion with him. He is interested in proceeding. He is hoping to get this done of the recovered for his trip to [place] in late June. Did caution there is a chance this does not solve all his problems or his major symptoms but I feel like we have done her due diligence to rule out other etiologies. He agrees.  Current Plans You are being scheduled for surgery- Our schedulers will call you.  You should hear from our office's scheduling department within 5 working days about the location, date, and time of surgery. We try to make accommodations for patient's preferences in scheduling surgery, but sometimes the OR schedule or the surgeon's schedule prevents [place] from making those accommodations.  If you have not heard from our office [phone]) in 5 working days, call the office and ask for your surgeon's nurse.  If you have other questions about your diagnosis, plan, or surgery, call the office and ask for your surgeon's nurse.  Written instructions  provided Pt Education - Pamphlet Given - Laparoscopic Gallbladder Surgery: discussed with patient and provided information. The anatomy & physiology of hepatobiliary & pancreatic function was discussed. The pathophysiology of gallbladder dysfunction was discussed. Natural history risks without surgery was discussed. I feel the risks of no intervention will lead to serious problems that outweigh the operative risks; therefore, I recommended cholecystectomy to remove the pathology. I explained laparoscopic techniques with possible need for an open approach. Probable cholangiogram to evaluate the bilary tract was explained as well.  Risks such  as bleeding, infection, abscess, leak, injury to other organs, need for further treatment, heart attack, death, and other risks were discussed. I noted a good likelihood this will help address the problem. Possibility that this will not correct all abdominal symptoms was explained. Goals of post-operative recovery were discussed as well. We will work to minimize complications. An educational handout further explaining the pathology and treatment options was given as well. Questions were answered. The patient expresses understanding & wishes to proceed with surgery.  Pt Education - [facility] Laparosopic Post Op HCI (Keeli Roberg) Pt Education - [facility] Good Bowel Health (Nataley Bahri) Pt Education - Laparoscopic Cholecystectomy: gallbladder  ANTICOAGULATED (Z79.01) Impression: On Xerelto for chronic atrial fibrillation. He denies gag good exercise tolerance, I'm pretty sure anesthesia would like cardiac clearance. Most likely they will feel comfortable holding Xerelto 2 days preop and resuming it a day or 2 afterwards if things go well. Like to double check that.  Current Plans I recommended obtaining preoperative cardiac clearance. I am concerned about the health of the patient and the ability to tolerate the operation. Therefore, we will request clearance by cardiology to  better assess operative risk & see if a reevaluation, further workup, etc is needed. Also recommendations on how medications such as for anticoagulation and blood pressure should be managed/held/restarted after surgery. Pt Education - [facility] Hold anticoagulation preoperatively  CHRONIC A-FIB (I48.20)  Adin Hector, MD, FACS, MASCRS  Esophageal, Gastrointestinal & Colorectal Surgery Robotic and Minimally Invasive Surgery [facility] [address], [place],  [address] [phone] Fax [phone] Main/Paging  CONTACT INFORMATION: Weekday (9AM-5PM) concerns: Call [facility] main office at [phone] Weeknight (5PM-9AM) or Weekend/Holiday concerns: Check [email or web address] for General Surgery [facility] coverage (Please, do not use SecureChat as it is not reliable communication to operating surgeons for immediate patient care)

## 2020-08-10 NOTE — H&P (View-Only) (Signed)
Zachary Sanchez Appointment: 08/10/2020 10:15 AM Location: [facility] Patient #: [id number] DOB: [birth year] Married / Language: Zachary Sanchez / Race: White Male  History of Present Illness Adin Hector MD; 08/10/2020 5:07 PM) The patient is a 75 year old male who presents for evaluation of gall stones. Note for "Gall stones": ` ` ` Patient sent for surgical consultation at the request of Derinda Late  Chief Complaint: Abdominal pain and gallstones. ` ` The patient is a male history of atrial fibrillation on Xerelto. No history of stroke. Rides his bike 3 times a week. Followed by Dr. Rayann Heman with cardiology. His had some abdominal pain issues. He is noted attacks starting 22 January 2020. He'll feel nauseated with a lot of salivating and spitting up. Retching. Feel weak. Gets epigastric pain. Usually lasts for several hours. He does not know if it necessarily is triggered by food but has had an happen within a few hours of that. Did not really try any medications. Was concerned and sought gastroenterology. Had an EGD that was rather underwhelming. No improvement with medications. Gallbladder suspected. Ultrasound showed stones versus polyps. Surgical consultation offered. He does not smoke. No sleep apnea. No diabetes. Usually moves his bowels most days. Lives in [place]. Occasionally gets his care through wait for spelled wanted have a surgeon in [place] so sent my way. He is also a graduate from the same university is me, [employer], so he picked me  No personal nor family history of GI/colon cancer, inflammatory bowel disease, irritable bowel syndrome, allergy such as Celiac Sprue, dietary/dairy problems, colitis, ulcers nor gastritis. No recent sick contacts/gastroenteritis. No travel outside the country. No changes in diet. No dysphagia to solids or liquids. No significant heartburn or reflux. No melena, hematemesis, coffee ground emesis. No  evidence of prior gastric/peptic ulceration.  (Review of systems as stated in this history (HPI) or in the review of systems. Otherwise all other 12 point ROS are negative) ` ` ###########################################`  This patient encounter took 35 minutes today to perform the following: obtain history, perform exam, review outside records, interpret tests & imaging, counsel the patient on their diagnosis; and, document this encounter, including findings & plan in the electronic health record (EHR).   Past Surgical History Illene Regulus, CMA; 08/10/2020 10:28 AM) Knee Surgery Right. Prostate Surgery - Removal Tonsillectomy Vasectomy  Diagnostic Studies History Illene Regulus, CMA; 08/10/2020 10:28 AM) Colonoscopy 1-5 years ago  Allergies Lars Mage Spillers, CMA; 08/10/2020 10:29 AM) No Known Drug Allergies [08/10/2020]:  Medication History Illene Regulus, CMA; 08/10/2020 10:29 AM) Allopurinol (300MG  Tablet, Oral) Active. Xarelto (20MG  Tablet, Oral) Active. Rosuvastatin Calcium (40MG  Tablet, Oral) Active. Zolpidem Tartrate (10MG  Tablet, Oral) Active. Ezetimibe (10MG  Tablet, Oral) Active. Indomethacin (50MG  Capsule, Oral) Active. Medications Reconciled  Social History Illene Regulus, CMA; 08/10/2020 10:28 AM) Alcohol use Moderate alcohol use. Caffeine use Carbonated beverages, Coffee. No drug use Tobacco use Former smoker.  Family History Illene Regulus, [place]; 08/10/2020 10:28 AM) Alcohol Abuse Father.  Other Problems Illene Regulus, CMA; 08/10/2020 10:28 AM) Prostate Cancer Pulmonary Embolism / Blood Clot in Legs     Review of Systems (Alisha Spillers CMA; 08/10/2020 10:28 AM) General Not Present- Appetite Loss, Chills, Fatigue, Fever, Night Sweats, Weight Gain and Weight Loss. Skin Not Present- Change in Wart/Mole, Dryness, Hives, Jaundice, New Lesions, Non-Healing Wounds, Rash and Ulcer. HEENT Not Present- Earache, Hearing Loss,  Hoarseness, Nose Bleed, Oral Ulcers, Ringing in the Ears, Seasonal Allergies, Sinus Pain, Sore Throat, Visual Disturbances, Wears glasses/contact lenses and Yellow  Eyes. Respiratory Not Present- Bloody sputum, Chronic Cough, Difficulty Breathing, Snoring and Wheezing. Breast Not Present- Breast Mass, Breast Pain, Nipple Discharge and Skin Changes. Cardiovascular Not Present- Chest Pain, Difficulty Breathing Lying Down, Leg Cramps, Palpitations, Rapid Heart Rate, Shortness of Breath and Swelling of Extremities. Gastrointestinal Present- Abdominal Pain and Vomiting. Not Present- Bloating, Bloody Stool, Change in Bowel Habits, Chronic diarrhea, Constipation, Difficulty Swallowing, Excessive gas, Gets full quickly at meals, Hemorrhoids, Indigestion, Nausea and Rectal Pain. Male Genitourinary Not Present- Blood in Urine, Change in Urinary Stream, Frequency, Impotence, Nocturia, Painful Urination, Urgency and Urine Leakage. Musculoskeletal Not Present- Back Pain, Joint Pain, Joint Stiffness, Muscle Pain, Muscle Weakness and Swelling of Extremities. Neurological Not Present- Decreased Memory, Fainting, Headaches, Numbness, Seizures, Tingling, Tremor, Trouble walking and Weakness. Psychiatric Not Present- Anxiety, Bipolar, Change in Sleep Pattern, Depression, Fearful and Frequent crying. Endocrine Not Present- Cold Intolerance, Excessive Hunger, Hair Changes, Heat Intolerance, Hot flashes and New Diabetes. Hematology Not Present- Blood Thinners, Easy Bruising, Excessive bleeding, Gland problems, HIV and Persistent Infections.  Vitals (Alisha Spillers CMA; 08/10/2020 10:29 AM) 08/10/2020 10:28 AM Weight: 216.8 lb Height: 74in Body Surface Area: 2.25 m Body Mass Index: 27.84 kg/m  Pulse: 101 (Regular)  BP: 124/82(Sitting, Left Arm, Standard)        Physical Exam Adin Hector MD; 08/10/2020 5:07 PM)  General Mental Status-Alert. General Appearance-Not in acute distress, Not  Sickly. Orientation-Oriented X3. Hydration-Well hydrated. Voice-Normal.  Integumentary Global Assessment Upon inspection and palpation of skin surfaces of the - Axillae: non-tender, no inflammation or ulceration, no drainage. and Distribution of scalp and body hair is normal. General Characteristics Temperature - normal warmth is noted.  Head and Neck Head-normocephalic, atraumatic with no lesions or palpable masses. Face Global Assessment - atraumatic, no absence of expression. Neck Global Assessment - no abnormal movements, no bruit auscultated on the right, no bruit auscultated on the left, no decreased range of motion, non-tender. Trachea-midline. Thyroid Gland Characteristics - non-tender.  Eye Eyeball - Left-Extraocular movements intact, No Nystagmus - Left. Eyeball - Right-Extraocular movements intact, No Nystagmus - Right. Cornea - Left-No Hazy - Left. Cornea - Right-No Hazy - Right. Sclera/Conjunctiva - Left-No scleral icterus, No Discharge - Left. Sclera/Conjunctiva - Right-No scleral icterus, No Discharge - Right. Pupil - Left-Direct reaction to light normal. Pupil - Right-Direct reaction to light normal.  ENMT Ears Pinna - Left - no drainage observed, no generalized tenderness observed. Pinna - Right - no drainage observed, no generalized tenderness observed. Nose and Sinuses External Inspection of the Nose - no destructive lesion observed. Inspection of the nares - Left - quiet respiration. Inspection of the nares - Right - quiet respiration. Mouth and Throat Lips - Upper Lip - no fissures observed, no pallor noted. Lower Lip - no fissures observed, no pallor noted. Nasopharynx - no discharge present. Oral Cavity/Oropharynx - Tongue - no dryness observed. Oral Mucosa - no cyanosis observed. Hypopharynx - no evidence of airway distress observed.  Chest and Lung Exam Inspection Movements - Normal and Symmetrical. Accessory muscles - No use  of accessory muscles in breathing. Palpation Palpation of the chest reveals - Non-tender. Auscultation Breath sounds - Normal and Clear.  Cardiovascular Auscultation Rhythm - Regular. Murmurs & Other Heart Sounds - Auscultation of the heart reveals - No Murmurs and No Systolic Clicks.  Abdomen Inspection Inspection of the abdomen reveals - No Visible peristalsis and No Abnormal pulsations. Umbilicus - No Bleeding, No Urine drainage. Palpation/Percussion Palpation and Percussion of the abdomen reveal -  Soft, Non Tender, No Rebound tenderness, No Rigidity (guarding) and No Cutaneous hyperesthesia. Note: Abdomen soft. Nontender. Not distended. No umbilical or incisional hernias. No guarding.  Male Genitourinary Sexual Maturity Tanner 5 - Adult hair pattern and Adult penile size and shape.  Peripheral Vascular Upper Extremity Inspection - Left - No Cyanotic nailbeds - Left, Not Ischemic. Inspection - Right - No Cyanotic nailbeds - Right, Not Ischemic.  Neurologic Neurologic evaluation reveals -normal attention span and ability to concentrate, able to name objects and repeat phrases. Appropriate fund of knowledge , normal sensation and normal coordination. Mental Status Affect - not angry, not paranoid. Cranial Nerves-Normal Bilaterally. Gait-Normal.  Neuropsychiatric Mental status exam performed with findings of-able to articulate well with normal speech/language, rate, volume and coherence, thought content normal with ability to perform basic computations and apply abstract reasoning and no evidence of hallucinations, delusions, obsessions or homicidal/suicidal ideation.  Musculoskeletal Global Assessment Spine, Ribs and Pelvis - no instability, subluxation or laxity. Right Upper Extremity - no instability, subluxation or laxity.  Lymphatic Head & Neck  General Head & Neck Lymphatics: Bilateral - Description - No Localized lymphadenopathy. Axillary  General  Axillary Region: Bilateral - Description - No Localized lymphadenopathy. Femoral & Inguinal  Generalized Femoral & Inguinal Lymphatics: Left - Description - No Localized lymphadenopathy. Right - Description - No Localized lymphadenopathy.    Assessment & Plan Adin Hector MD; 08/10/2020 5:08 PM)  CHRONIC CHOLECYSTITIS WITH CALCULUS (K80.10) Impression: Episodic epigastric pain with nausea vomiting and hypersalivation most likely of biliary etiology. No classic fruit trigger is always spent rest of the discharge diagnosis seems underwhelming.  Given the fact that he seen gastroenterology was underwhelming EGD and good cardiac function, I'm skeptical of any other etiologies. Some chronic gastritis but has not tried any PPIs. I recommend he reconsider that and recheck his gastroenterologist. However I'm skeptical that his episodes are due to gastritis. He does have gallstones.  Her cholecystectomy since he keeps having attacks. I long discussion with him. He is interested in proceeding. He is hoping to get this done of the recovered for his trip to [place] in late June. Did caution there is a chance this does not solve all his problems or his major symptoms but I feel like we have done her due diligence to rule out other etiologies. He agrees.  Current Plans You are being scheduled for surgery- Our schedulers will call you.  You should hear from our office's scheduling department within 5 working days about the location, date, and time of surgery. We try to make accommodations for patient's preferences in scheduling surgery, but sometimes the OR schedule or the surgeon's schedule prevents [place] from making those accommodations.  If you have not heard from our office [phone]) in 5 working days, call the office and ask for your surgeon's nurse.  If you have other questions about your diagnosis, plan, or surgery, call the office and ask for your surgeon's nurse.  Written instructions  provided Pt Education - Pamphlet Given - Laparoscopic Gallbladder Surgery: discussed with patient and provided information. The anatomy & physiology of hepatobiliary & pancreatic function was discussed. The pathophysiology of gallbladder dysfunction was discussed. Natural history risks without surgery was discussed. I feel the risks of no intervention will lead to serious problems that outweigh the operative risks; therefore, I recommended cholecystectomy to remove the pathology. I explained laparoscopic techniques with possible need for an open approach. Probable cholangiogram to evaluate the bilary tract was explained as well.  Risks such  as bleeding, infection, abscess, leak, injury to other organs, need for further treatment, heart attack, death, and other risks were discussed. I noted a good likelihood this will help address the problem. Possibility that this will not correct all abdominal symptoms was explained. Goals of post-operative recovery were discussed as well. We will work to minimize complications. An educational handout further explaining the pathology and treatment options was given as well. Questions were answered. The patient expresses understanding & wishes to proceed with surgery.  Pt Education - [facility] Laparosopic Post Op HCI (Asyah Candler) Pt Education - [facility] Good Bowel Health (Devany Aja) Pt Education - Laparoscopic Cholecystectomy: gallbladder  ANTICOAGULATED (Z79.01) Impression: On Xerelto for chronic atrial fibrillation. He denies gag good exercise tolerance, I'm pretty sure anesthesia would like cardiac clearance. Most likely they will feel comfortable holding Xerelto 2 days preop and resuming it a day or 2 afterwards if things go well. Like to double check that.  Current Plans I recommended obtaining preoperative cardiac clearance. I am concerned about the health of the patient and the ability to tolerate the operation. Therefore, we will request clearance by cardiology to  better assess operative risk & see if a reevaluation, further workup, etc is needed. Also recommendations on how medications such as for anticoagulation and blood pressure should be managed/held/restarted after surgery. Pt Education - [facility] Hold anticoagulation preoperatively  CHRONIC A-FIB (I48.20)  Adin Hector, MD, FACS, MASCRS  Esophageal, Gastrointestinal & Colorectal Surgery Robotic and Minimally Invasive Surgery [facility] [address], [place],  [address] [phone] Fax [phone] Main/Paging  CONTACT INFORMATION: Weekday (9AM-5PM) concerns: Call [facility] main office at [phone] Weeknight (5PM-9AM) or Weekend/Holiday concerns: Check [email or web address] for General Surgery [facility] coverage (Please, do not use SecureChat as it is not reliable communication to operating surgeons for immediate patient care)

## 2020-08-11 ENCOUNTER — Encounter: Payer: Self-pay | Admitting: Student

## 2020-08-11 NOTE — Progress Notes (Signed)
   I previously saw Zachary Sanchez 06/18/20 for his permanent atrial fibrillation.  He denied symptoms of chest pain, dyspnea, or palpitations or other symptoms concerning for a worsening cardiac state.   Received word that he will need cardiac clearance for upcoming surgery "Laparoscopic Gallbladder Surgery".   I called Zachary Sanchez who states he has had no change in his symptoms since our visit and is otherwise doing well. He had an Echo in 2018 with normal EF and his heart rates have been well controlled in atrial fibrillation. I do not suspect there would have been significant change.   He is at a "low risk" of peri-operative complications from a cardiac perspective by the Revised Cardiac Risk Index [name] Criteria) and is OK to proceed with Laparoscopic Gallbladder Surgery without further cardiac work up.    He understands there is always some risk of coming off of blood thinner for someone who is in atrial fibrillation, but this is not a prohibitive amount and is agreeable to proceed with surgery.   Pre-op will address holding [place] per office protocols. Would resume as soon as safe to do so post op.   Please call our office with any questions or concerns.   [name] [address] [place], [place]  08/11/2020 8:39 AM

## 2020-08-12 ENCOUNTER — Telehealth: Payer: Self-pay | Admitting: *Deleted

## 2020-08-12 NOTE — Telephone Encounter (Signed)
   [facility] Pre-operative Risk Assessment    Patient Name: ASANI MCBURNEY  DOB: [birth year]  MRN: [id number]   HEARTCARE STAFF: - Please ensure there is not already an duplicate clearance open for this procedure. - Under Visit Info/Reason for Call, type in Other and utilize the format Clearance MM/DD/YY or Clearance TBD. Do not use dashes or single digits. - If request is for dental extraction, please clarify the # of teeth to be extracted.  Request for surgical clearance:  1. What type of surgery is being performed? Lap chole w/IOC   2. When is this surgery scheduled? TBD   3. What type of clearance is required (medical clearance vs. Pharmacy clearance to hold med vs. Both)? Both  4. Are there any medications that need to be held prior to surgery and how long?Xarelto listed on clearance form, current med list also has Eliquis listed   5. Practice name and name of physician performing surgery? [facility], Michael Boston, MD   6. What is the office phone number? [phone]    7.   What is the office fax number? [phone], AttnIllene Regulus, CMA  8.   Anesthesia type (None, local, MAC, general) ? General   Rahn Lacuesta L 08/12/2020, 12:49 PM  _________________________________________________________________   (provider comments below)

## 2020-08-12 NOTE — Telephone Encounter (Signed)
Patient with diagnosis of afib on Xarelto for anticoagulation.    Procedure: Lap chole w/IOC  Date of procedure: TBD  Of note patient had a single DVT/PE post knee surgery (questionable if should count as vascular disease in score since it was provoked)  CHA2DS2-VASc Score = 3  This indicates a 3.2% annual risk of stroke. The patient's score is based upon: CHF History: No HTN History: Yes Diabetes History: No Stroke History: No Vascular Disease History: Yes Age Score: 1 Gender Score: 0     CrCl 84.6 ml/min Platelet count 198  Per office protocol, patient can hold Xarelto for 2 days prior to procedure.

## 2020-08-12 NOTE — Telephone Encounter (Signed)
Patient is on Xarelto for permanent atrial fibrillation

## 2020-08-13 NOTE — Telephone Encounter (Signed)
Primary Cardiologist:No primary care provider on file.  Chart reviewed as part of pre-operative protocol coverage. Because of Zachary Sanchez past medical history and time since last visit, he/she will require a follow-up visit in order to better assess preoperative cardiovascular risk.  Pre-op covering staff: - Please schedule appointment and call patient to inform them. - Please contact requesting surgeon's office via preferred method (i.e, phone, fax) to inform them of need for appointment prior to surgery.  If applicable, this message will also be routed to pharmacy pool and/or primary cardiologist for input on holding anticoagulant/antiplatelet agent as requested below so that this information is available at time of patient's appointment.   Deberah Pelton, NP  08/13/2020, 8:45 AM

## 2020-08-13 NOTE — Telephone Encounter (Signed)
   Primary Cardiologist: Thompson Grayer, MD  Chart reviewed as part of pre-operative protocol coverage. Given past medical history and time since last visit, based on ACC/AHA guidelines, Zachary Sanchez would be at acceptable risk for the planned procedure without further cardiovascular testing.   Patient with diagnosis of afib on Xarelto for anticoagulation.    Procedure: Lap chole w/IOC Date of procedure: TBD  Of note patient had a single DVT/PE post knee surgery (questionable if should count as vascular disease in score since it was provoked)  CHA2DS2-VASc Score = 3  This indicates a 3.2% annual risk of stroke. The patient's score is based upon: CHF History: No HTN History: Yes Diabetes History: No Stroke History: No Vascular Disease History: Yes Age Score: 1 Gender Score: 0     CrCl 84.6 ml/min Platelet count 198  Per office protocol, patient can hold Xarelto for 2 days prior to procedure.   I will route this recommendation to the requesting party via Epic fax function and remove from pre-op pool.  Please call with questions.  Zachary Sanchez. Zachary Garin NP-C    08/13/2020, 10:36 AM Zachary Sanchez [address] Office [phone] Fax [phone]

## 2020-08-27 NOTE — Progress Notes (Addendum)
PCP - Dr.Peter Blomgren Cardiologist - Clearance Coletta Memos, NP 08-13-20 epic  PPM/ICD -  Device Orders -  Rep Notified -   Chest x-ray -  EKG - 06-18-20 epic Stress Test -  ECHO - 2018 Cardiac Cath -   Sleep Study -  CPAP -   Fasting Blood Sugar -  Checks Blood Sugar _____ times a day  Blood Thinner Instructions: Xarelto hold 2 days prior Aspirin Instructions:  ERAS Protcol - PRE-SURGERY Ensure or G2-   COVID TEST- Ambulatory surgery  Activity--Able to walk a flight of stairs without SOB  Anesthesia review: PE, DVT, A-fib  Patient denies shortness of breath, fever, cough and chest pain at PAT appointment   All instructions explained to the patient, with a verbal understanding of the material. Patient agrees to go over the instructions while at home for a better understanding. Patient also instructed to self quarantine after being tested for COVID-19. The opportunity to ask questions was provided.

## 2020-08-27 NOTE — Patient Instructions (Signed)
DUE TO COVID-19 ONLY ONE VISITOR IS ALLOWED TO COME WITH YOU AND STAY IN THE WAITING ROOM ONLY DURING PRE OP AND PROCEDURE DAY OF SURGERY.   TWO VISITOR  MAY VISIT WITH YOU AFTER SURGERY IN YOUR PRIVATE ROOM DURING VISITING HOURS ONLY!  YOU NEED TO HAVE A COVID 19 TEST ON_______ @_______ , THIS TEST MUST BE DONE BEFORE SURGERY,  COVID TESTING SITE [address] [place] [address], IT IS ON THE RIGHT GOING OUT [address] APPROXIMATELY  2 MINUTES PAST [employer] ON THE RIGHT. ONCE YOUR COVID TEST IS COMPLETED,  PLEASE BEGIN THE QUARANTINE INSTRUCTIONS AS OUTLINED IN YOUR HANDOUT.                Zachary Sanchez  08/27/2020   Your procedure is scheduled on: 09-04-20   Report to [facility] Main  Entrance   Report to Short Stay at       [place] AM     Call this number if you have problems the morning of surgery [phone]    Remember: NO SOLID FOOD AFTER MIDNIGHT THE NIGHT PRIOR TO SURGERY. NOTHING BY MOUTH EXCEPT CLEAR LIQUIDS UNTIL      0430 am then nothing by mouth .   PLEASE FINISH ENSURE DRINK PER SURGEON ORDER  WHICH NEEDS TO BE COMPLETED AT       0430 am.    CLEAR LIQUID DIET                                                                                                                          Foods Excluded Water Black Coffee and tea, regular and decaf                                                 liquids that you cannot  Plain Jell-O any favor except red or purple                                           see through such as: Fruit ices (not with fruit pulp)                                                                           milk, soups, orange juice  Iced Popsicles  All solid food Carbonated beverages, regular and diet                                    Cranberry, grape and apple juices Sports drinks like Gatorade Lightly seasoned clear broth or consume(fat  free) Sugar, honey syrup  _____________________________________________________________________     BRUSH YOUR TEETH MORNING OF SURGERY AND RINSE YOUR MOUTH OUT, NO CHEWING GUM CANDY OR MINTS.     Take these medicines the morning of surgery with A SIP OF WATER: allopurinol, zetia, crestor                                 You may not have any metal on your body including hair pins and              piercings  Do not wear jewelry, make-up, lotions, powders or perfumes, deodorant                        Men may shave face and neck.   Do not bring valuables to the hospital. [place].  Contacts, dentures or bridgework may not be worn into surgery.      Patients discharged the day of surgery will not be allowed to drive home. IF YOU ARE HAVING SURGERY AND GOING HOME THE SAME DAY, YOU MUST HAVE AN ADULT TO DRIVE YOU HOME AND BE WITH YOU FOR 24 HOURS. YOU MAY GO HOME BY TAXI OR UBER OR ORTHERWISE, BUT AN ADULT MUST ACCOMPANY YOU HOME AND STAY WITH YOU FOR 24 HOURS.  Name and phone number of your driver:  Special Instructions: N/A              Please read over the following fact sheets you were given: _____________________________________________________________________             [facility] - Preparing for Surgery Before surgery, you can play an important role.  Because skin is not sterile, your skin needs to be as free of germs as possible.  You can reduce the number of germs on your skin by washing with CHG (chlorahexidine gluconate) soap before surgery.  CHG is an antiseptic cleaner which kills germs and bonds with the skin to continue killing germs even after washing. Please DO NOT use if you have an allergy to CHG or antibacterial soaps.  If your skin becomes reddened/irritated stop using the CHG and inform your nurse when you arrive at Short Stay. Do not shave (including legs and underarms) for at least 48 hours prior to the first  CHG shower.  You may shave your face/neck. Please follow these instructions carefully:  1.  Shower with CHG Soap the night before surgery and the  morning of Surgery.  2.  If you choose to wash your hair, wash your hair first as usual with your  normal  shampoo.  3.  After you shampoo, rinse your hair and body thoroughly to remove the  shampoo.                           4.  Use CHG as you would any other liquid soap.  You can apply chg directly  to the  skin and wash                       Gently with a scrungie or clean washcloth.  5.  Apply the CHG Soap to your body ONLY FROM THE NECK DOWN.   Do not use on face/ open                           Wound or open sores. Avoid contact with eyes, ears mouth and genitals (private parts).                       Wash face,  Genitals (private parts) with your normal soap.             6.  Wash thoroughly, paying special attention to the area where your surgery  will be performed.  7.  Thoroughly rinse your body with warm water from the neck down.  8.  DO NOT shower/wash with your normal soap after using and rinsing off  the CHG Soap.                9.  Pat yourself dry with a clean towel.            10.  Wear clean pajamas.            11.  Place clean sheets on your bed the night of your first shower and do not  sleep with pets. Day of Surgery : Do not apply any lotions/deodorants the morning of surgery.  Please wear clean clothes to the hospital/surgery center.  FAILURE TO FOLLOW THESE INSTRUCTIONS MAY RESULT IN THE CANCELLATION OF YOUR SURGERY PATIENT SIGNATURE_________________________________  NURSE SIGNATURE__________________________________  ________________________________________________________________________

## 2020-08-31 ENCOUNTER — Encounter (HOSPITAL_COMMUNITY): Payer: Self-pay

## 2020-08-31 ENCOUNTER — Other Ambulatory Visit: Payer: Self-pay

## 2020-08-31 ENCOUNTER — Encounter (HOSPITAL_COMMUNITY)
Admission: RE | Admit: 2020-08-31 | Discharge: 2020-08-31 | Disposition: A | Discharge status: Home / Self Care | Payer: PPO | Source: Ambulatory Visit | Attending: Surgery | Admitting: Surgery

## 2020-08-31 DIAGNOSIS — Z01812 Encounter for preprocedural laboratory examination: Secondary | ICD-10-CM | POA: Diagnosis not present

## 2020-08-31 HISTORY — DX: Pneumonia, unspecified organism: J18.9

## 2020-08-31 LAB — CBC
HCT: 41.9 % (ref 39.0–52.0)
Hemoglobin: 14.1 g/dL (ref 13.0–17.0)
MCH: 34.5 pg — ABNORMAL HIGH (ref 26.0–34.0)
MCHC: 33.7 g/dL (ref 30.0–36.0)
MCV: 102.4 fL — ABNORMAL HIGH (ref 80.0–100.0)
Platelets: 194 10*3/uL (ref 150–400)
RBC: 4.09 MIL/uL — ABNORMAL LOW (ref 4.22–5.81)
RDW: 13.2 % (ref 11.5–15.5)
WBC: 4.8 10*3/uL (ref 4.0–10.5)
nRBC: 0 % (ref 0.0–0.2)

## 2020-08-31 LAB — BASIC METABOLIC PANEL
Anion gap: 6 (ref 5–15)
BUN: 17 mg/dL (ref 8–23)
CO2: 26 mmol/L (ref 22–32)
Calcium: 9.8 mg/dL (ref 8.9–10.3)
Chloride: 106 mmol/L (ref 98–111)
Creatinine, Ser: 0.71 mg/dL (ref 0.61–1.24)
GFR, Estimated: >60 mL/min (ref >60)
Glucose, Bld: 109 mg/dL — ABNORMAL HIGH (ref 70–99)
Potassium: 4.5 mmol/L (ref 3.5–5.1)
Sodium: 138 mmol/L (ref 135–145)

## 2020-09-01 ENCOUNTER — Other Ambulatory Visit (HOSPITAL_COMMUNITY): Payer: PPO

## 2020-09-02 NOTE — Anesthesia Preprocedure Evaluation (Addendum)
Anesthesia Evaluation  Patient identified by MRN, date of birth, ID band Patient awake    Reviewed: Allergy & Precautions, NPO status , Patient's Chart, lab work & pertinent test results  Airway Mallampati: I  TM Distance: >3 FB Neck ROM: Full    Dental no notable dental hx. (+) Teeth Intact, Dental Advisory Given   Pulmonary former smoker, PE (in setting of post op)   Pulmonary exam normal breath sounds clear to auscultation       Cardiovascular + DVT  Normal cardiovascular exam+ dysrhythmias Atrial Fibrillation  Rhythm:Regular Rate:Normal  TTE 2018 - Left ventricle: The cavity size was normal. Wall thickness was  normal. Systolic function was normal. The estimated ejection  fraction was in the range of 55% to 60%. Wall motion was normal;  there were no regional wall motion abnormalities.  - Mitral valve: There was mild regurgitation.  - Left atrium: The atrium was moderately to severely dilated.    Neuro/Psych negative neurological ROS  negative psych ROS   GI/Hepatic negative GI ROS, Neg liver ROS,   Endo/Other  negative endocrine ROS  Renal/GU negative Renal ROS  negative genitourinary   Musculoskeletal  (+) Arthritis ,   Abdominal   Peds  Hematology  (+) Blood dyscrasia (on xarelto), ,   Anesthesia Other Findings   Reproductive/Obstetrics                           Anesthesia Physical Anesthesia Plan  ASA: III  Anesthesia Plan: General   Post-op Pain Management:    Induction: Intravenous  PONV Risk Score and Plan: 2 and Dexamethasone, Ondansetron and Treatment may vary due to age or medical condition  Airway Management Planned: Oral ETT  Additional Equipment:   Intra-op Plan:   Post-operative Plan: Extubation in OR  Informed Consent: I have reviewed the patients History and Physical, chart, labs and discussed the procedure including the risks, benefits and  alternatives for the proposed anesthesia with the patient or authorized representative who has indicated his/her understanding and acceptance.     Dental advisory given  Plan Discussed with: CRNA  Anesthesia Plan Comments: (Per cardiology 08/13/20,  "Primary Cardiologist: Thompson Grayer, MD  Chart reviewed as part of pre-operative protocol coverage. Given past medical history and time since last visit, based on ACC/AHA guidelines, HAU SANOR would be at acceptable risk for the planned procedure without further cardiovascular testing.   Patient with diagnosis ofafibon Xareltofor anticoagulation.   Procedure:Lap chole w/IOC Date of procedure:TBD  Of note patient had a single DVT/PE post knee surgery (questionable if should count as vascular disease in score since it was provoked)  CHA2DS2-VAScScore = 3 This indicates a3.2% annual risk of stroke. The patient's score is based upon: CHF History: No HTN History: Yes Diabetes History: No Stroke History: No Vascular Disease History: Yes Age Score: 1 Gender Score: 0   CrCl84.6 ml/min Platelet [phone]  Per office protocol, patient can holdXareltofor 2days prior to procedure. )       Anesthesia Quick Evaluation

## 2020-09-03 MED ORDER — BUPIVACAINE LIPOSOME 1.3 % IJ SUSP
20.0000 mL | Freq: Once | INTRAMUSCULAR | Status: DC
  Filled 2020-09-03: qty 20

## 2020-09-04 ENCOUNTER — Ambulatory Visit (HOSPITAL_COMMUNITY): Payer: PPO

## 2020-09-04 ENCOUNTER — Encounter (HOSPITAL_COMMUNITY): Payer: Self-pay | Admitting: Surgery

## 2020-09-04 ENCOUNTER — Ambulatory Visit (HOSPITAL_COMMUNITY): Payer: PPO | Admitting: Anesthesiology

## 2020-09-04 ENCOUNTER — Encounter (HOSPITAL_COMMUNITY)
Admission: RE | Disposition: A | Discharge status: Home / Self Care | Payer: Self-pay | Source: Home / Self Care | Attending: Surgery

## 2020-09-04 ENCOUNTER — Ambulatory Visit (HOSPITAL_COMMUNITY): Payer: PPO | Admitting: Physician Assistant

## 2020-09-04 ENCOUNTER — Ambulatory Visit (HOSPITAL_COMMUNITY)
Admission: RE | Admit: 2020-09-04 | Discharge: 2020-09-04 | Disposition: A | Discharge status: Home / Self Care | Payer: PPO | Attending: Surgery | Admitting: Surgery

## 2020-09-04 DIAGNOSIS — Z96651 Presence of right artificial knee joint: Secondary | ICD-10-CM | POA: Insufficient documentation

## 2020-09-04 DIAGNOSIS — K8020 Calculus of gallbladder without cholecystitis without obstruction: Secondary | ICD-10-CM | POA: Diagnosis not present

## 2020-09-04 DIAGNOSIS — Z86711 Personal history of pulmonary embolism: Secondary | ICD-10-CM | POA: Diagnosis not present

## 2020-09-04 DIAGNOSIS — Z8546 Personal history of malignant neoplasm of prostate: Secondary | ICD-10-CM | POA: Insufficient documentation

## 2020-09-04 DIAGNOSIS — K828 Other specified diseases of gallbladder: Secondary | ICD-10-CM | POA: Diagnosis not present

## 2020-09-04 DIAGNOSIS — Z79899 Other long term (current) drug therapy: Secondary | ICD-10-CM | POA: Insufficient documentation

## 2020-09-04 DIAGNOSIS — Z7901 Long term (current) use of anticoagulants: Secondary | ICD-10-CM | POA: Insufficient documentation

## 2020-09-04 DIAGNOSIS — I482 Chronic atrial fibrillation, unspecified: Secondary | ICD-10-CM | POA: Insufficient documentation

## 2020-09-04 DIAGNOSIS — K811 Chronic cholecystitis: Secondary | ICD-10-CM

## 2020-09-04 DIAGNOSIS — E871 Hypo-osmolality and hyponatremia: Secondary | ICD-10-CM | POA: Diagnosis not present

## 2020-09-04 DIAGNOSIS — I4821 Permanent atrial fibrillation: Secondary | ICD-10-CM | POA: Diagnosis not present

## 2020-09-04 DIAGNOSIS — K8010 Calculus of gallbladder with chronic cholecystitis without obstruction: Secondary | ICD-10-CM | POA: Diagnosis not present

## 2020-09-04 DIAGNOSIS — Z9049 Acquired absence of other specified parts of digestive tract: Secondary | ICD-10-CM | POA: Diagnosis not present

## 2020-09-04 DIAGNOSIS — Z86718 Personal history of other venous thrombosis and embolism: Secondary | ICD-10-CM | POA: Diagnosis not present

## 2020-09-04 DIAGNOSIS — E785 Hyperlipidemia, unspecified: Secondary | ICD-10-CM | POA: Diagnosis not present

## 2020-09-04 DIAGNOSIS — Z87891 Personal history of nicotine dependence: Secondary | ICD-10-CM | POA: Diagnosis not present

## 2020-09-04 HISTORY — PX: LAPAROSCOPIC CHOLECYSTECTOMY SINGLE SITE WITH INTRAOPERATIVE CHOLANGIOGRAM: SHX6538

## 2020-09-04 SURGERY — LAPAROSCOPIC CHOLECYSTECTOMY SINGLE SITE WITH INTRAOPERATIVE CHOLANGIOGRAM
Anesthesia: General | Site: Abdomen

## 2020-09-04 MED ORDER — ACETAMINOPHEN 500 MG PO TABS: 1000.0000 mg | ORAL_TABLET | Freq: Once | ORAL | Status: DC

## 2020-09-04 MED ORDER — BUPIVACAINE-EPINEPHRINE (PF) 0.25% -1:200000 IJ SOLN
INTRAMUSCULAR | Status: AC
  Filled 2020-09-04: qty 30

## 2020-09-04 MED ORDER — OXYCODONE HCL 5 MG PO TABS
ORAL_TABLET | ORAL | Status: AC
  Filled 2020-09-04: qty 1

## 2020-09-04 MED ORDER — FENTANYL CITRATE (PF) 100 MCG/2ML IJ SOLN
INTRAMUSCULAR | Status: AC
  Filled 2020-09-04: qty 2

## 2020-09-04 MED ORDER — BUPIVACAINE LIPOSOME 1.3 % IJ SUSP
INTRAMUSCULAR | Status: DC | PRN
  Administered 2020-09-04: 20 mL

## 2020-09-04 MED ORDER — KETAMINE HCL 10 MG/ML IJ SOLN
INTRAMUSCULAR | Status: AC
  Filled 2020-09-04: qty 1

## 2020-09-04 MED ORDER — PHENYLEPHRINE 40 MCG/ML (10ML) SYRINGE FOR IV PUSH (FOR BLOOD PRESSURE SUPPORT)
PREFILLED_SYRINGE | INTRAVENOUS | Status: AC
  Filled 2020-09-04: qty 10

## 2020-09-04 MED ORDER — GABAPENTIN 300 MG PO CAPS
300.0000 mg | ORAL_CAPSULE | ORAL | Status: AC
  Administered 2020-09-04: 300 mg via ORAL
  Filled 2020-09-04: qty 1

## 2020-09-04 MED ORDER — 0.9 % SODIUM CHLORIDE (POUR BTL) OPTIME
TOPICAL | Status: DC | PRN
  Administered 2020-09-04: 1000 mL

## 2020-09-04 MED ORDER — MIDAZOLAM HCL 2 MG/2ML IJ SOLN
INTRAMUSCULAR | Status: AC
  Filled 2020-09-04: qty 2

## 2020-09-04 MED ORDER — FENTANYL CITRATE (PF) 250 MCG/5ML IJ SOLN
INTRAMUSCULAR | Status: DC | PRN
  Administered 2020-09-04 (×4): 50 ug via INTRAVENOUS

## 2020-09-04 MED ORDER — MIDAZOLAM HCL 5 MG/5ML IJ SOLN
INTRAMUSCULAR | Status: DC | PRN
  Administered 2020-09-04: 2 mg via INTRAVENOUS

## 2020-09-04 MED ORDER — TRAMADOL HCL 50 MG PO TABS: 50.0000 mg | ORAL_TABLET | Freq: Four times a day (QID) | ORAL | 0 refills | Status: DC | PRN

## 2020-09-04 MED ORDER — LIDOCAINE 2% (20 MG/ML) 5 ML SYRINGE
INTRAMUSCULAR | Status: AC
  Filled 2020-09-04: qty 5

## 2020-09-04 MED ORDER — CHLORHEXIDINE GLUCONATE CLOTH 2 % EX PADS: 6.0000 | MEDICATED_PAD | Freq: Once | CUTANEOUS | Status: DC

## 2020-09-04 MED ORDER — SODIUM CHLORIDE (PF) 0.9 % IJ SOLN
INTRAMUSCULAR | Status: DC | PRN
  Administered 2020-09-04: 16 mL

## 2020-09-04 MED ORDER — BUPIVACAINE-EPINEPHRINE 0.25% -1:200000 IJ SOLN
INTRAMUSCULAR | Status: DC | PRN
  Administered 2020-09-04: 30 mL

## 2020-09-04 MED ORDER — HYDRALAZINE HCL 20 MG/ML IJ SOLN: 5.0000 mg | Freq: Once | INTRAMUSCULAR | Status: AC

## 2020-09-04 MED ORDER — ROCURONIUM BROMIDE 10 MG/ML (PF) SYRINGE
PREFILLED_SYRINGE | INTRAVENOUS | Status: AC
  Filled 2020-09-04: qty 10

## 2020-09-04 MED ORDER — ONDANSETRON HCL 4 MG PO TABS: 4.0000 mg | ORAL_TABLET | Freq: Three times a day (TID) | ORAL | 5 refills | Status: DC | PRN

## 2020-09-04 MED ORDER — ONDANSETRON HCL 4 MG/2ML IJ SOLN
INTRAMUSCULAR | Status: DC | PRN
  Administered 2020-09-04: 4 mg via INTRAVENOUS

## 2020-09-04 MED ORDER — ENSURE PRE-SURGERY PO LIQD
296.0000 mL | Freq: Once | ORAL | Status: DC
Start: 2020-09-05 — End: 2020-09-04

## 2020-09-04 MED ORDER — PROPOFOL 10 MG/ML IV BOLUS
INTRAVENOUS | Status: AC
  Filled 2020-09-04: qty 20

## 2020-09-04 MED ORDER — CHLORHEXIDINE GLUCONATE 0.12 % MT SOLN
15.0000 mL | Freq: Once | OROMUCOSAL | Status: AC
  Administered 2020-09-04: 15 mL via OROMUCOSAL

## 2020-09-04 MED ORDER — STERILE WATER FOR IRRIGATION IR SOLN
Status: DC | PRN
  Administered 2020-09-04: 1000 mL

## 2020-09-04 MED ORDER — PROPOFOL 10 MG/ML IV BOLUS
INTRAVENOUS | Status: DC | PRN
  Administered 2020-09-04: 150 mg via INTRAVENOUS

## 2020-09-04 MED ORDER — KETAMINE HCL 10 MG/ML IJ SOLN
INTRAMUSCULAR | Status: DC | PRN
  Administered 2020-09-04: 50 mg via INTRAVENOUS

## 2020-09-04 MED ORDER — LIDOCAINE 2% (20 MG/ML) 5 ML SYRINGE
INTRAMUSCULAR | Status: DC | PRN
  Administered 2020-09-04: 80 mg via INTRAVENOUS

## 2020-09-04 MED ORDER — ORAL CARE MOUTH RINSE: 15.0000 mL | Freq: Once | OROMUCOSAL | Status: AC

## 2020-09-04 MED ORDER — PHENYLEPHRINE 40 MCG/ML (10ML) SYRINGE FOR IV PUSH (FOR BLOOD PRESSURE SUPPORT)
PREFILLED_SYRINGE | INTRAVENOUS | Status: DC | PRN
  Administered 2020-09-04: 200 ug via INTRAVENOUS

## 2020-09-04 MED ORDER — LACTATED RINGERS IV SOLN: INTRAVENOUS | Status: DC

## 2020-09-04 MED ORDER — HYDRALAZINE HCL 20 MG/ML IJ SOLN
INTRAMUSCULAR | Status: AC
  Administered 2020-09-04: 5 mg via INTRAVENOUS
  Filled 2020-09-04: qty 1

## 2020-09-04 MED ORDER — DEXAMETHASONE SODIUM PHOSPHATE 10 MG/ML IJ SOLN
INTRAMUSCULAR | Status: AC
  Filled 2020-09-04: qty 1

## 2020-09-04 MED ORDER — DEXAMETHASONE SODIUM PHOSPHATE 10 MG/ML IJ SOLN
INTRAMUSCULAR | Status: DC | PRN
  Administered 2020-09-04: 5 mg via INTRAVENOUS

## 2020-09-04 MED ORDER — OXYCODONE HCL 5 MG PO TABS
5.0000 mg | ORAL_TABLET | Freq: Once | ORAL | Status: AC
Start: 2020-09-04 — End: 2020-09-04
  Administered 2020-09-04: 5 mg via ORAL

## 2020-09-04 MED ORDER — ONDANSETRON HCL 4 MG/2ML IJ SOLN
INTRAMUSCULAR | Status: AC
  Filled 2020-09-04: qty 2

## 2020-09-04 MED ORDER — EPHEDRINE SULFATE-NACL 50-0.9 MG/10ML-% IV SOSY
PREFILLED_SYRINGE | INTRAVENOUS | Status: DC | PRN
  Administered 2020-09-04 (×2): 5 mg via INTRAVENOUS
  Administered 2020-09-04: 10 mg via INTRAVENOUS
  Administered 2020-09-04: 5 mg via INTRAVENOUS

## 2020-09-04 MED ORDER — SUGAMMADEX SODIUM 200 MG/2ML IV SOLN
INTRAVENOUS | Status: DC | PRN
  Administered 2020-09-04: 200 mg via INTRAVENOUS

## 2020-09-04 MED ORDER — ACETAMINOPHEN 500 MG PO TABS
1000.0000 mg | ORAL_TABLET | ORAL | Status: AC
  Administered 2020-09-04: 1000 mg via ORAL
  Filled 2020-09-04: qty 2

## 2020-09-04 MED ORDER — ROCURONIUM BROMIDE 10 MG/ML (PF) SYRINGE
PREFILLED_SYRINGE | INTRAVENOUS | Status: DC | PRN
  Administered 2020-09-04: 20 mg via INTRAVENOUS
  Administered 2020-09-04: 80 mg via INTRAVENOUS

## 2020-09-04 MED ORDER — FENTANYL CITRATE (PF) 100 MCG/2ML IJ SOLN: 25.0000 ug | INTRAMUSCULAR | Status: DC | PRN

## 2020-09-04 SURGICAL SUPPLY — 52 items
APPLIER CLIP 5 13 M/L LIGAMAX5 (MISCELLANEOUS) ×3
APPLIER CLIP ROT 10 11.4 M/L (STAPLE)
CABLE HIGH FREQUENCY MONO STRZ (ELECTRODE) ×3 IMPLANT
CHLORAPREP W/TINT 26 (MISCELLANEOUS) ×3 IMPLANT
CLIP APPLIE 5 13 M/L LIGAMAX5 (MISCELLANEOUS) ×2 IMPLANT
CLIP APPLIE ROT 10 11.4 M/L (STAPLE) IMPLANT
COVER MAYO STAND STRL (DRAPES) ×3 IMPLANT
COVER SURGICAL LIGHT HANDLE (MISCELLANEOUS) ×3 IMPLANT
COVER WAND RF STERILE (DRAPES) IMPLANT
DECANTER SPIKE VIAL GLASS SM (MISCELLANEOUS) ×3 IMPLANT
DRAIN CHANNEL 19F RND (DRAIN) IMPLANT
DRAPE C-ARM 42X120 X-RAY (DRAPES) ×3 IMPLANT
DRAPE LAPAROSCOPIC ABDOMINAL (DRAPES) ×3 IMPLANT
DRAPE UTILITY XL STRL (DRAPES) ×3 IMPLANT
DRAPE WARM FLUID 44X44 (DRAPES) ×3 IMPLANT
DRSG TEGADERM 2-3/8X2-3/4 SM (GAUZE/BANDAGES/DRESSINGS) ×9 IMPLANT
DRSG TEGADERM 4X4.75 (GAUZE/BANDAGES/DRESSINGS) ×3 IMPLANT
ELECT REM PT RETURN 15FT ADLT (MISCELLANEOUS) ×3 IMPLANT
ENDOLOOP SUT PDS II  0 18 (SUTURE)
ENDOLOOP SUT PDS II 0 18 (SUTURE) IMPLANT
EVACUATOR SILICONE 100CC (DRAIN) IMPLANT
GAUZE SPONGE 2X2 8PLY STRL LF (GAUZE/BANDAGES/DRESSINGS) ×2 IMPLANT
GLOVE SURG LTX SZ8 (GLOVE) ×3 IMPLANT
GLOVE SURG UNDER LTX SZ8 (GLOVE) ×3 IMPLANT
GOWN STRL REUS W/TWL XL LVL3 (GOWN DISPOSABLE) ×9 IMPLANT
IRRIG SUCT STRYKERFLOW 2 WTIP (MISCELLANEOUS) ×3
IRRIGATION SUCT STRKRFLW 2 WTP (MISCELLANEOUS) ×2 IMPLANT
KIT BASIN OR (CUSTOM PROCEDURE TRAY) ×3 IMPLANT
KIT TURNOVER KIT A (KITS) ×3 IMPLANT
NEEDLE BIOPSY 14GX4.5 SOFT TIS (NEEDLE) IMPLANT
NEEDLE BIOPSY 14X6 SOFT TISS (NEEDLE) IMPLANT
PAD POSITIONING PINK XL (MISCELLANEOUS) ×3 IMPLANT
PENCIL SMOKE EVACUATOR (MISCELLANEOUS) IMPLANT
POUCH RETRIEVAL ECOSAC 10 (ENDOMECHANICALS) IMPLANT
POUCH RETRIEVAL ECOSAC 10MM (ENDOMECHANICALS)
PROTECTOR NERVE ULNAR (MISCELLANEOUS) ×3 IMPLANT
SCISSORS LAP 5X35 DISP (ENDOMECHANICALS) ×3 IMPLANT
SEALER TISSUE G2 STRG ARTC 35C (ENDOMECHANICALS) IMPLANT
SET CHOLANGIOGRAPH MIX (MISCELLANEOUS) ×3 IMPLANT
SET TUBE SMOKE EVAC HIGH FLOW (TUBING) ×3 IMPLANT
SHEARS HARMONIC ACE PLUS 36CM (ENDOMECHANICALS) ×3 IMPLANT
SPONGE GAUZE 2X2 STER 10/PKG (GAUZE/BANDAGES/DRESSINGS) ×1
SUT MNCRL AB 4-0 PS2 18 (SUTURE) ×3 IMPLANT
SUT PDS AB 1 CT1 27 (SUTURE) ×6 IMPLANT
SYR 20ML LL LF (SYRINGE) ×3 IMPLANT
TOWEL OR 17X26 10 PK STRL BLUE (TOWEL DISPOSABLE) ×3 IMPLANT
TOWEL OR NON WOVEN STRL DISP B (DISPOSABLE) ×3 IMPLANT
TRAY FOLEY MTR SLVR 16FR STAT (SET/KITS/TRAYS/PACK) IMPLANT
TRAY LAPAROSCOPIC (CUSTOM PROCEDURE TRAY) ×3 IMPLANT
TROCAR BLADELESS OPT 5 100 (ENDOMECHANICALS) ×3 IMPLANT
TROCAR BLADELESS OPT 5 150 (ENDOMECHANICALS) ×3 IMPLANT
TROCAR XCEL NON-BLD 11X100MML (ENDOMECHANICALS) IMPLANT

## 2020-09-04 NOTE — Anesthesia Postprocedure Evaluation (Signed)
Anesthesia Post Note  Patient: Zachary Sanchez  Procedure(s) Performed: LAPAROSCOPIC CHOLECYSTECTOMY SINGLE SITE WITH INTRAOPERATIVE CHOLANGIOGRAM BILATERAL TAP BLOCK (N/A Abdomen)     Patient location during evaluation: PACU Anesthesia Type: General Level of consciousness: awake and alert Pain management: pain level controlled Vital Signs Assessment: post-procedure vital signs reviewed and stable Respiratory status: spontaneous breathing, nonlabored ventilation, respiratory function stable and patient connected to nasal cannula oxygen Cardiovascular status: blood pressure returned to baseline and stable Postop Assessment: no apparent nausea or vomiting Anesthetic complications: no   No complications documented.  Last Vitals:  Vitals:   09/04/20 1025 09/04/20 1107  BP: (!) 150/94 (!) 150/93  Pulse: 65 72  Resp: 20 20  Temp:    SpO2: 93% 97%    Last Pain:  Vitals:   09/04/20 1025  TempSrc:   PainSc: 3                  Kairon Shock L Goble Fudala

## 2020-09-04 NOTE — Transfer of Care (Signed)
Immediate Anesthesia Transfer of Care Note  Patient: Zachary Sanchez  Procedure(s) Performed: LAPAROSCOPIC CHOLECYSTECTOMY SINGLE SITE WITH INTRAOPERATIVE CHOLANGIOGRAM BILATERAL TAP BLOCK (N/A Abdomen)  Patient Location: PACU  Anesthesia Type:General  Level of Consciousness: awake, alert  and oriented  Airway & Oxygen Therapy: Patient Spontanous Breathing and Patient connected to face mask  Post-op Assessment: Report given to RN and Post -op Vital signs reviewed and stable  Post vital signs: Reviewed and stable  Last Vitals:  Vitals Value Taken Time  BP 142/94 09/04/20 0915  Temp 36.4 C 09/04/20 0915  Pulse 68 09/04/20 0919  Resp 11 09/04/20 0919  SpO2 93 % 09/04/20 0919  Vitals shown include unvalidated device data.  Last Pain:  Vitals:   09/04/20 0915  TempSrc:   PainSc: Asleep         Complications: No complications documented.

## 2020-09-04 NOTE — Discharge Instructions (Signed)
LAPAROSCOPIC SURGERY: POST OP INSTRUCTIONS  ######################################################################  EAT Gradually transition to a high fiber diet with a fiber supplement over the next few weeks after discharge.  Start with a pureed / full liquid diet (see below)  WALK Walk an hour a day.  Control your pain to do that.    CONTROL PAIN Control pain so that you can walk, sleep, tolerate sneezing/coughing, go up/down stairs.  HAVE A BOWEL MOVEMENT DAILY Keep your bowels regular to avoid problems.  OK to try a laxative to override constipation.  OK to use an antidairrheal to slow down diarrhea.  Call if not better after 2 tries  CALL IF YOU HAVE PROBLEMS/CONCERNS Call if you are still struggling despite following these instructions. Call if you have concerns not answered by these instructions  ######################################################################    1. DIET: Follow a light bland diet & liquids the first 24 hours after arrival home, such as soup, liquids, starches, etc.  Be sure to drink plenty of fluids.  Quickly advance to a usual solid diet within a few days.  Avoid fast food or heavy meals as your are more likely to get nauseated or have irregular bowels.  A low-fat, high-fiber diet for the rest of your life is ideal.  2. Take your usually prescribed home medications unless otherwise directed.  3. PAIN CONTROL: a. Pain is best controlled by a usual combination of three different methods TOGETHER: i. Ice/Heat ii. Over the counter pain medication iii. Prescription pain medication b. Most patients will experience some swelling and bruising around the incisions.  Ice packs or heating pads (30-60 minutes up to 6 times a day) will help. Use ice for the first few days to help decrease swelling and bruising, then switch to heat to help relax tight/sore spots and speed recovery.  Some people prefer to use ice alone, heat alone, alternating between ice & heat.   Experiment to what works for you.  Swelling and bruising can take several weeks to resolve.   c. It is helpful to take an over-the-counter pain medication regularly for the first few weeks.  Choose one of the following that works best for you: i. Naproxen (Aleve, etc)  Two 220mg tabs twice a day ii. Ibuprofen (Advil, etc) Three 200mg tabs four times a day (every meal & bedtime) iii. Acetaminophen (Tylenol, etc) 500-650mg four times a day (every meal & bedtime) d. A  prescription for pain medication (such as oxycodone, hydrocodone, tramadol, gabapentin, methocarbamol, etc) should be given to you upon discharge.  Take your pain medication as prescribed.  i. If you are having problems/concerns with the prescription medicine (does not control pain, nausea, vomiting, rash, itching, etc), please call us [phone] to see if we need to switch you to a different pain medicine that will work better for you and/or control your side effect better. ii. If you need a refill on your pain medication, please give us 48 hour notice.  contact your pharmacy.  They will contact our office to request authorization. Prescriptions will not be filled after 5 pm or on week-ends  4. Avoid getting constipated.   a. Between the surgery and the pain medications, it is common to experience some constipation.   b. Increasing fluid intake and taking a fiber supplement (such as Metamucil, Citrucel, FiberCon, MiraLax, etc) 1-2 times a day regularly will usually help prevent this problem from occurring.   c. A mild laxative (prune juice, Milk of Magnesia, MiraLax, etc) should be taken according to   package directions if there are no bowel movements after 48 hours.   5. Watch out for diarrhea.   a. If you have many loose bowel movements, simplify your diet to bland foods & liquids for a few days.   b. Stop any stool softeners and decrease your fiber supplement.   c. Switching to mild anti-diarrheal medications (Kayopectate, Pepto  Bismol) can help.   d. If this worsens or does not improve, please call us.  6. Wash / shower every day.  You may shower over the dressings as they are waterproof.  Continue to shower over incision(s) after the dressing is off.  7. REMOVE ALL DRESSINGS: Remove your waterproof bandages (tegaderm clear band-aids, steristrip skin tapes, etc) THREE DAYS AFTER SURGERY.  You may leave the incisions open to air.  You may replace a dressing/Band-Aid to cover the incision for comfort if you wish.   8. ACTIVITIES as tolerated:   i. You may resume regular (light) daily activities beginning the next day--such as daily self-care, walking, climbing stairs--gradually increasing activities as tolerated.  If you can walk 30 minutes without difficulty, it is safe to try more intense activity such as jogging, treadmill, bicycling, low-impact aerobics, swimming, etc. ii. Save the most intensive and strenuous activity for last such as sit-ups, heavy lifting, contact sports, etc  Refrain from any heavy lifting or straining until you are off narcotics for pain control.   iii. DO NOT PUSH THROUGH PAIN.  Let pain be your guide: If it hurts to do something, don't do it.  Pain is your body warning you to avoid that activity for another week until the pain goes down. iv. You may drive when you are no longer taking prescription pain medication, you can comfortably wear a seatbelt, and you can safely maneuver your car and apply brakes. v. You may have sexual intercourse when it is comfortable.  9. FOLLOW UP in our office a. Please call [facility] at [phone] to set up an appointment to see your surgeon in the office for a follow-up appointment approximately 2-3 weeks after your surgery. b. Make sure that you call for this appointment the day you arrive home to insure a convenient appointment time.  10. IF YOU HAVE DISABILITY OR FAMILY LEAVE FORMS, BRING THEM TO THE OFFICE FOR PROCESSING.  DO NOT GIVE THEM TO YOUR  DOCTOR.   WHEN TO CALL us [phone]: 1. Poor pain control 2. Reactions / problems with new medications (rash/itching, nausea, etc)  3. Fever over 101.5 F (38.5 C) 4. Inability to urinate 5. Nausea and/or vomiting 6. Worsening swelling or bruising 7. Continued bleeding from incision. 8. Increased pain, redness, or drainage from the incision   The clinic staff is available to answer your questions during regular business hours (8:30am-5pm).  Please don't hesitate to call and ask to speak to one of our nurses for clinical concerns.   If you have a medical emergency, go to the nearest emergency room or call 911.  A surgeon from [facility] is always on call at the [facility], [place], [place], [place], [place]  [address] ? MAIN: [phone] ? TOLL FREE: [phone] ?  FAX [phone] [email or web address]     Cholecystitis  Cholecystitis is irritation and swelling (inflammation) of the gallbladder. The gallbladder is an organ that is shaped like a pear. It is under the liver on the right side of the body. This organ stores bile. Bile  helps the body break down (digest) the fats in food. This condition can occur all of a sudden. It needs to be treated. What are the causes? This condition may be caused by stones or lumps that form in the gallbladder (gallstones). Gallstones can block the tube (duct) that carries bile out of your gallbladder. Other causes are:  Damage to the gallbladder due to less blood flow.  Germs in the bile ducts.  Scars or kinks in the bile ducts.  Abnormal growths (tumors) in the liver, pancreas, or gallbladder. What increases the risk? You are more likely to develop this condition if:  You have sickle cell disease.  You take birth control pills.  You use estrogen.  You have alcoholic liver disease.  You have liver cirrhosis.  You are being fed through a vein.  You  are very ill.  You do not eat or drink for a long time. This is also called "fasting."  You are overweight (obese).  You lose weight too fast.  You are pregnant.  You have high levels of fat in the blood (triglycerides).  You have irritation and swelling of the pancreas (pancreatitis). What are the signs or symptoms? Symptoms of this condition include:  Pain in the belly (abdomen). Pain is often in the upper right area of the belly.  Tenderness or bloating in the belly.  Feeling sick to your stomach (nauseous).  Throwing up (vomiting).  Fever.  Chills. How is this diagnosed? This condition may be diagnosed with a medical history and exam. You may also have other tests, such as:  Imaging tests. This may include: ? Ultrasound. ? CT scan of the belly. ? Nuclear scan. This is also called a HIDA scan. This scan lets your doctor see the bile as it moves in your body. ? MRI.  Blood tests. These are done to check: ? Your blood count. The white blood cell count may be higher than normal. ? How well your liver works.   How is this treated? This condition may be treated with:  Surgery to take out your gallbladder.  Antibiotic medicines to treat illnesses caused by germs.  Going without food for some time.  Giving fluids through an IV tube.  Medicines to treat pain or throwing up. Follow these instructions at home:  If you had surgery, follow instructions from your doctor about how to care for yourself after you go home. Medicines  Take over-the-counter and prescription medicines only as told by your doctor.  If you were prescribed an antibiotic medicine, take it as told by your doctor. Do not stop taking it even if you start to feel better.   General instructions  Follow instructions from your doctor about what to eat or drink. Do not eat or drink anything that makes you sick again.  Do not lift anything that is heavier than 10 lb (4.5 kg) until your doctor says  that it is safe.  Do not use any products that contain nicotine or tobacco, such as cigarettes and e-cigarettes. If you need help quitting, ask your doctor.  Keep all follow-up visits as told by your doctor. This is important. Contact a doctor if:  You have pain and your medicine does not help.  You have a fever. Get help right away if:  Your pain moves to: ? Another part of your belly. ? Your back.  Your symptoms do not go away.  You have new symptoms. Summary  Cholecystitis is swelling and irritation of the gallbladder.  This condition may be caused by stones or lumps that form in the gallbladder (gallstones).  Common symptoms are pain in the belly. You may feel sick to your stomach and start throwing up. You may also have a fever and chills.  This condition may be treated with surgery to take out the gallbladder. It may also be treated with medicines, fasting, and fluids through an IV tube.  Follow what you are told about eating and drinking. Do not eat things that make you sick again. This information is not intended to replace advice given to you by your health care provider. Make sure you discuss any questions you have with your health care provider. Document Revised: 08/11/2017 Document Reviewed: 08/11/2017 Elsevier Patient Education  [place].

## 2020-09-04 NOTE — Anesthesia Procedure Notes (Signed)
Procedure Name: Intubation Performed by: Rosaland Lao, CRNA Pre-anesthesia Checklist: Patient identified, Emergency Drugs available, Suction available and Patient being monitored Patient Re-evaluated:Patient Re-evaluated prior to induction Oxygen Delivery Method: Circle system utilized Preoxygenation: Pre-oxygenation with 100% oxygen Induction Type: IV induction Ventilation: Mask ventilation without difficulty Laryngoscope Size: Miller and 3 Grade View: Grade I Tube type: Oral Number of attempts: 1 Airway Equipment and Method: Stylet Placement Confirmation: ETT inserted through vocal cords under direct vision,  positive ETCO2 and breath sounds checked- equal and bilateral Secured at: 23 cm Tube secured with: Tape Dental Injury: Teeth and Oropharynx as per pre-operative assessment

## 2020-09-04 NOTE — Progress Notes (Signed)
Patient awake and recovering feeling well.  Discussed operative findings.  PACU nurse at bedside.  Could not reach wife but found in the holding area  I discussed operative findings, updated the patient's status, discussed probable steps to recovery, and gave postoperative recommendations to the patient's spouse.  Recommendations were made.  Questions were answered.  She expressed understanding & appreciation.

## 2020-09-04 NOTE — Op Note (Signed)
09/04/2020  PATIENT:  Zachary Sanchez  75 y.o. male  Patient Care Team: Derinda Late, MD as PCP - General (Family Medicine) Thompson Grayer, MD as PCP - Electrophysiology (Cardiology) Thompson Grayer, MD as PCP - Cardiology (Cardiology) Michael Boston, MD as Consulting Physician (General Surgery) Richmond Campbell, MD as Consulting Physician (Gastroenterology)  PRE-OPERATIVE DIAGNOSIS:    Chronic Calculus cholecystitis  POST-OPERATIVE DIAGNOSIS:   Chronic Calculus cholecystitis  PROCEDURE:  SINGLE SITE Laparoscopic cholecystectomy with intraoperative cholangiogram (CPT code [phone]) with IOC interpretaion  SURGEON:  Adin Hector, MD, FACS.  ASSISTANT: OR Staff   ANESTHESIA:    General with endotracheal intubation Local anesthetic as a field block  EBL:  (See Anesthesia Intraoperative Record) Total I/O In: -  Out: 10 [Blood:10]  Delay start of Pharmacological VTE agent (>24hrs) due to surgical blood loss or risk of bleeding:  no  DRAINS: None   SPECIMEN: Gallbladder    DISPOSITION OF SPECIMEN:  PATHOLOGY  COUNTS:  YES  PLAN OF CARE: Discharge to home after PACU  PATIENT DISPOSITION:  PACU - hemodynamically stable.  INDICATION: Pleasant patient chronically anticoagulated with prior urological surgery with evidence of abdominal pain and gallbladder polyp versus stones.  Story very suspicious for biliary colic.  Rest of differential diagnosis seems underwhelming.  I offered cholecystectomy.  The anatomy & physiology of hepatobiliary & pancreatic function was discussed.  The pathophysiology of gallbladder dysfunction was discussed.  Natural history risks without surgery was discussed.   I feel the risks of no intervention will lead to serious problems that outweigh the operative risks; therefore, I recommended cholecystectomy to remove the pathology.  I explained laparoscopic techniques with possible need for an open approach.  Probable cholangiogram to evaluate the bilary tract  was explained as well.    Risks such as bleeding, infection, abscess, leak, injury to other organs, need for further treatment, heart attack, death, and other risks were discussed.  I noted a good likelihood this will help address the problem.  Possibility that this will not correct all abdominal symptoms was explained.  Goals of post-operative recovery were discussed as well.  We will work to minimize complications.  An educational handout further explaining the pathology and treatment options was given as well.  Questions were answered.  The patient expresses understanding & wishes to proceed with surgery.  OR FINDINGS: Boggy dilated gallbladder with some edema consistent with chronic cholecystitis.  Cholangiogram with dilated biliary system.  Perhaps some distal common bile duct stone and sludge that easily flushed into the duodenum.  No obstruction or leak or obvious mass  Liver: normal  DESCRIPTION:   The patient was identified & brought in the operating room. The patient was positioned supine with arms tucked. SCDs were active during the entire case. The patient underwent general anesthesia without any difficulty.  The abdomen was prepped and draped in a sterile fashion. A Surgical Timeout confirmed our plan.  I made a transverse curvilinear incision through the superior umbilical fold.  I placed a 74mm long port through the supraumbilical fascia using a modified Hassan cutdown technique with umbilical stalk fascial countertraction. I began carbon dioxide insufflation.  No change in end tidal CO2 measurement.   Camera inspection revealed no injury. There were no adhesions to the anterior abdominal wall supraumbilically.  I proceeded to continue with single site technique. I placed a #5 port in left upper aspect of the wound. I placed a 5 mm atraumatic grasper in the right inferior aspect of the wound.  I turned attention to the right upper quadrant.  Gallbladder was dilated boggy with some  thickening consistent with chronic cholecystitis.  No strong evidence of an acute infection at this time.  The gallbladder fundus was elevated cephalad. I freed adhesions to the ventral surface of the gallbladder off carefully.  I freed the peritoneal coverings between the gallbladder and the liver on the posteriolateral and anteriomedial walls. I alternated between Harmonic & blunt Maryland dissection to help get a good critical view of the cystic artery and cystic duct.  did further dissection to free allof the gallbladder off the liver bed to get a good critical view of the infundibulum and cystic duct. I dissected out the cystic artery; and, after getting a good 360 view, ligated the anterior & posterior branches of the cystic artery close on the infundibulum using the Harmonic ultrasonic dissection.  I skeletonized the cystic duct.  I placed a clip on the infundibulum. I did a partial cystic duct-otomy and ensured patency. I placed a 5 [place] cholangiocatheter through a puncture site at the right subcostal ridge of the abdominal wall and directed it into the cystic duct.  We ran a cholangiogram with dilute radio-opaque contrast and continuous fluoroscopy. Contrast flowed from a side branch consistent with cystic duct cannulization. Contrast flowed up the common hepatic duct into the right and left intrahepatic chains out to secondary radicals. Contrast flowed down the common bile duct easily across the normal ampulla into the duodenum.  Perhaps there are a few small tiny stones versus sludge but this flush down the duodenum easily.  Dr. Dema Severin was in the room and agreed cholangiogram looked rather underwhelming.  This was consistent with a normal cholangiogram.  I removed the cholangiocatheter. I placed clips on the cystic duct x4.   I completed cystic duct transection. I freed the gallbladder from its remaining attachments to the liver. I ensured hemostasis on the gallbladder fossa of the liver and  elsewhere. I inspected the rest of the abdomen & detected no injury nor bleeding elsewhere.  I removed the gallbladder out the supraumbilical fascia. I closed the fascia transversely using #1 PDS interrupted stitches. I closed the skin using 4-0 monocryl stitch.  Sterile dressing was applied. The patient was extubated & arrived in the PACU in stable condition..  I had discussed postoperative care with the patient in the holding area.  I discussed typical postoperative course with recommendations.  Instructions written.  Questions answered.  Made attempt to reach his wife on the phone with no answer.  We will try again later.  Patient hopeful to recover enough to be able to go to a trip to [place] in July.  Hopefully a decent likelihood if things go well.  Follow-up closely as an outpatient.  Adin Hector, M.D., F.A.C.S. Gastrointestinal and Minimally Invasive Surgery [facility] [address] [place], [place] [address] [phone] Main / Paging  09/04/2020 9:06 AM

## 2020-09-04 NOTE — Interval H&P Note (Signed)
History and Physical Interval Note:  09/04/2020 7:46 AM  Zachary Sanchez  has presented today for surgery, with the diagnosis of SYMPTOMATIC BILIARY COLIC, PROBABLY CHRONIC CHOLECYSTITIS.  The various methods of treatment have been discussed with the patient and family. After consideration of risks, benefits and other options for treatment, the patient has consented to  Procedure(s): LAPAROSCOPIC CHOLECYSTECTOMY SINGLE SITE WITH INTRAOPERATIVE CHOLANGIOGRAM (N/A) DIAGNOSTIC LAPAROSCOPIC LIVER BIOPSY (N/A) as a surgical intervention.  The patient's history has been reviewed, patient examined, no change in status, stable for surgery.  I have reviewed the patient's chart and labs.  Questions were answered to the patient's satisfaction.    I have re-reviewed the the patient's records, history, medications, and allergies.  I have re-examined the patient.  I again discussed intraoperative plans and goals of post-operative recovery.  The patient agrees to proceed.  Zachary Sanchez  August 08, 1945 [id number]  Patient Care Team: Derinda Late, MD as PCP - General (Family Medicine) Thompson Grayer, MD as PCP - Electrophysiology (Cardiology) Thompson Grayer, MD as PCP - Cardiology (Cardiology) Michael Boston, MD as Consulting Physician (General Surgery) Richmond Campbell, MD as Consulting Physician (Gastroenterology)  Patient Active Problem List   Diagnosis Date Noted   Hyponatremia 03/24/2016   Pulmonary emboli ([place]) 03/23/2016   S/P knee replacement 03/04/2016   Prostate cancer ([place]) 07/09/2015   Aortic root enlargement ([place]) 02/25/2013   Permanent atrial fibrillation ([place]) 12/12/2008   COLONIC POLYPS 09/25/2008   HYPERLIPIDEMIA 09/25/2008   GOUT 09/25/2008   BENIGN PROSTATIC HYPERTROPHY, HX OF 09/25/2008    Past Medical History:  Diagnosis Date   Aortic root enlargement ([place])    aortic root 14mm in size   Atrial flutter ([place])    Biatrial enlargement    Colonic polyp    DJD (degenerative joint  disease)    DVT (deep venous thrombosis) ([place]) 03/1016   post knee surgery, on eliquis   Dysrhythmia    a fib   Gout    Heart murmur    when younger   History of benign prostatic hypertrophy    Hyperlipidemia    Persistent atrial fibrillation ([place])    longstanding persistent, asymptomatic   Pneumonia    Prostate cancer ([place])    Pulmonary emboli ([place]) 03/2016   from DVT post knee surgery   Wears glasses     Past Surgical History:  Procedure Laterality Date   FRACTURE SURGERY     ankles and fingers   KNEE SURGERY     right; menicus tear    LYMPHADENECTOMY Bilateral 07/09/2015   Procedure: PELVIC LYMPHADENECTOMY;  Surgeon: Raynelle Bring, MD;  Location: [facility];  Service: Urology;  Laterality: Bilateral;   QUADRICEPS REPAIR     ROBOT ASSISTED LAPAROSCOPIC RADICAL PROSTATECTOMY N/A 07/09/2015   Procedure: XI ROBOTIC ASSISTED LAPAROSCOPIC RADICAL PROSTATECTOMY LEVEL 2;  Surgeon: Raynelle Bring, MD;  Location: [facility];  Service: Urology;  Laterality: N/A;   TONSILLECTOMY     TOTAL KNEE ARTHROPLASTY Right 03/04/2016   Procedure: RIGHT TOTAL KNEE ARTHROPLASTY;  Surgeon: Sydnee Cabal, MD;  Location: [facility];  Service: Orthopedics;  Laterality: Right;    Social History   Socioeconomic History   Marital status: Married    Spouse name: Not on file   Number of children: Not on file   Years of education: Not on file   Highest education level: Not on file  Occupational History   Occupation: Engineer, maintenance (IT)    Employer: [employer]  Tobacco Use   Smoking  status: Former Smoker    Packs/day: 1.00    Years: 20.00    Pack years: 20.00    Types: Cigarettes    Quit date: 02/11/1979    Years since quitting: 41.5   Smokeless tobacco: Never Used  Vaping Use   Vaping Use: Never used  Substance and Sexual Activity   Alcohol use: Yes    Alcohol/week: 21.0 standard drinks    Types: 21 Glasses of wine per week    Comment: 2-3 glasses of wine daily   Drug use: No   Sexual activity:  Not Currently  Other Topics Concern   Not on file  Social History Narrative   Not on file   Social Determinants of Health   Financial Resource Strain: Not on file  Food Insecurity: Not on file  Transportation Needs: Not on file  Physical Activity: Not on file  Stress: Not on file  Social Connections: Not on file  Intimate Partner Violence: Not on file    Family History  Problem Relation Age of Onset   Heart attack Father    Alcohol abuse Father        Heavy smoker and drinker   Cancer Brother        base of tongue    Medications Prior to Admission  Medication Sig Dispense Refill Last Dose   allopurinol (ZYLOPRIM) 300 MG tablet Take 300 mg by mouth daily.   09/03/2020 at Unknown time   cholecalciferol (VITAMIN D) 1000 units tablet Take 2,000 Units by mouth daily.   Past Week at Unknown time   ezetimibe (ZETIA) 10 MG tablet Take 10 mg by mouth daily.   09/04/2020 at Unknown time   rivaroxaban (XARELTO) 20 MG TABS tablet Take 1 tablet (20 mg total) by mouth daily with supper. 90 tablet 3 09/02/2020 at 0700   rosuvastatin (CRESTOR) 40 MG tablet Take 1 tablet by mouth daily.   09/04/2020 at Unknown time   zolpidem (AMBIEN) 10 MG tablet Take 5 mg by mouth at bedtime as needed for sleep.   09/03/2020 at Unknown time    Current Facility-Administered Medications  Medication Dose Route Frequency Provider Last Rate Last Admin   bupivacaine liposome (EXPAREL) 1.3 % injection 266 mg  20 mL Infiltration Once Michael Boston, MD       Chlorhexidine Gluconate Cloth 2 % PADS 6 each  6 each Topical Once Michael Boston, MD       And   Chlorhexidine Gluconate Cloth 2 % PADS 6 each  6 each Topical Once Michael Boston, MD       lactated ringers infusion   Intravenous Continuous Annye Asa, MD         No Known Allergies  BP 128/76   Pulse (!) 56   Temp 98.2 F (36.8 C) (Oral)   Resp 18   Wt 98 kg   SpO2 95%   BMI 27.73 kg/m   Labs: No results found for this or any previous visit  (from the past 48 hour(s)).  Imaging / Studies: No results found.   Zachary Sanchez, M.D., F.A.C.S. Gastrointestinal and Minimally Invasive Surgery [facility] [address] [place], [place] [address] [phone] Main / Paging  09/04/2020 7:46 AM    Zachary Sanchez

## 2020-09-04 NOTE — Interval H&P Note (Signed)
History and Physical Interval Note:  09/04/2020 7:40 AM  Zachary Sanchez  has presented today for surgery, with the diagnosis of SYMPTOMATIC BILIARY COLIC, PROBABLY CHRONIC CHOLECYSTITIS.  The various methods of treatment have been discussed with the patient and family. After consideration of risks, benefits and other options for treatment, the patient has consented to  Procedure(s): LAPAROSCOPIC CHOLECYSTECTOMY SINGLE SITE WITH INTRAOPERATIVE CHOLANGIOGRAM (N/A) DIAGNOSTIC LAPAROSCOPIC LIVER BIOPSY (N/A) as a surgical intervention.  The patient's history has been reviewed, patient examined, no change in status, stable for surgery.  I have reviewed the patient's chart and labs.  Questions were answered to the patient's satisfaction.    I have re-reviewed the the patient's records, history, medications, and allergies.  I have re-examined the patient.  I again discussed intraoperative plans and goals of post-operative recovery.  The patient agrees to proceed.  Zachary Sanchez  09-26-1945 [id number]  Patient Care Team: Derinda Late, MD as PCP - General (Family Medicine) Thompson Grayer, MD as PCP - Electrophysiology (Cardiology) Thompson Grayer, MD as PCP - Cardiology (Cardiology) Michael Boston, MD as Consulting Physician (General Surgery) Richmond Campbell, MD as Consulting Physician (Gastroenterology)  Patient Active Problem List   Diagnosis Date Noted   Hyponatremia 03/24/2016   Pulmonary emboli ([place]) 03/23/2016   S/P knee replacement 03/04/2016   Prostate cancer ([place]) 07/09/2015   Aortic root enlargement ([place]) 02/25/2013   Permanent atrial fibrillation ([place]) 12/12/2008   COLONIC POLYPS 09/25/2008   HYPERLIPIDEMIA 09/25/2008   GOUT 09/25/2008   BENIGN PROSTATIC HYPERTROPHY, HX OF 09/25/2008    Past Medical History:  Diagnosis Date   Aortic root enlargement ([place])    aortic root 19mm in size   Atrial flutter ([place])    Biatrial enlargement    Colonic polyp    DJD (degenerative joint  disease)    DVT (deep venous thrombosis) ([place]) 03/1016   post knee surgery, on eliquis   Dysrhythmia    a fib   Gout    Heart murmur    when younger   History of benign prostatic hypertrophy    Hyperlipidemia    Persistent atrial fibrillation ([place])    longstanding persistent, asymptomatic   Pneumonia    Prostate cancer ([place])    Pulmonary emboli ([place]) 03/2016   from DVT post knee surgery   Wears glasses     Past Surgical History:  Procedure Laterality Date   FRACTURE SURGERY     ankles and fingers   KNEE SURGERY     right; menicus tear    LYMPHADENECTOMY Bilateral 07/09/2015   Procedure: PELVIC LYMPHADENECTOMY;  Surgeon: Raynelle Bring, MD;  [facility];  Service: Urology;  Laterality: Bilateral;   QUADRICEPS REPAIR     ROBOT ASSISTED LAPAROSCOPIC RADICAL PROSTATECTOMY N/A 07/09/2015   Procedure: XI ROBOTIC ASSISTED LAPAROSCOPIC RADICAL PROSTATECTOMY LEVEL 2;  Surgeon: Raynelle Bring, MD;  [facility];  Service: Urology;  Laterality: N/A;   TONSILLECTOMY     TOTAL KNEE ARTHROPLASTY Right 03/04/2016   Procedure: RIGHT TOTAL KNEE ARTHROPLASTY;  Surgeon: Sydnee Cabal, MD;  [facility];  Service: Orthopedics;  Laterality: Right;    Social History   Socioeconomic History   Marital status: Married    Spouse name: Not on file   Number of children: Not on file   Years of education: Not on file   Highest education level: Not on file  Occupational History   Occupation: Engineer, maintenance (IT)    Employer: [employer]  Tobacco Use   Smoking  status: Former Smoker    Packs/day: 1.00    Years: 20.00    Pack years: 20.00    Types: Cigarettes    Quit date: 02/11/1979    Years since quitting: 41.5   Smokeless tobacco: Never Used  Vaping Use   Vaping Use: Never used  Substance and Sexual Activity   Alcohol use: Yes    Alcohol/week: 21.0 standard drinks    Types: 21 Glasses of wine per week    Comment: 2-3 glasses of wine daily   Drug use: No   Sexual activity:  Not Currently  Other Topics Concern   Not on file  Social History Narrative   Not on file   Social Determinants of Health   Financial Resource Strain: Not on file  Food Insecurity: Not on file  Transportation Needs: Not on file  Physical Activity: Not on file  Stress: Not on file  Social Connections: Not on file  Intimate Partner Violence: Not on file    Family History  Problem Relation Age of Onset   Heart attack Father    Alcohol abuse Father        Heavy smoker and drinker   Cancer Brother        base of tongue    Medications Prior to Admission  Medication Sig Dispense Refill Last Dose   allopurinol (ZYLOPRIM) 300 MG tablet Take 300 mg by mouth daily.   09/03/2020 at Unknown time   cholecalciferol (VITAMIN D) 1000 units tablet Take 2,000 Units by mouth daily.   Past Week at Unknown time   ezetimibe (ZETIA) 10 MG tablet Take 10 mg by mouth daily.   09/04/2020 at Unknown time   rivaroxaban (XARELTO) 20 MG TABS tablet Take 1 tablet (20 mg total) by mouth daily with supper. 90 tablet 3 09/02/2020 at 0700   rosuvastatin (CRESTOR) 40 MG tablet Take 1 tablet by mouth daily.   09/04/2020 at Unknown time   zolpidem (AMBIEN) 10 MG tablet Take 5 mg by mouth at bedtime as needed for sleep.   09/03/2020 at Unknown time    Current Facility-Administered Medications  Medication Dose Route Frequency Provider Last Rate Last Admin   bupivacaine liposome (EXPAREL) 1.3 % injection 266 mg  20 mL Infiltration Once Michael Boston, MD       Chlorhexidine Gluconate Cloth 2 % PADS 6 each  6 each Topical Once Michael Boston, MD       And   Chlorhexidine Gluconate Cloth 2 % PADS 6 each  6 each Topical Once Michael Boston, MD       lactated ringers infusion   Intravenous Continuous Annye Asa, MD         No Known Allergies  BP 128/76   Pulse (!) 56   Temp 98.2 F (36.8 C) (Oral)   Resp 18   Wt 98 kg   SpO2 95%   BMI 27.73 kg/m   Labs: No results found for this or any previous visit  (from the past 48 hour(s)).  Imaging / Studies: No results found.   Adin Hector, M.D., F.A.C.S. Gastrointestinal and Minimally Invasive Surgery [facility] [address] [place], [place] [address] [phone] Main / Paging  09/04/2020 7:46 AM    Adin Hector

## 2020-09-05 ENCOUNTER — Encounter (HOSPITAL_COMMUNITY): Payer: Self-pay | Admitting: Surgery

## 2020-09-07 LAB — SURGICAL PATHOLOGY

## 2020-10-27 DIAGNOSIS — K4090 Unilateral inguinal hernia, without obstruction or gangrene, not specified as recurrent: Secondary | ICD-10-CM | POA: Diagnosis not present

## 2020-10-27 DIAGNOSIS — K5730 Diverticulosis of large intestine without perforation or abscess without bleeding: Secondary | ICD-10-CM | POA: Diagnosis not present

## 2020-10-27 DIAGNOSIS — R1030 Lower abdominal pain, unspecified: Secondary | ICD-10-CM | POA: Diagnosis not present

## 2020-12-01 ENCOUNTER — Other Ambulatory Visit: Payer: Self-pay | Admitting: Family Medicine

## 2020-12-01 DIAGNOSIS — R1030 Lower abdominal pain, unspecified: Secondary | ICD-10-CM

## 2020-12-02 ENCOUNTER — Ambulatory Visit
Admission: RE | Admit: 2020-12-02 | Discharge: 2020-12-02 | Disposition: A | Discharge status: Home / Self Care | Payer: PPO | Source: Ambulatory Visit | Attending: Family Medicine | Admitting: Family Medicine

## 2020-12-02 ENCOUNTER — Other Ambulatory Visit: Payer: Self-pay

## 2020-12-02 DIAGNOSIS — K429 Umbilical hernia without obstruction or gangrene: Secondary | ICD-10-CM | POA: Diagnosis not present

## 2020-12-02 DIAGNOSIS — R1030 Lower abdominal pain, unspecified: Secondary | ICD-10-CM

## 2020-12-02 DIAGNOSIS — N200 Calculus of kidney: Secondary | ICD-10-CM | POA: Diagnosis not present

## 2020-12-02 DIAGNOSIS — R110 Nausea: Secondary | ICD-10-CM | POA: Diagnosis not present

## 2020-12-02 DIAGNOSIS — K4090 Unilateral inguinal hernia, without obstruction or gangrene, not specified as recurrent: Secondary | ICD-10-CM | POA: Diagnosis not present

## 2020-12-02 MED ORDER — IOPAMIDOL (ISOVUE-300) INJECTION 61%
100.0000 mL | Freq: Once | INTRAVENOUS | Status: AC | PRN
  Administered 2020-12-02: 100 mL via INTRAVENOUS

## 2020-12-14 ENCOUNTER — Encounter (HOSPITAL_COMMUNITY)
Admission: RE | Admit: 2020-12-14 | Discharge: 2020-12-14 | Disposition: A | Discharge status: Home / Self Care | Payer: PPO | Source: Ambulatory Visit | Attending: Surgery | Admitting: Surgery

## 2020-12-14 ENCOUNTER — Other Ambulatory Visit: Payer: Self-pay

## 2020-12-14 ENCOUNTER — Ambulatory Visit: Payer: Self-pay | Admitting: Surgery

## 2020-12-14 ENCOUNTER — Encounter (HOSPITAL_COMMUNITY): Payer: Self-pay

## 2020-12-14 ENCOUNTER — Telehealth: Payer: Self-pay | Admitting: *Deleted

## 2020-12-14 DIAGNOSIS — K5909 Other constipation: Secondary | ICD-10-CM | POA: Diagnosis not present

## 2020-12-14 DIAGNOSIS — R112 Nausea with vomiting, unspecified: Secondary | ICD-10-CM | POA: Diagnosis not present

## 2020-12-14 DIAGNOSIS — K4090 Unilateral inguinal hernia, without obstruction or gangrene, not specified as recurrent: Secondary | ICD-10-CM | POA: Diagnosis not present

## 2020-12-14 NOTE — Patient Instructions (Addendum)
DUE TO COVID-19 ONLY ONE VISITOR IS ALLOWED TO COME WITH YOU AND STAY IN THE WAITING ROOM ONLY DURING PRE OP AND PROCEDURE DAY OF SURGERY IF YOU ARE GOING HOME AFTER SURGERY. IF YOU ARE SPENDING THE NIGHT 2 PEOPLE MAY VISIT WITH YOU IN YOUR PRIVATE ROOM AFTER SURGERY UNTIL VISITING  HOURS ARE OVER AT 800 PM AND THE 2 VISITORS CANNOT SPEND THE NIGHT.                Zachary Sanchez     Your procedure is scheduled on: 12/16/20   Report to [facility] Main  Entrance   Report to admitting at   10:15 AM     Call this number if you have problems the morning of surgery [phone]    Remember: Do not eat food  :After Midnight the night before your surgery,     You may have clear liquids from midnight until 9:30 AM    CLEAR LIQUID DIET   Foods Allowed                                                                     Foods Excluded  Coffee and tea, regular and decaf                             liquids that you cannot    NO MILK OR CREAMER Plain Jell-O any favor except red or purple                                           see through such as: Fruit ices (not with fruit pulp)                                     milk, soups, orange juice  Iced Popsicles                                    All solid food Carbonated beverages, regular and diet                                    Cranberry, grape and apple juices Sports drinks like Gatorade Lightly seasoned clear broth or consume(fat free) Sugar    BRUSH YOUR TEETH MORNING OF SURGERY AND RINSE YOUR MOUTH OUT, NO CHEWING GUM CANDY OR MINTS.     Take these medicines the morning of surgery with A SIP OF WATER: Allopurinol  Stop taking __Xarelto_________on __8/28/22________as instructed by __Dr. Gross___________.                                    You may not have any metal on your body including              piercings  Do not wear jewelry, Lotions, powders or  deodorant             Men may shave face and neck.   Do not bring  valuables to the hospital. [place].  Contacts, dentures or bridgework may not be worn into surgery.       Patients discharged the day of surgery will not be allowed to drive home.   IF YOU ARE HAVING SURGERY AND GOING HOME THE SAME DAY, YOU MUST HAVE AN ADULT TO DRIVE YOU HOME AND BE WITH YOU FOR 24 HOURS.   YOU MAY GO HOME BY TAXI OR UBER OR ORTHERWISE, BUT AN ADULT MUST ACCOMPANY YOU HOME AND STAY WITH YOU FOR 24 HOURS.  Name and phone number of your driver:  Special Instructions: N/A              Please read over the following fact sheets you were given: _____________________________________________________________________             [facility] - Preparing for Surgery Before surgery, you can play an important role.  Because skin is not sterile, your skin needs to be as free of germs as possible.  You can reduce the number of germs on your skin by washing with CHG (chlorahexidine gluconate) soap before surgery.  CHG is an antiseptic cleaner which kills germs and bonds with the skin to continue killing germs even after washing. Please DO NOT use if you have an allergy to CHG or antibacterial soaps.  If your skin becomes reddened/irritated stop using the CHG and inform your nurse when you arrive at Short Stay.  You may shave your face/neck. Please follow these instructions carefully:  1.  Shower with CHG Soap the night before surgery and the  morning of Surgery.  2.  If you choose to wash your hair, wash your hair first as usual with your  normal  shampoo.  3.  After you shampoo, rinse your hair and body thoroughly to remove the  shampoo.                            4.  Use CHG as you would any other liquid soap.  You can apply chg directly  to the skin and wash                       Gently with a scrungie or clean washcloth.  5.  Apply the CHG Soap to your body ONLY FROM THE NECK DOWN.   Do not use on face/ open                            Wound or open sores. Avoid contact with eyes, ears mouth and genitals (private parts).                       Wash face,  Genitals (private parts) with your normal soap.             6.  Wash thoroughly, paying special attention to the area where your surgery  will be performed.  7.  Thoroughly rinse your body with warm water from the neck down.  8.  DO NOT shower/wash with your normal soap after using and rinsing off  the CHG Soap.  9.  Pat yourself dry with a clean towel.            10.  Wear clean pajamas.            11.  Place clean sheets on your bed the night of your first shower and do not  sleep with pets. Day of Surgery : Do not apply any lotions/deodorants the morning of surgery.  Please wear clean clothes to the hospital/surgery center.  FAILURE TO FOLLOW THESE INSTRUCTIONS MAY RESULT IN THE CANCELLATION OF YOUR SURGERY PATIENT SIGNATURE_________________________________  NURSE SIGNATURE__________________________________  ________________________________________________________________________

## 2020-12-14 NOTE — Telephone Encounter (Signed)
   [facility] Pre-operative Risk Assessment    Patient Name: Zachary Sanchez  DOB: 09-29-1945 MRN: [id number]  HEARTCARE STAFF:  - IMPORTANT!!!!!! Under Visit Info/Reason for Call, type in Other and utilize the format Clearance MM/DD/YY or Clearance TBD. Do not use dashes or single digits. - Please review there is not already an duplicate clearance open for this procedure. - If request is for dental extraction, please clarify the # of teeth to be extracted. - If the patient is currently at the dentist's office, call Pre-Op Callback Staff (MA/nurse) to input urgent request.  - If the patient is not currently in the dentist office, please route to the Pre-Op pool.  Request for surgical clearance:  What type of surgery is being performed? INGUINAL HERNIA REPAIR  When is this surgery scheduled? TBD  What type of clearance is required (medical clearance vs. Pharmacy clearance to hold med vs. Both)? BOTH  Are there any medications that need to be held prior to surgery and how long? [facility]  Practice name and name of physician performing surgery? [facility]; DR. Remo Lipps GROSS  What is the office phone number? [phone]   7.   What is the office fax number? [place], CMA  8.   Anesthesia type (None, local, MAC, general) ? GENERAL   Julaine Hua 12/14/2020, 5:52 PM  _________________________________________________________________   (provider comments below)

## 2020-12-14 NOTE — Progress Notes (Signed)
COVID test Completed:NA  PCP - Dr. Johnella Moloney LOV 06/26/20 Cardiologist - Dr. Lenna Sciara. Allred LOV 06/18/20, clearance  08/12/20-epic  Chest x-ray - no EKG - 06/18/20-epic Stress Test - no ECHO - 08/09/16-epic Cardiac Cath - no Pacemaker/ICD device last checked:NA  Sleep Study - no CPAP -   Fasting Blood Sugar - NA Checks Blood Sugar _____ times a day  Blood Thinner Instructions:Xarelto/ Dr. Rayann Heman Aspirin Instructions:Stop taking  2 days prior to DOS/ Dr. Johney Maine Last Dose:12/13/20  Anesthesia review: yes  Patient denies shortness of breath, fever, cough and chest pain at PAT appointment. Yes Pt has no SOB with any activities.  Patient verbalized understanding of instructions that were given to them at the PAT appointment. Patient was also instructed that they will need to review over the PAT instructions again at home before surgery. Yes  PAT done on the phone all instructions read to the Pt. He indicated understanding

## 2020-12-15 MED ORDER — BUPIVACAINE LIPOSOME 1.3 % IJ SUSP
20.0000 mL | INTRAMUSCULAR | Status: DC
  Filled 2020-12-15: qty 20

## 2020-12-15 NOTE — Telephone Encounter (Signed)
Pharmacy, can you please comment on how long Xarelto can be held for upcoming procedure?  Thank you! 

## 2020-12-16 ENCOUNTER — Encounter (HOSPITAL_COMMUNITY): Payer: Self-pay | Admitting: Surgery

## 2020-12-16 ENCOUNTER — Ambulatory Visit (HOSPITAL_COMMUNITY)
Admission: RE | Admit: 2020-12-16 | Discharge: 2020-12-16 | Disposition: A | Discharge status: Home / Self Care | Payer: PPO | Attending: Surgery | Admitting: Surgery

## 2020-12-16 ENCOUNTER — Ambulatory Visit (HOSPITAL_COMMUNITY): Payer: PPO | Admitting: Certified Registered"

## 2020-12-16 ENCOUNTER — Encounter (HOSPITAL_COMMUNITY)
Admission: RE | Disposition: A | Discharge status: Home / Self Care | Payer: Self-pay | Source: Home / Self Care | Attending: Surgery

## 2020-12-16 ENCOUNTER — Ambulatory Visit (HOSPITAL_COMMUNITY): Payer: PPO | Admitting: Physician Assistant

## 2020-12-16 DIAGNOSIS — K4020 Bilateral inguinal hernia, without obstruction or gangrene, not specified as recurrent: Secondary | ICD-10-CM | POA: Diagnosis not present

## 2020-12-16 DIAGNOSIS — K6389 Other specified diseases of intestine: Secondary | ICD-10-CM | POA: Insufficient documentation

## 2020-12-16 DIAGNOSIS — E785 Hyperlipidemia, unspecified: Secondary | ICD-10-CM | POA: Diagnosis not present

## 2020-12-16 DIAGNOSIS — Z8249 Family history of ischemic heart disease and other diseases of the circulatory system: Secondary | ICD-10-CM | POA: Insufficient documentation

## 2020-12-16 DIAGNOSIS — Z87891 Personal history of nicotine dependence: Secondary | ICD-10-CM | POA: Diagnosis not present

## 2020-12-16 DIAGNOSIS — Z86711 Personal history of pulmonary embolism: Secondary | ICD-10-CM | POA: Insufficient documentation

## 2020-12-16 DIAGNOSIS — I4821 Permanent atrial fibrillation: Secondary | ICD-10-CM | POA: Insufficient documentation

## 2020-12-16 DIAGNOSIS — Z791 Long term (current) use of non-steroidal anti-inflammatories (NSAID): Secondary | ICD-10-CM | POA: Insufficient documentation

## 2020-12-16 DIAGNOSIS — Z79899 Other long term (current) drug therapy: Secondary | ICD-10-CM | POA: Insufficient documentation

## 2020-12-16 DIAGNOSIS — Z7901 Long term (current) use of anticoagulants: Secondary | ICD-10-CM | POA: Diagnosis not present

## 2020-12-16 DIAGNOSIS — E871 Hypo-osmolality and hyponatremia: Secondary | ICD-10-CM | POA: Diagnosis not present

## 2020-12-16 DIAGNOSIS — Z86718 Personal history of other venous thrombosis and embolism: Secondary | ICD-10-CM | POA: Diagnosis not present

## 2020-12-16 HISTORY — PX: INGUINAL HERNIA REPAIR: SHX194

## 2020-12-16 LAB — CBC
HCT: 39.6 % (ref 39.0–52.0)
Hemoglobin: 13.6 g/dL (ref 13.0–17.0)
MCH: 35 pg — ABNORMAL HIGH (ref 26.0–34.0)
MCHC: 34.3 g/dL (ref 30.0–36.0)
MCV: 101.8 fL — ABNORMAL HIGH (ref 80.0–100.0)
Platelets: 186 10*3/uL (ref 150–400)
RBC: 3.89 MIL/uL — ABNORMAL LOW (ref 4.22–5.81)
RDW: 13.2 % (ref 11.5–15.5)
WBC: 4.5 10*3/uL (ref 4.0–10.5)
nRBC: 0 % (ref 0.0–0.2)

## 2020-12-16 SURGERY — REPAIR, HERNIA, INGUINAL, LAPAROSCOPIC
Anesthesia: General | Laterality: Left

## 2020-12-16 MED ORDER — CHLORHEXIDINE GLUCONATE 0.12 % MT SOLN
15.0000 mL | Freq: Once | OROMUCOSAL | Status: AC
  Administered 2020-12-16: 15 mL via OROMUCOSAL

## 2020-12-16 MED ORDER — AMISULPRIDE (ANTIEMETIC) 5 MG/2ML IV SOLN: 10.0000 mg | Freq: Once | INTRAVENOUS | Status: DC | PRN

## 2020-12-16 MED ORDER — FENTANYL CITRATE (PF) 100 MCG/2ML IJ SOLN
INTRAMUSCULAR | Status: AC
  Filled 2020-12-16: qty 2

## 2020-12-16 MED ORDER — SUGAMMADEX SODIUM 200 MG/2ML IV SOLN
INTRAVENOUS | Status: DC | PRN
Start: 2020-12-16 — End: 2020-12-16
  Administered 2020-12-16: 200 mg via INTRAVENOUS

## 2020-12-16 MED ORDER — FENTANYL CITRATE (PF) 100 MCG/2ML IJ SOLN
INTRAMUSCULAR | Status: DC | PRN
  Administered 2020-12-16 (×3): 50 ug via INTRAVENOUS
  Administered 2020-12-16: 100 ug via INTRAVENOUS
  Administered 2020-12-16 (×2): 50 ug via INTRAVENOUS

## 2020-12-16 MED ORDER — GABAPENTIN 300 MG PO CAPS
300.0000 mg | ORAL_CAPSULE | ORAL | Status: AC
  Administered 2020-12-16: 300 mg via ORAL
  Filled 2020-12-16: qty 1

## 2020-12-16 MED ORDER — PHENYLEPHRINE 40 MCG/ML (10ML) SYRINGE FOR IV PUSH (FOR BLOOD PRESSURE SUPPORT)
PREFILLED_SYRINGE | INTRAVENOUS | Status: DC | PRN
  Administered 2020-12-16: 80 ug via INTRAVENOUS
  Administered 2020-12-16: 120 ug via INTRAVENOUS
  Administered 2020-12-16: 80 ug via INTRAVENOUS

## 2020-12-16 MED ORDER — CHLORHEXIDINE GLUCONATE CLOTH 2 % EX PADS: 6.0000 | MEDICATED_PAD | Freq: Once | CUTANEOUS | Status: DC

## 2020-12-16 MED ORDER — FENTANYL CITRATE PF 50 MCG/ML IJ SOSY
PREFILLED_SYRINGE | INTRAMUSCULAR | Status: AC
  Filled 2020-12-16: qty 1

## 2020-12-16 MED ORDER — ENSURE PRE-SURGERY PO LIQD
296.0000 mL | Freq: Once | ORAL | Status: DC
  Filled 2020-12-16: qty 296

## 2020-12-16 MED ORDER — PHENYLEPHRINE HCL (PRESSORS) 10 MG/ML IV SOLN
INTRAVENOUS | Status: AC
  Filled 2020-12-16: qty 2

## 2020-12-16 MED ORDER — FENTANYL CITRATE PF 50 MCG/ML IJ SOSY
25.0000 ug | PREFILLED_SYRINGE | INTRAMUSCULAR | Status: DC | PRN
  Administered 2020-12-16 (×3): 50 ug via INTRAVENOUS

## 2020-12-16 MED ORDER — 0.9 % SODIUM CHLORIDE (POUR BTL) OPTIME
TOPICAL | Status: DC | PRN
  Administered 2020-12-16: 2000 mL

## 2020-12-16 MED ORDER — BUPIVACAINE-EPINEPHRINE 0.25% -1:200000 IJ SOLN
INTRAMUSCULAR | Status: DC | PRN
  Administered 2020-12-16: 60 mL

## 2020-12-16 MED ORDER — ONDANSETRON HCL 4 MG PO TABS: 4.0000 mg | ORAL_TABLET | Freq: Three times a day (TID) | ORAL | 5 refills | Status: DC | PRN

## 2020-12-16 MED ORDER — LACTATED RINGERS IV SOLN: INTRAVENOUS | Status: DC

## 2020-12-16 MED ORDER — ROCURONIUM BROMIDE 10 MG/ML (PF) SYRINGE
PREFILLED_SYRINGE | INTRAVENOUS | Status: DC | PRN
  Administered 2020-12-16: 50 mg via INTRAVENOUS
  Administered 2020-12-16: 30 mg via INTRAVENOUS
  Administered 2020-12-16: 20 mg via INTRAVENOUS
  Administered 2020-12-16: 30 mg via INTRAVENOUS

## 2020-12-16 MED ORDER — CEFAZOLIN SODIUM-DEXTROSE 2-4 GM/100ML-% IV SOLN
2.0000 g | INTRAVENOUS | Status: AC
  Administered 2020-12-16: 2 g via INTRAVENOUS
  Filled 2020-12-16: qty 100

## 2020-12-16 MED ORDER — LACTATED RINGERS IR SOLN
Status: DC | PRN
  Administered 2020-12-16: 1000 mL

## 2020-12-16 MED ORDER — TRAMADOL HCL 50 MG PO TABS: 50.0000 mg | ORAL_TABLET | Freq: Four times a day (QID) | ORAL | 0 refills | Status: DC | PRN

## 2020-12-16 MED ORDER — ACETAMINOPHEN 500 MG PO TABS
1000.0000 mg | ORAL_TABLET | ORAL | Status: AC
  Administered 2020-12-16: 1000 mg via ORAL
  Filled 2020-12-16: qty 2

## 2020-12-16 MED ORDER — DEXAMETHASONE SODIUM PHOSPHATE 10 MG/ML IJ SOLN
INTRAMUSCULAR | Status: DC | PRN
  Administered 2020-12-16: 5 mg via INTRAVENOUS

## 2020-12-16 MED ORDER — FENTANYL CITRATE (PF) 250 MCG/5ML IJ SOLN
INTRAMUSCULAR | Status: AC
  Filled 2020-12-16: qty 5

## 2020-12-16 MED ORDER — LIDOCAINE 2% (20 MG/ML) 5 ML SYRINGE
INTRAMUSCULAR | Status: DC | PRN
  Administered 2020-12-16: 60 mg via INTRAVENOUS

## 2020-12-16 MED ORDER — LIDOCAINE 2% (20 MG/ML) 5 ML SYRINGE
INTRAMUSCULAR | Status: DC | PRN
  Administered 2020-12-16: 1.5 mg/kg/h via INTRAVENOUS

## 2020-12-16 MED ORDER — ORAL CARE MOUTH RINSE: 15.0000 mL | Freq: Once | OROMUCOSAL | Status: AC

## 2020-12-16 MED ORDER — ONDANSETRON HCL 4 MG/2ML IJ SOLN
INTRAMUSCULAR | Status: DC | PRN
  Administered 2020-12-16: 4 mg via INTRAVENOUS

## 2020-12-16 MED ORDER — BUPIVACAINE-EPINEPHRINE (PF) 0.25% -1:200000 IJ SOLN
INTRAMUSCULAR | Status: AC
  Filled 2020-12-16: qty 60

## 2020-12-16 MED ORDER — PROPOFOL 10 MG/ML IV BOLUS
INTRAVENOUS | Status: DC | PRN
  Administered 2020-12-16: 160 mg via INTRAVENOUS

## 2020-12-16 MED ORDER — BUPIVACAINE LIPOSOME 1.3 % IJ SUSP
INTRAMUSCULAR | Status: DC | PRN
  Administered 2020-12-16: 20 mL

## 2020-12-16 SURGICAL SUPPLY — 41 items
BAG COUNTER SPONGE SURGICOUNT (BAG) IMPLANT
BAG SPNG CNTER NS LX DISP (BAG)
CABLE HIGH FREQUENCY MONO STRZ (ELECTRODE) ×4 IMPLANT
CHLORAPREP W/TINT 26 (MISCELLANEOUS) ×2 IMPLANT
COVER SURGICAL LIGHT HANDLE (MISCELLANEOUS) ×2 IMPLANT
DECANTER SPIKE VIAL GLASS SM (MISCELLANEOUS) ×2 IMPLANT
DEVICE SECURE STRAP 25 ABSORB (INSTRUMENTS) IMPLANT
DRAPE WARM FLUID 44X44 (DRAPES) ×2 IMPLANT
DRSG TEGADERM 2-3/8X2-3/4 SM (GAUZE/BANDAGES/DRESSINGS) ×2 IMPLANT
DRSG TEGADERM 4X4.75 (GAUZE/BANDAGES/DRESSINGS) ×2 IMPLANT
ELECT REM PT RETURN 15FT ADLT (MISCELLANEOUS) ×2 IMPLANT
GAUZE SPONGE 2X2 8PLY STRL LF (GAUZE/BANDAGES/DRESSINGS) ×1 IMPLANT
GLOVE SURG NEOPR MICRO LF SZ8 (GLOVE) ×2 IMPLANT
GLOVE SURG UNDER LTX SZ8 (GLOVE) ×2 IMPLANT
GOWN STRL REUS W/TWL XL LVL3 (GOWN DISPOSABLE) ×4 IMPLANT
IRRIG SUCT STRYKERFLOW 2 WTIP (MISCELLANEOUS)
IRRIGATION SUCT STRKRFLW 2 WTP (MISCELLANEOUS) IMPLANT
KIT BASIN OR (CUSTOM PROCEDURE TRAY) ×2 IMPLANT
KIT TURNOVER KIT A (KITS) ×2 IMPLANT
MESH HERNIA 6X6 BARD (Mesh General) ×1 IMPLANT
MESH HERNIA BARD 6X6 (Mesh General) ×1 IMPLANT
MESH ULTRAPRO 6X6 15CM15CM (Mesh General) ×4 IMPLANT
NEEDLE INSUFFLATION 14GA 120MM (NEEDLE) IMPLANT
NEEDLE SPNL 22GX3.5 QUINCKE BK (NEEDLE) ×2 IMPLANT
PAD POSITIONING PINK XL (MISCELLANEOUS) ×2 IMPLANT
SCISSORS LAP 5X35 DISP (ENDOMECHANICALS) ×4 IMPLANT
SET TUBE SMOKE EVAC HIGH FLOW (TUBING) ×2 IMPLANT
SLEEVE ADV FIXATION 5X100MM (TROCAR) ×2 IMPLANT
SPONGE GAUZE 2X2 8PLY STRL LF (GAUZE/BANDAGES/DRESSINGS) ×2 IMPLANT
SPONGE GAUZE 2X2 STER 10/PKG (GAUZE/BANDAGES/DRESSINGS) ×1
SUT MNCRL AB 4-0 PS2 18 (SUTURE) ×2 IMPLANT
SUT PDS AB 1 CT1 27 (SUTURE) ×4 IMPLANT
SUT VIC AB 2-0 SH 27 (SUTURE) ×2
SUT VIC AB 2-0 SH 27X BRD (SUTURE) ×1 IMPLANT
SUT VICRYL 0 UR6 27IN ABS (SUTURE) ×4 IMPLANT
TACKER 5MM HERNIA 3.5CML NAB (ENDOMECHANICALS) IMPLANT
TOWEL OR 17X26 10 PK STRL BLUE (TOWEL DISPOSABLE) ×2 IMPLANT
TOWEL OR NON WOVEN STRL DISP B (DISPOSABLE) ×2 IMPLANT
TRAY LAPAROSCOPIC (CUSTOM PROCEDURE TRAY) ×2 IMPLANT
TROCAR ADV FIXATION 5X100MM (TROCAR) ×2 IMPLANT
TROCAR XCEL BLUNT TIP 100MML (ENDOMECHANICALS) ×2 IMPLANT

## 2020-12-16 NOTE — Telephone Encounter (Signed)
Clearance date was marked as TBD rather than urgent. Pt is already having procedure today.

## 2020-12-16 NOTE — Interval H&P Note (Signed)
History and Physical Interval Note:  12/16/2020 1:00 PM  Zachary Sanchez  has presented today for surgery, with the diagnosis of LEFT AND POSSIBLE RIGHT INGUINAL HERNIAS.  The various methods of treatment have been discussed with the patient and family. After consideration of risks, benefits and other options for treatment, the patient has consented to  Procedure(s) with comments: LAPAROSCOPIC LEFT AND POSSIBLE RIGHT INGUINAL HERNIA REPAIR (Left) - GEN AND LOCAL as a surgical intervention.  The patient's history has been reviewed, patient examined, no change in status, stable for surgery.  I have reviewed the patient's chart and labs.  Questions were answered to the patient's satisfaction.    I have re-reviewed the the patient's records, history, medications, and allergies.  I have re-examined the patient.  I again discussed intraoperative plans and goals of post-operative recovery.  The patient agrees to proceed.  Zachary Sanchez  04/08/1946 [id number]  Patient Care Team: Derinda Late, MD as PCP - General (Family Medicine) Thompson Grayer, MD as PCP - Electrophysiology (Cardiology) Thompson Grayer, MD as PCP - Cardiology (Cardiology) Michael Boston, MD as Consulting Physician (General Surgery) Richmond Campbell, MD as Consulting Physician (Gastroenterology)  Patient Active Problem List   Diagnosis Date Noted   Chronic cholecystitis with calculus 09/04/2020   Current use of long term anticoagulation 09/04/2020   Hyponatremia 03/24/2016   Pulmonary emboli ([place]) 03/23/2016   S/P knee replacement 03/04/2016   Prostate cancer ([place]) 07/09/2015   Aortic root enlargement ([place]) 02/25/2013   Permanent atrial fibrillation ([place]) 12/12/2008   COLONIC POLYPS 09/25/2008   HYPERLIPIDEMIA 09/25/2008   GOUT 09/25/2008   BENIGN PROSTATIC HYPERTROPHY, HX OF 09/25/2008    Past Medical History:  Diagnosis Date   Aortic root enlargement (HCC)    aortic root 72m in size   Atrial flutter (HCC)    Biatrial  enlargement    Colonic polyp    DJD (degenerative joint disease)    DVT (deep venous thrombosis) ([place] 03/1016   post knee surgery, on eliquis   Dysrhythmia    a fib   Gout    Heart murmur    when younger   History of benign prostatic hypertrophy    Hyperlipidemia    [place]    longstanding persistent, asymptomatic   Pneumonia    [place]    Pulmonary emboli ([place] 03/2016   from DVT post knee surgery   Rupture of quadriceps tendon 05/10/2017   Wears glasses     Past Surgical History:  Procedure Laterality Date   FRACTURE SURGERY     ankles and fingers   KNEE SURGERY     right; menicus tear    LAPAROSCOPIC CHOLECYSTECTOMY SINGLE SITE WITH INTRAOPERATIVE CHOLANGIOGRAM N/A 09/04/2020   Procedure: LAPAROSCOPIC CHOLECYSTECTOMY SINGLE SITE WITH INTRAOPERATIVE CHOLANGIOGRAM BILATERAL TAP BLOCK;  Surgeon: GMichael Boston MD;  Location: [facility];  Service: General;  Laterality: N/A;   LYMPHADENECTOMY Bilateral 07/09/2015   Procedure: PELVIC LYMPHADENECTOMY;  Surgeon: LRaynelle Bring MD;  Location: [facility];  Service: Urology;  Laterality: Bilateral;   QUADRICEPS REPAIR     ROBOT ASSISTED LAPAROSCOPIC RADICAL PROSTATECTOMY N/A 07/09/2015   Procedure: XI ROBOTIC ASSISTED LAPAROSCOPIC RADICAL PROSTATECTOMY LEVEL 2;  Surgeon: LRaynelle Bring MD;  Location: [facility];  Service: Urology;  Laterality: N/A;   TONSILLECTOMY     TOTAL KNEE ARTHROPLASTY Right 03/04/2016   Procedure: RIGHT TOTAL KNEE ARTHROPLASTY;  Surgeon: RSydnee Cabal MD;  Location: [facility];  Service: Orthopedics;  Laterality: Right;  Social History   Socioeconomic History   Marital status: Married    Spouse name: Not on file   Number of children: Not on file   Years of education: Not on file   Highest education level: Not on file  Occupational History   Occupation: Engineer, maintenance (IT)    Employer: [employer]  Tobacco Use   Smoking status: Former    Packs/day: 1.00    Years: 20.00     Pack years: 20.00    Types: Cigarettes    Quit date: 02/11/1979    Years since quitting: 41.8   Smokeless tobacco: Never  Vaping Use   Vaping Use: Never used  Substance and Sexual Activity   Alcohol use: Yes    Alcohol/week: 21.0 standard drinks    Types: 21 Glasses of wine per week    Comment: 2-3 glasses of wine daily   Drug use: No   Sexual activity: Not Currently  Other Topics Concern   Not on file  Social History Narrative   Not on file   Social Determinants of Health   Financial Resource Strain: Not on file  Food Insecurity: Not on file  Transportation Needs: Not on file  Physical Activity: Not on file  Stress: Not on file  Social Connections: Not on file  Intimate Partner Violence: Not on file    Family History  Problem Relation Age of Onset   Heart attack Father    Alcohol abuse Father        Heavy smoker and drinker   Cancer Brother        base of tongue    Medications Prior to Admission  Medication Sig Dispense Refill Last Dose   allopurinol (ZYLOPRIM) 300 MG tablet Take 300 mg by mouth daily.   12/15/2020   cholecalciferol (VITAMIN D) 1000 units tablet Take 2,000 Units by mouth daily.   12/15/2020   colestipol (COLESTID) 1 g tablet Take 1-2 g by mouth daily.   12/15/2020   ezetimibe (ZETIA) 10 MG tablet Take 10 mg by mouth daily.   12/15/2020   Glucosamine-Chondroitin (GLUCOSAMINE CHONDR COMPLEX PO) Take 2 tablets by mouth daily.   12/15/2020   ibuprofen (ADVIL) 200 MG tablet Take 400 mg by mouth every 6 (six) hours as needed for mild pain or headache.   Past Month   rivaroxaban (XARELTO) 20 MG TABS tablet Take 1 tablet (20 mg total) by mouth daily with supper. 90 tablet 3 12/13/2020 at 0800   rosuvastatin (CRESTOR) 40 MG tablet Take 1 tablet by mouth daily.   12/15/2020   zolpidem (AMBIEN) 10 MG tablet Take 5 mg by mouth at bedtime as needed for sleep.   12/15/2020    Current Facility-Administered Medications  Medication Dose Route Frequency Provider Last  Rate Last Admin   bupivacaine liposome (EXPAREL) 1.3 % injection 266 mg  20 mL Infiltration On Call to OR Michael Boston, MD       ceFAZolin (ANCEF) IVPB 2g/100 mL premix  2 g Intravenous On Call to OR Michael Boston, MD       Chlorhexidine Gluconate Cloth 2 % PADS 6 each  6 each Topical Once Michael Boston, MD       And   Chlorhexidine Gluconate Cloth 2 % PADS 6 each  6 each Topical Once Michael Boston, MD       Derrill Memo ON 12/17/2020] feeding supplement (ENSURE PRE-SURGERY) liquid 296 mL  296 mL Oral Once Michael Boston, MD       lactated  ringers infusion   Intravenous Continuous Myrtie Soman, MD 10 mL/hr at 12/16/20 1200 Continued from Pre-op at 12/16/20 1200     No Known Allergies  BP (!) 140/102   Pulse (!) 58   Temp 97.9 F (36.6 C) (Oral)   Resp 17   SpO2 99%   Labs: Results for orders placed or performed during the hospital encounter of 12/16/20 (from the past 48 hour(s))  CBC     Status: Abnormal   Collection Time: 12/16/20 11:15 AM  Result Value Ref Range   WBC 4.5 4.0 - 10.5 K/uL   RBC 3.89 (L) 4.22 - 5.81 MIL/uL   Hemoglobin 13.6 13.0 - 17.0 g/dL   HCT 39.6 39.0 - 52.0 %   MCV 101.8 (H) 80.0 - 100.0 fL   MCH 35.0 (H) 26.0 - 34.0 pg   MCHC 34.3 30.0 - 36.0 g/dL   RDW 13.2 11.5 - 15.5 %   Platelets 186 150 - 400 K/uL   nRBC 0.0 0.0 - 0.2 %    Comment: Performed at [facility], [place] [address], [place], [place] [address]    Imaging / Studies: CT ABDOMEN PELVIS W CONTRAST  Result Date: 12/03/2020 CLINICAL DATA:  Lower abdominal pain. Nausea and cramping for 6 months. EXAM: CT ABDOMEN AND PELVIS WITH CONTRAST TECHNIQUE: Multidetector CT imaging of the abdomen and pelvis was performed using the standard protocol following bolus administration of intravenous contrast. CONTRAST:  175m ISOVUE-300 IOPAMIDOL (ISOVUE-300) INJECTION 61% COMPARISON:  None. FINDINGS: Lower chest: No acute abnormality. Hepatobiliary: Subjective hepatic steatosis. No focal liver  abnormality. Cholecystectomy. No bile duct dilatation. Pancreas: Unremarkable. No pancreatic ductal dilatation or surrounding inflammatory changes. Spleen: Normal in size without focal abnormality. Adrenals/Urinary Tract: Normal adrenal glands. Stone within upper pole of left kidney measures 2 mm, image 31/2. No mass or hydronephrosis identified bilaterally. The urinary bladder is unremarkable. Stomach/Bowel: Stomach appears normal. There is a rather elongated cecum which extends into the ventral pelvis across the midline of the abdomen and into a left inguinal hernia. Normal location of the duodenal jejunal junction. Beyond the ligament of Treitz however the jejunal bowel loops cross the midline into the right upper quadrant of the abdomen and appear lateral to the distal ascending colon, image 37/2. Suspicious for non obstructing internal hernia. There are no signs of bowel wall thickening, inflammation or distension. No evidence for bowel obstruction. Vascular/Lymphatic: Aortic atherosclerosis. No aneurysm. No abdominopelvic adenopathy. Reproductive: The prostate gland is not visualized and may be surgically absent. Other: No ascites or focal fluid collections. Small fat containing supraumbilical hernia is identified measuring 3 cm, image 104/8. Musculoskeletal: Multilevel lumbar spondylosis. No acute or suspicious osseous findings. IMPRESSION: 1. No acute findings identified within the abdomen or pelvis. 2. There is a rather elongated cecum which extends into the ventral pelvis, across the midline of the abdomen and into a large left inguinal hernia. There are no signs of bowel obstruction at this time. 3. Suspect nonobstructing small bowel involving the ascending mesocolon. This contains nondilated loops of small bowel. 4. Small fat containing supraumbilical hernia is identified measuring 3 cm. 5. Nonobstructing left renal calculus. 6. Subjective hepatic steatosis. 7. Aortic atherosclerosis. Aortic  Atherosclerosis (ICD10-I70.0). Electronically Signed   By: TKerby MoorsM.D.   On: 12/03/2020 12:40     .SAdin Hector M.D., F.A.C.S. Gastrointestinal and Minimally Invasive [facility] [address] [place] [phone] / Paging  12/16/2020 1:00 PM  Adin Hector

## 2020-12-16 NOTE — Transfer of Care (Signed)
Immediate Anesthesia Transfer of Care Note  Patient: Zachary Sanchez  Procedure(s) Performed: LAPAROSCOPIC LEFT AND  RIGHT INGUINAL HERNIA REPAIR; TAP BLOCKS (Left)  Patient Location: PACU  Anesthesia Type:General  Level of Consciousness: sedated, patient cooperative and responds to stimulation  Airway & Oxygen Therapy: Patient Spontanous Breathing and Patient connected to face mask oxygen  Post-op Assessment: Report given to RN and Post -op Vital signs reviewed and stable  Post vital signs: Reviewed and stable  Last Vitals:  Vitals Value Taken Time  BP    Temp    Pulse    Resp    SpO2      Last Pain:  Vitals:   12/16/20 1100  TempSrc:   PainSc: 1       Patients Stated Pain Goal: 3 ([id number])  Complications: No notable events documented.

## 2020-12-16 NOTE — Discharge Instructions (Addendum)
Restart your Xarelto anticoagulation on Friday, 2 days postop  HERNIA REPAIR: POST OP INSTRUCTIONS  ######################################################################  EAT Gradually transition to a high fiber diet with a fiber supplement over the next few weeks after discharge.  Start with a pureed / full liquid diet (see below)  WALK Walk an hour a day.  Control your pain to do that.    CONTROL PAIN Control pain so that you can walk, sleep, tolerate sneezing/coughing, and go up/down stairs.  HAVE A BOWEL MOVEMENT DAILY Keep your bowels regular to avoid problems.  OK to try a laxative to override constipation.  OK to use an antidairrheal to slow down diarrhea.  Call if not better after 2 tries  CALL IF YOU HAVE PROBLEMS/CONCERNS Call if you are still struggling despite following these instructions. Call if you have concerns not answered by these instructions  ######################################################################    DIET: Follow a light bland diet & liquids the first 24 hours after arrival home, such as soup, liquids, starches, etc.  Be sure to drink plenty of fluids.  Quickly advance to a usual solid diet within a few days.  Avoid fast food or heavy meals as your are more likely to get nauseated or have irregular bowels.  A low-fat, high-fiber diet for the rest of your life is ideal.   Take your usually prescribed home medications unless otherwise directed.  PAIN CONTROL: Pain is best controlled by a usual combination of three different methods TOGETHER: Ice/Heat Over the counter pain medication Prescription pain medication Most patients will experience some swelling and bruising around the hernia(s) such as the bellybutton, groins, or old incisions.  Ice packs or heating pads (30-60 minutes up to 6 times a day) will help. Use ice for the first few days to help decrease swelling and bruising, then switch to heat to help relax tight/sore spots and speed  recovery.  Some people prefer to use ice alone, heat alone, alternating between ice & heat.  Experiment to what works for you.  Swelling and bruising can take several weeks to resolve.   It is helpful to take an over-the-counter pain medication regularly for the first few weeks.  Choose one of the following that works best for you: Naproxen (Aleve, etc)  Two '220mg'$  tabs twice a day Ibuprofen (Advil, etc) Three '200mg'$  tabs four times a day (every meal & bedtime) Acetaminophen (Tylenol, etc) 325-'650mg'$  four times a day (every meal & bedtime) A  prescription for pain medication should be given to you upon discharge.  Take your pain medication as prescribed.  If you are having problems/concerns with the prescription medicine (does not control pain, nausea, vomiting, rash, itching, etc), please call us [phone] to see if we need to switch you to a different pain medicine that will work better for you and/or control your side effect better. If you need a refill on your pain medication, please contact your pharmacy.  They will contact our office to request authorization. Prescriptions will not be filled after 5 pm or on week-ends.  Avoid getting constipated.  Between the surgery and the pain medications, it is common to experience some constipation.  Increasing fluid intake and taking a fiber supplement (such as Metamucil, Citrucel, FiberCon, MiraLax, etc) 1-2 times a day regularly will usually help prevent this problem from occurring.  A mild laxative (prune juice, Milk of Magnesia, MiraLax, etc) should be taken according to package directions if there are no bowel movements after 48 hours.    Wash /  shower every day.  You may shower over the dressings as they are waterproof.    Remove your waterproof bandages, skin tapes, and other bandages 3 days after surgery. You may replace a dressing/Band-Aid to cover the incision for comfort if you wish. You may leave the incisions open to air.  You may replace a  dressing/Band-Aid to cover an incision for comfort if you wish.  Continue to shower over incision(s) after the dressing is off.  ACTIVITIES as tolerated:   You may resume regular (light) daily activities beginning the next day--such as daily self-care, walking, climbing stairs--gradually increasing activities as tolerated.  Control your pain so that you can walk an hour a day.  If you can walk 30 minutes without difficulty, it is safe to try more intense activity such as jogging, treadmill, bicycling, low-impact aerobics, swimming, etc. Save the most intensive and strenuous activity for last such as sit-ups, heavy lifting, contact sports, etc  Refrain from any heavy lifting or straining until you are off narcotics for pain control.   DO NOT PUSH THROUGH PAIN.  Let pain be your guide: If it hurts to do something, don't do it.  Pain is your body warning you to avoid that activity for another week until the pain goes down. You may drive when you are no longer taking prescription pain medication, you can comfortably wear a seatbelt, and you can safely maneuver your car and apply brakes. You may have sexual intercourse when it is comfortable.   FOLLOW UP in our office Please call [facility] at [phone] to set up an appointment to see your surgeon in the office for a follow-up appointment approximately 2-3 weeks after your surgery. Make sure that you call for this appointment the day you arrive home to insure a convenient appointment time.  9.  If you have disability of FMLA / Family leave forms, please bring the forms to the office for processing.  (do not give to your surgeon).  WHEN TO CALL us [phone]: Poor pain control Reactions / problems with new medications (rash/itching, nausea, etc)  Fever over 101.5 F (38.5 C) Inability to urinate Nausea and/or vomiting Worsening swelling or bruising Continued bleeding from incision. Increased pain, redness, or drainage from the incision   The  clinic staff is available to answer your questions during regular business hours (8:30am-5pm).  Please don't hesitate to call and ask to speak to one of our nurses for clinical concerns.   If you have a medical emergency, go to the nearest emergency room or call 911.  A surgeon from [facility] is always on call at the hospitals in [facility], [place], [place], [place], [place]  [address] ?  [address] [address], [place], [place]   [address] MAIN: [phone] ? TOLL FREE: [phone] ? FAX: [phone] [email or web address]

## 2020-12-16 NOTE — Anesthesia Preprocedure Evaluation (Signed)
Anesthesia Evaluation  Patient identified by MRN, date of birth, ID band Patient awake    Reviewed: Allergy & Precautions, NPO status , Patient's Chart, lab work & pertinent test results  Airway Mallampati: II  TM Distance: >3 FB Neck ROM: Full    Dental  (+) Dental Advisory Given   Pulmonary former smoker, PE   breath sounds clear to auscultation       Cardiovascular + DVT   Rhythm:Regular Rate:Normal     Neuro/Psych negative neurological ROS     GI/Hepatic negative GI ROS, Neg liver ROS,   Endo/Other  negative endocrine ROS  Renal/GU negative Renal ROS     Musculoskeletal  (+) Arthritis ,   Abdominal   Peds  Hematology negative hematology ROS (+)   Anesthesia Other Findings   Reproductive/Obstetrics                             Lab Results  Component Value Date   WBC 4.5 12/16/2020   HGB 13.6 12/16/2020   HCT 39.6 12/16/2020   MCV 101.8 (H) 12/16/2020   PLT 186 12/16/2020   Lab Results  Component Value Date   CREATININE 0.71 08/31/2020   BUN 17 08/31/2020   NA 138 08/31/2020   K 4.5 08/31/2020   CL 106 08/31/2020   CO2 26 08/31/2020    Anesthesia Physical Anesthesia Plan  ASA: 3  Anesthesia Plan: General   Post-op Pain Management:    Induction: Intravenous  PONV Risk Score and Plan: 2 and Dexamethasone, Ondansetron and Treatment may vary due to age or medical condition  Airway Management Planned: Oral ETT  Additional Equipment: None  Intra-op Plan:   Post-operative Plan: Extubation in OR  Informed Consent: I have reviewed the patients History and Physical, chart, labs and discussed the procedure including the risks, benefits and alternatives for the proposed anesthesia with the patient or authorized representative who has indicated his/her understanding and acceptance.     Dental advisory given  Plan Discussed with: CRNA  Anesthesia Plan Comments:          Anesthesia Quick Evaluation

## 2020-12-16 NOTE — Anesthesia Procedure Notes (Signed)
Procedure Name: Intubation Date/Time: 12/16/2020 2:02 PM Performed by: Gean Maidens, CRNA Pre-anesthesia Checklist: Patient identified, Emergency Drugs available, Suction available, Patient being monitored and Timeout performed Patient Re-evaluated:Patient Re-evaluated prior to induction Oxygen Delivery Method: Circle system utilized Preoxygenation: Pre-oxygenation with 100% oxygen Induction Type: IV induction Ventilation: Mask ventilation without difficulty Laryngoscope Size: Mac and 4 Grade View: Grade II Tube type: Oral Tube size: 7.5 mm Number of attempts: 1 Airway Equipment and Method: Stylet Placement Confirmation: ETT inserted through vocal cords under direct vision, positive ETCO2 and breath sounds checked- equal and bilateral Secured at: 23 cm Tube secured with: Tape Dental Injury: Teeth and Oropharynx as per pre-operative assessment

## 2020-12-16 NOTE — H&P (Signed)
12/16/2020    REFERRING PHYSICIAN: Self  Patient Care Team: Marylene Land, MD as PCP - General (Family Medicine) Medoff, Tawni Pummel, MD (Gastroenterology) Arta Silence, MD (Internal Medicine)  PROVIDER: Hollace Kinnier, MD  [facility] MRN: [id number] DOB: [birth year] DATE OF ENCOUNTER: 12/14/2020  Subjective   Chief Complaint: No chief complaint on file.   History of Present Illness: Zachary Sanchez is a 75 y.o. male who is seen today as an office consultation at the request of Dr. Arcelia Jew for evaluation of No chief complaint on file. .   Patient returns after I done single site cholecystectomy for him in May. Pathology showed chronic cholecystitis. Patient's had some recurrent intermittent nausea and vomiting and abdominal pain. Discussed with primary care. CAT scan done. Left inguinal hernia containing bowel. Looks like a floppy cecum from the right side going into the area. Primary care concerned. Sounds like he discussed with Dr. Paulita Fujita with gastroenterology. Surgical consultation requested. We fit him in my next available clinic.  Patient is tolerating most liquid and does not feel any triggers with eating necessarily but he gets intermittent cramping and abdominal pain and sharp pain. He denies much in the way of groin pain or swelling. He remains constipated, moving his bowels every couple of days. His alternate between Benefiber and fiber Gummies. He takes Xarelto and is followed by cardiology, particularly Dr. Rayann Heman. History of some atrial flutter and aortic root enlargement. He rides a bicycle 3 times a week. He tolerated cholecystectomy 3 months ago without any issues.  Medical History: Past Medical History:  Diagnosis Date   Hyperlipidemia   There is no problem list on file for this patient.  No past surgical history on file.   No Known Allergies  Current Outpatient Medications on File Prior to Visit  Medication Sig Dispense Refill   rivaroxaban (XARELTO) 20 mg  tablet Xarelto 20 mg tablet   rosuvastatin (CRESTOR) 40 MG tablet rosuvastatin 40 mg tablet   allopurinoL (ZYLOPRIM) 300 MG tablet allopurinol 300 mg tablet   No current facility-administered medications on file prior to visit.   No family history on file.   Social History   Tobacco Use  Smoking Status Never Smoker  Smokeless Tobacco Never Used    Social History   Socioeconomic History   Marital status: Married  Tobacco Use   Smoking status: Never Smoker   Smokeless tobacco: Never Used  Scientific laboratory technician Use: Never used  Substance and Sexual Activity   Alcohol use: Yes   Drug use: Defer   Sexual activity: Defer   ############################################################  Fraility Risk:  Review of Systems: A complete review of systems (ROS) was obtained from the patient. I have reviewed this information and discussed as appropriate with the patient. See HPI as well for other pertinent ROS.  Constitutional: No fevers, chills, sweats. Weight stable Eyes: No vision changes, No discharge HENT: No sore throats, nasal drainage Lymph: No neck swelling, No bruising easily Pulmonary: No cough, productive sputum CV: No orthopnea, PND Patient walks 60 minutes for about 2 miles without difficulty. No exertional chest/neck/shoulder/arm pain.  GI: No personal nor family history of GI/colon cancer, inflammatory bowel disease, irritable bowel syndrome, allergy such as Celiac Sprue, dietary/dairy problems, colitis, ulcers nor gastritis. No recent sick contacts/gastroenteritis. No travel outside the country. No changes in diet.  Renal: No UTIs, No hematuria Genital: No drainage, bleeding, masses Musculoskeletal: No severe joint pain. Good ROM major joints Skin: No sores or lesions Heme/Lymph: No easy bleeding.  No swollen lymph nodes  Objective:   Vitals:  12/14/20 1004  BP: 130/72  Pulse: 88  Temp: 36.4 C (97.6 F)  SpO2: 98%  Weight: 95.3 kg (210 lb 3.2 oz)  Height:  188 cm ('6\' 2"'$ )    Body mass index is 26.99 kg/m.  PHYSICAL EXAM:  Constitutional: Not cachectic. Hygeine adequate. Vitals signs as above.  Eyes: Pupils reactive, normal extraocular movements. Sclera nonicteric Neuro: CN II-XII intact. No major focal sensory defects. No major motor deficits. Lymph: No head/neck/groin lymphadenopathy Psych: No severe agitation. No severe anxiety. Judgment & insight Adequate, Oriented x4, HENT: Normocephalic, Mucus membranes moist. No thrush.  Neck: Supple, No tracheal deviation. No obvious thyromegaly Chest: No pain to chest wall compression. Good respiratory excursion. No audible wheezing CV: Pulses intact. Regular rhythm. No major extremity edema  Abdomen: Flat Hernia: Not present. Diastasis recti: Mild supraumbilical midline. Soft. Nondistended. Nontender. No hepatomegaly. No splenomegaly  Gen: Inguinal hernia: Present at: Left groin. Moderate size going down to scrotum. Reducible but sensitive. Gurgling consistent with colon found on CT scan. Smaller right inguinal hernia impulse felt. Otherwise normal external genitalia.. Inguinal lymph nodes: without lymphadenopathy.   Rectal: (Deferred)  Ext: No obvious deformity or contracture. Incisions over knees consistent with prior surgeries edema: Not present. No cyanosis Skin: No major subcutaneous nodules. Warm and dry Musculoskeletal: Severe joint rigidity not present. No obvious clubbing. No digital petechiae.   Labs, Imaging and Diagnostic Testing:  Located in [place]' section of Epic EMR chart  PRIOR NOTES   Esta Ranft Appointment: 08/10/2020 10:15 AM Location: [facility] Patient #: [id number] DOB: 04/05/1946 Married / Language: Cleophus Sanchez / Race: White Male  History of Present Illness Adin Hector MD; 08/10/2020 5:07 PM) The patient is a 75 year old male who presents for evaluation of gall stones. Note for "Gall stones": ` ` ` Patient sent for surgical consultation  at the request of Derinda Late  Chief Complaint: Abdominal pain and gallstones. ` ` The patient is a male history of atrial fibrillation on Xerelto.  No history of stroke.  Rides his bike 3 times a week.  Followed by Dr. Rayann Heman with cardiology.  His had some abdominal pain issues.  He is noted attacks starting 22 January 2020.  He'll feel nauseated with a lot of salivating and spitting up.  Retching.  Feel weak.  Gets epigastric pain.  Usually lasts for several hours.  He does not know if it necessarily is triggered by food but has had an happen within a few hours of that.  Did not really try any medications.  Was concerned and sought gastroenterology.  Had an EGD that was rather underwhelming.  No improvement with medications.  Gallbladder suspected.  Ultrasound showed stones versus polyps.  Surgical consultation offered.   He does not smoke.  No sleep apnea.  No diabetes.  Usually moves his bowels most days.  Lives in [place].  Occasionally gets his care through wait for spelled wanted have a surgeon in [place] so sent my way.  He is also a graduate from the same university is me, [employer], so he picked me  No personal nor family history of GI/colon cancer, inflammatory bowel disease, irritable bowel syndrome, allergy such as Celiac Sprue, dietary/dairy problems, colitis, ulcers nor gastritis.  No recent sick contacts/gastroenteritis.  No travel outside the country.  No changes in diet.  No dysphagia to solids or liquids.  No significant heartburn or reflux.  No melena, hematemesis, coffee ground  emesis.  No evidence of prior gastric/peptic ulceration.  (Review of systems as stated in this history (HPI) or in the review of systems.  Otherwise all other 12 point ROS are negative) ` ` ###########################################`  This patient encounter took 35 minutes today to perform the following: obtain history, perform exam, review outside records, interpret tests & imaging, counsel the  patient on their diagnosis; and, document this encounter, including findings & plan in the electronic health record (EHR).  Past Surgical History Illene Regulus, CMA; 08/10/2020 10:28 AM) Knee Surgery   Right. Prostate Surgery - Removal   Tonsillectomy   Vasectomy    Diagnostic Studies History Illene Regulus, CMA; 08/10/2020 10:28 AM) Colonoscopy   1-5 years ago  Allergies Lars Mage Spillers, CMA; 08/10/2020 10:29 AM) No Known Drug Allergies   [08/10/2020]:  Medication History Illene Regulus, CMA; 08/10/2020 10:29 AM) Allopurinol  ('300MG'$  Tablet, Oral) Active. Xarelto  ('20MG'$  Tablet, Oral) Active. Rosuvastatin Calcium  ('40MG'$  Tablet, Oral) Active. Zolpidem Tartrate  ('10MG'$  Tablet, Oral) Active. Ezetimibe  ('10MG'$  Tablet, Oral) Active. Indomethacin  ('50MG'$  Capsule, Oral) Active. Medications Reconciled   Social History Illene Regulus, CMA; 08/10/2020 10:28 AM) Alcohol use   Moderate alcohol use. Caffeine use   Carbonated beverages, Coffee. No drug use   Tobacco use   Former smoker.  Family History Illene Regulus, [place]; 08/10/2020 10:28 AM) Alcohol Abuse   Father.  Other Problems Illene Regulus, CMA; 08/10/2020 10:28 AM) Prostate Cancer   Pulmonary Embolism / Blood Clot in Legs    Review of Systems (Alisha Spillers CMA; 08/10/2020 10:28 AM) General Not Present- Appetite Loss, Chills, Fatigue, Fever, Night Sweats, Weight Gain and Weight Loss. Skin Not Present- Change in Wart/Mole, Dryness, Hives, Jaundice, New Lesions, Non-Healing Wounds, Rash and Ulcer. HEENT Not Present- Earache, Hearing Loss, Hoarseness, Nose Bleed, Oral Ulcers, Ringing in the Ears, Seasonal Allergies, Sinus Pain, Sore Throat, Visual Disturbances, Wears glasses/contact lenses and Yellow Eyes. Respiratory Not Present- Bloody sputum, Chronic Cough, Difficulty Breathing, Snoring and Wheezing. Breast Not Present- Breast Mass, Breast Pain, Nipple Discharge and Skin Changes. Cardiovascular Not Present- Chest Pain,  Difficulty Breathing Lying Down, Leg Cramps, Palpitations, Rapid Heart Rate, Shortness of Breath and Swelling of Extremities. Gastrointestinal Present- Abdominal Pain and Vomiting. Not Present- Bloating, Bloody Stool, Change in Bowel Habits, Chronic diarrhea, Constipation, Difficulty Swallowing, Excessive gas, Gets full quickly at meals, Hemorrhoids, Indigestion, Nausea and Rectal Pain. Male Genitourinary Not Present- Blood in Urine, Change in Urinary Stream, Frequency, Impotence, Nocturia, Painful Urination, Urgency and Urine Leakage. Musculoskeletal Not Present- Back Pain, Joint Pain, Joint Stiffness, Muscle Pain, Muscle Weakness and Swelling of Extremities. Neurological Not Present- Decreased Memory, Fainting, Headaches, Numbness, Seizures, Tingling, Tremor, Trouble walking and Weakness. Psychiatric Not Present- Anxiety, Bipolar, Change in Sleep Pattern, Depression, Fearful and Frequent crying. Endocrine Not Present- Cold Intolerance, Excessive Hunger, Hair Changes, Heat Intolerance, Hot flashes and New Diabetes. Hematology Not Present- Blood Thinners, Easy Bruising, Excessive bleeding, Gland problems, HIV and Persistent Infections.  Vitals (Alisha Spillers CMA; 08/10/2020 10:29 AM) 08/10/2020 10:28 AM Weight: 216.8 lb   Height: 74 in  Body Surface Area: 2.25 m   Body Mass Index: 27.84 kg/m   Pulse: 101 (Regular)    BP: 124/82(Sitting, Left Arm, Standard)  Physical Exam Adin Hector MD; 08/10/2020 5:07 PM) General Mental Status - Alert. General Appearance - Not in acute distress, Not Sickly. Orientation - Oriented X3. Hydration - Well hydrated. Voice - Normal.  Integumentary Global Assessment Upon inspection and palpation of skin surfaces of the - Axillae:  non-tender, no inflammation or ulceration, no drainage. and Distribution of scalp and body hair is normal. General Characteristics Temperature - normal warmth is noted.  Head and Neck Head - normocephalic, atraumatic with no  lesions or palpable masses. Face Global Assessment - atraumatic, no absence of expression. Neck Global Assessment - no abnormal movements, no bruit auscultated on the right, no bruit auscultated on the left, no decreased range of motion, non-tender. Trachea - midline. Thyroid Gland Characteristics - non-tender.  Eye Eyeball - Left - Extraocular movements intact, No Nystagmus - Left. Eyeball - Right - Extraocular movements intact, No Nystagmus - Right. Cornea - Left - No Hazy - Left. Cornea - Right - No Hazy - Right. Sclera/Conjunctiva - Left - No scleral icterus, No Discharge - Left. Sclera/Conjunctiva - Right - No scleral icterus, No Discharge - Right. Pupil - Left - Direct reaction to light normal. Pupil - Right - Direct reaction to light normal.  ENMT Ears Pinna - Left - no drainage observed, no generalized tenderness observed. Pinna - Right - no drainage observed, no generalized tenderness observed. Nose and Sinuses External Inspection of the Nose - no destructive lesion observed. Inspection of the nares - Left - quiet respiration. Inspection of the nares - Right - quiet respiration. Mouth and Throat Lips - Upper Lip - no fissures observed, no pallor noted. Lower Lip - no fissures observed, no pallor noted. Nasopharynx - no discharge present. Oral Cavity/Oropharynx - Tongue - no dryness observed. Oral Mucosa - no cyanosis observed. Hypopharynx - no evidence of airway distress observed.  Chest and Lung Exam Inspection Movements - Normal and Symmetrical. Accessory muscles - No use of accessory muscles in breathing. Palpation Palpation of the chest reveals - Non-tender. Auscultation Breath sounds - Normal and Clear.  Cardiovascular Auscultation Rhythm - Regular. Murmurs & Other Heart Sounds - Auscultation of the heart reveals - No Murmurs and No Systolic Clicks.  Abdomen Inspection Inspection of the abdomen reveals - No Visible peristalsis and No Abnormal pulsations.  Umbilicus - No Bleeding, No Urine drainage. Palpation/Percussion Palpation and Percussion of the abdomen reveal - Soft, Non Tender, No Rebound tenderness, No Rigidity (guarding) and No Cutaneous hyperesthesia. Note:  Abdomen soft.  Nontender.  Not distended.  No umbilical or incisional hernias.  No guarding.  Male Genitourinary Sexual Maturity Tanner 5 - Adult hair pattern and Adult penile size and shape.  Peripheral Vascular Upper Extremity Inspection - Left - No Cyanotic nailbeds - Left, Not Ischemic. Inspection - Right - No Cyanotic nailbeds - Right, Not Ischemic.  Neurologic Neurologic evaluation reveals  - normal attention span and ability to concentrate, able to name objects and repeat phrases. Appropriate fund of knowledge , normal sensation and normal coordination. Mental Status Affect - not angry, not paranoid. Cranial Nerves - Normal Bilaterally. Gait - Normal.  Neuropsychiatric Mental status exam performed with findings of - able to articulate well with normal speech/language, rate, volume and coherence, thought content normal with ability to perform basic computations and apply abstract reasoning and no evidence of hallucinations, delusions, obsessions or homicidal/suicidal ideation.  Musculoskeletal Global Assessment Spine, Ribs and Pelvis - no instability, subluxation or laxity. Right Upper Extremity - no instability, subluxation or laxity.  Lymphatic Head & Neck  General Head & Neck Lymphatics: Bilateral - Description - No Localized lymphadenopathy. Axillary  General Axillary Region: Bilateral - Description - No Localized lymphadenopathy. Femoral & Inguinal  Generalized Femoral & Inguinal Lymphatics: Left - Description - No Localized lymphadenopathy. Right - Description - No  Localized lymphadenopathy.  Assessment & Plan Adin Hector MD; 08/10/2020 5:08 PM) CHRONIC CHOLECYSTITIS WITH CALCULUS (K80.10) Impression: Episodic epigastric pain with nausea vomiting  and hypersalivation most likely of biliary etiology. No classic fruit trigger is always spent rest of the discharge diagnosis seems underwhelming.  Given the fact that he seen gastroenterology was underwhelming EGD and good cardiac function, I'm skeptical of any other etiologies. Some chronic gastritis but has not tried any PPIs. I recommend he reconsider that and recheck his gastroenterologist. However I'm skeptical that his episodes are due to gastritis. He does have gallstones.  Her cholecystectomy since he keeps having attacks. I long discussion with him. He is interested in proceeding. He is hoping to get this done of the recovered for his trip to [place] in late June. Did caution there is a chance this does not solve all his problems or his major symptoms but I feel like we have done her due diligence to rule out other etiologies. He agrees. Current Plans You are being scheduled for surgery - Our schedulers will call you.   You should hear from our office's scheduling department within 5 working days about the location, date, and time of surgery.  We try to make accommodations for patient's preferences in scheduling surgery, but sometimes the OR schedule or the surgeon's schedule prevents [place] from making those accommodations.  If you have not heard from our office [phone]) in 5 working days, call the office and ask for your surgeon's nurse.  If you have other questions about your diagnosis, plan, or surgery, call the office and ask for your surgeon's nurse.  Written instructions provided Pt Education - Pamphlet Given - Laparoscopic Gallbladder Surgery: discussed with patient and provided information. The anatomy & physiology of hepatobiliary & pancreatic function was discussed.  The pathophysiology of gallbladder dysfunction was discussed.  Natural history risks without surgery was discussed.   I feel the risks of no intervention will lead to serious problems that outweigh the operative  risks; therefore, I recommended cholecystectomy to remove the pathology.  I explained laparoscopic techniques with possible need for an open approach.  Probable cholangiogram to evaluate the bilary tract was explained as well.   Risks such as bleeding, infection, abscess, leak, injury to other organs, need for further treatment, heart attack, death, and other risks were discussed.  I noted a good likelihood this will help address the problem.  Possibility that this will not correct all abdominal symptoms was explained.  Goals of post-operative recovery were discussed as well.  We will work to minimize complications.  An educational handout further explaining the pathology and treatment options was given as well.  Questions were answered.  The patient expresses understanding & wishes to proceed with surgery.  Pt Education - [facility]CS Laparosopic Post Op HCI (Bryella Diviney) Pt Education - CCS Good Bowel Health (Anamika Kueker) Pt Education - Laparoscopic Cholecystectomy: gallbladder ANTICOAGULATED (Z79.01) Impression: On Xerelto for chronic atrial fibrillation. He denies gag good exercise tolerance, I'm pretty sure anesthesia would like cardiac clearance. Most likely they will feel comfortable holding Xerelto 2 days preop and resuming it a day or 2 afterwards if things go well. Like to double check that. Current Plans I recommended obtaining preoperative cardiac clearance.  I am concerned about the health of the patient and the ability to tolerate the operation.  Therefore, we will request clearance by cardiology to better assess operative risk & see if a reevaluation, further workup, etc is needed.  Also recommendations on how medications such  as for anticoagulation and blood pressure should be managed/held/restarted after surgery. Pt Education - CCS Hold anticoagulation preoperatively CHRONIC A-FIB (I48.20)  Signed electronically by Adin Hector, MD (08/10/2020 5:08 PM)  #######################################  Zachary Sanchez Appointment: 09/21/2020 2:15 PM Location: [facility] Patient #: [id number] DOB: 03-11-1946 Married / Language: Cleophus Sanchez / Race: White Male  History of Present Illness Adin Hector MD; 09/21/2020 5:15 PM) The patient is a 75 year old male presenting status-post cholecystectomy. Note for "Post-Op Cholecystectomy": ` ` ` The patient returns s/p laparoscopic single site cholecystectomy and cholangiogram 09/04/2020.  Pathology consistent with chronic cholecystitis.  Cholelithiasis and adenomyomatosis hyperplasia.  Malignancy.  The patient returns to clinic after surgery, gradually improving.  Pain from the incisions is fading away.  He honest notices a little mild twinging.  He never used his narcotic prescription. His is back to riding his bicycle.  Appetite not totally normal but improving.  Energy level not back but slowly getting there.  Denies nausea, constipation/diarrhea, worsening fatigue, high fevers, or other concerns. ` ###########################################  Pathology: SURGICAL PATHOLOGY CASE: [id number] PATIENT: Zachary Sanchez Surgical Pathology Report  Clinical History: Symptomatic biliary colic, probably chronic cholecystitis (jmc)  FINAL MICROSCOPIC DIAGNOSIS:  A. GALLBLADDER, CHOLECYSTECTOMY: - Chronic cholecystitis with cholelithiasis and adenomyomatous hyperplasia.  Cherina Dhillon DESCRIPTION:  Specimen: Gallbladder, received fresh. Size/?Intact: 8 x 3.9 x 3 cm, intact Serosal surface: Pink green, smooth. Mucosa/Wall: Pink-green, smooth/Up to 0.2 cm thick. Contents: A 0.4 cm black roughened cholelith and minute pieces of black gritty material. Cystic duct: Patent. Block Summary: Representative sections are submitted in 1 block.  SW 09/04/2020  Final Diagnosis performed by Gillie Manners, MD.   Electronically signed 09/07/2020 Technical component performed at [facility], [place] [address] [place], [place]  [address]. Professional component performed at [employer]. [facility], [facility], [facility], [place] [address] [place], [place] [address]. Immunohistochemistry Technical component (if applicable) was performed at [facility]. [address] [place], [place], [place] [address].   IMMUNOHISTOCHEMISTRY DISCLAIMER (if applicable): Some of these immunohistochemical stains may have been developed and the performance characteristics determine by [facility]. Some may not have been cleared or approved by the U.S. Food and Drug Administration. The FDA has determined that such clearance or approval is not necessary. This test is used for clinical purposes. It should not be regarded as investigational or for research. This laboratory is certified under the [employer] (CLIA-88) as qualified to perform high complexity clinical laboratory testing.  The controls stained appropriately. ` ` ###########################################`  09/04/2020  PATIENT:  Lavenia Atlas  75 y.o. male  Patient Care Team: Derinda Late, MD as PCP - General (Family Medicine) Thompson Grayer, MD as PCP - Electrophysiology (Cardiology) Thompson Grayer, MD as PCP - Cardiology (Cardiology) Michael Boston, MD as Consulting Physician (General Surgery) Richmond Campbell, MD as Consulting Physician (Gastroenterology)  PRE-OPERATIVE DIAGNOSIS:    Chronic Calculus cholecystitis  POST-OPERATIVE DIAGNOSIS:  Chronic Calculus cholecystitis  PROCEDURE:  SINGLE SITE Laparoscopic cholecystectomy with intraoperative cholangiogram (CPT code [phone]) with IOC interpretaion  SURGEON:  Adin Hector, MD, FACS.  ASSISTANT: OR Staff  ANESTHESIA:    General with endotracheal intubation Local anesthetic as a field block  EBL:  (See Anesthesia Intraoperative Record) Total I/O In: - Out: 10 [Blood:10]  Delay start of Pharmacological VTE agent (>24hrs) due to surgical blood loss or  risk of bleeding:  no  DRAINS: None  SPECIMEN: Gallbladder    DISPOSITION OF  SPECIMEN:  PATHOLOGY  COUNTS:  YES  PLAN OF CARE: Discharge to home after PACU  PATIENT DISPOSITION:  PACU - hemodynamically stable.  INDICATION: Pleasant patient chronically anticoagulated with prior urological surgery with evidence of abdominal pain and gallbladder polyp versus stones.  Story very suspicious for biliary colic.  Rest of differential diagnosis seems underwhelming.  I offered cholecystectomy.  The anatomy & physiology of hepatobiliary & pancreatic function was discussed.  The pathophysiology of gallbladder dysfunction was discussed.  Natural history risks without surgery was discussed.   I feel the risks of no intervention will lead to serious problems that outweigh the operative risks; therefore, I recommended cholecystectomy to remove the pathology.  I explained laparoscopic techniques with possible need for an open approach.  Probable cholangiogram to evaluate the bilary tract was explained as well.    Risks such as bleeding, infection, abscess, leak, injury to other organs, need for further treatment, heart attack, death, and other risks were discussed.  I noted a good likelihood this will help address the problem.  Possibility that this will not correct all abdominal symptoms was explained.  Goals of post-operative recovery were discussed as well.  We will work to minimize complications.  An educational handout further explaining the pathology and treatment options was given as well.  Questions were answered.  The patient expresses understanding & wishes to proceed with surgery.  OR FINDINGS: Boggy dilated gallbladder with some edema consistent with chronic cholecystitis.  Cholangiogram with dilated biliary system.  Perhaps some distal common bile duct stone and sludge that easily flushed into the duodenum.  No obstruction or leak or obvious mass  Liver: normal  DESCRIPTION:  The patient was  identified & brought in the operating room. The patient was positioned supine with arms tucked. SCDs were active during the entire case. The patient underwent general anesthesia without any difficulty.  The abdomen was prepped and draped in a sterile fashion. A Surgical Timeout confirmed our plan.  I made a transverse curvilinear incision through the superior umbilical fold.  I placed a 67m long port through the supraumbilical fascia using a modified Hassan cutdown technique with umbilical stalk fascial countertraction. I began carbon dioxide insufflation.  No change in end tidal CO2 measurement.   Camera inspection revealed no injury. There were no adhesions to the anterior abdominal wall supraumbilically.  I proceeded to continue with single site technique. I placed a #5 port in left upper aspect of the wound. I placed a 5 mm atraumatic grasper in the right inferior aspect of the wound.  I turned attention to the right upper quadrant.  Gallbladder was dilated boggy with some thickening consistent with chronic cholecystitis.  No strong evidence of an acute infection at this time.  The gallbladder fundus was elevated cephalad. I freed adhesions to the ventral surface of the gallbladder off carefully.  I freed the peritoneal coverings between the gallbladder and the liver on the posteriolateral and anteriomedial walls. I alternated between Harmonic & blunt Maryland dissection to help get a good critical view of the cystic artery and cystic duct.  did further dissection to free allof the gallbladder off the liver bed to get a good critical view of the infundibulum and cystic duct. I dissected out the cystic artery; and, after getting a good 360 view, ligated the anterior & posterior branches of the cystic artery close on the infundibulum using the Harmonic ultrasonic dissection.  I skeletonized the cystic duct.  I placed a clip on the infundibulum. I  did a partial cystic duct-otomy and ensured patency. I placed  a 5 [place] cholangiocatheter through a puncture site at the right subcostal ridge of the abdominal wall and directed it into the cystic duct.  We ran a cholangiogram with dilute radio-opaque contrast and continuous fluoroscopy. Contrast flowed from a side branch consistent with cystic duct cannulization. Contrast flowed up the common hepatic duct into the right and left intrahepatic chains out to secondary radicals. Contrast flowed down the common bile duct easily across the normal ampulla into the duodenum.  Perhaps there are a few small tiny stones versus sludge but this flush down the duodenum easily.  Dr. Dema Severin was in the room and agreed cholangiogram looked rather underwhelming.  This was consistent with a normal cholangiogram.  I removed the cholangiocatheter. I placed clips on the cystic duct x4.   I completed cystic duct transection. I freed the gallbladder from its remaining attachments to the liver. I ensured hemostasis on the gallbladder fossa of the liver and elsewhere. I inspected the rest of the abdomen & detected no injury nor bleeding elsewhere.  I removed the gallbladder out the supraumbilical fascia. I closed the fascia transversely using #1 PDS interrupted stitches. I closed the skin using 4-0 monocryl stitch.  Sterile dressing was applied. The patient was extubated & arrived in the PACU in stable condition..  I had discussed postoperative care with the patient in the holding area.  I discussed typical postoperative course with recommendations.  Instructions written.  Questions answered.  Made attempt to reach his wife on the phone with no answer.  We will try again later.  Patient hopeful to recover enough to be able to go to a trip to [place] in July.  Hopefully a decent likelihood if things go well.  Follow-up closely as an outpatient.  Adin Hector, M.D., F.A.C.S. Gastrointestinal and Minimally Invasive Surgery [facility] [address] [place], [place] [address] [phone] Main / Paging  09/04/2020 9:06 AM  Allergies Adriana Reams Landmark, CNA; 09/21/2020 2:42 PM) No Known Drug Allergies   [08/10/2020]: Allergies Reconciled    Medication History Janeann Forehand, CNA; 09/21/2020 2:42 PM) Allopurinol  ('300MG'$  Tablet, Oral) Active. Xarelto  ('20MG'$  Tablet, Oral) Active. Rosuvastatin Calcium  ('40MG'$  Tablet, Oral) Active. Zolpidem Tartrate  ('10MG'$  Tablet, Oral) Active. Ezetimibe  ('10MG'$  Tablet, Oral) Active. Indomethacin  ('50MG'$  Capsule, Oral) Active. Medications Reconciled   Physical Exam Adin Hector MD; 09/21/2020 2:53 PM) General Mental Status - Alert. General Appearance - Not in acute distress. Voice - Normal. Note:  Relaxed.  Nontoxic.  Integumentary Global Assessment Upon inspection and palpation of skin surfaces of the - Distribution of scalp and body hair is normal. General Characteristics Overall examination of the patient's skin reveals - no rashes and no suspicious lesions.  Head and Neck Head - normocephalic, atraumatic with no lesions or palpable masses. Face Global Assessment - atraumatic, no absence of expression. Neck Global Assessment - no abnormal movements, no decreased range of motion. Trachea - midline. Thyroid Gland Characteristics - non-tender.  Eye Eyeball - Left - Extraocular movements intact, No Nystagmus - Left. Eyeball - Right - Extraocular movements intact, No Nystagmus - Right. Upper Eyelid - Left - No Cyanotic - Left. Upper Eyelid - Right - No Cyanotic - Right.  Chest and Lung Exam Inspection Accessory muscles - No use of accessory muscles in breathing.  Abdomen Note:  Incisions with normal healing ridges.  No active bleeding.  No cellulitis.  No  guarding/rebound tenderness  Peripheral Vascular Upper Extremity Inspection - Left - Not Gangrenous, No Petechiae. Inspection - Right - Not Gangrenous, No Petechiae.  Neurologic Neurologic evaluation reveals  - normal  attention span and ability to concentrate, able to name objects and repeat phrases. Appropriate fund of knowledge and normal coordination.  Neuropsychiatric Mental status exam performed with findings of - able to articulate well with normal speech/language, rate, volume and coherence and no evidence of hallucinations, delusions, obsessions or homicidal/suicidal ideation. Orientation - oriented X3.  Musculoskeletal Global Assessment Gait and Station - normal gait and station.  Lymphatic General Lymphatics Description - No Generalized lymphadenopathy.  Assessment & Plan Adin Hector MD; 09/21/2020 5:16 PM) CHRONIC CHOLECYSTITIS WITH CALCULUS (K80.10) Story: Story biliary colic and hypersalivation status post lap cholecystectomy single site 09/04/2020. Impression: Recovering relatively well only 2 weeks out from cholecystectomy. He glad to have any more attacks.  He's is back to riding his bicycle and moderately active. I noted decreased energy is common at first but should continue to improve, especially in light of his activity level. He seems mostly reassured.  Went over pathology and technique. Questions answered. He expressed understanding and appreciation Current Plans Pt Education - Education: Pathology Report given to patient F/U PRN - S/P LAPAROSCOPIC SURGERY [id number]) Current Plans Return to clinic as needed.   Soreness, decreased appetite, and poor energy level are common problems after surgery.  While many people can struggle with a bad day, these concerns should gradually fade away or at least improve.  Much of your recovery depends on your health & the severity of your operation.  Please call if you have any further questions / concerns related to surgery.  Increase activity as tolerated to regular everyday activity.  Consider daily low impact exercise every day such as walking an hour a day.   Do not push through pain.  If it hurts to do it, then don't do it.  Diet as  tolerated.  Low fat high fiber diet ideal.   30 g fiber a day ideal.  Consider taking a daily fiber supplement to keep your bowels regular.  Followup with your primary care physician for other health issues as would normally be done.   Consider screening for malignancies (breast, prostate, colon, melanoma, etc) as appropriate.  Discuss with you primary care physician.  Pt Education - [facility] Laparosopic Post Op HCI (Jemya Depierro)  Signed electronically by Adin Hector, MD (09/21/2020 5:16 PM)  SURGERY NOTES:  Located in [place]' section of Epic EMR chart  09/04/2020   PATIENT: Lavenia Atlas 75 y.o. male   Patient Care Team: Derinda Late, MD as PCP - General (Family Medicine) Thompson Grayer, MD as PCP - Electrophysiology (Cardiology) Thompson Grayer, MD as PCP - Cardiology (Cardiology) Michael Boston, MD as Consulting Physician (General Surgery) Richmond Campbell, MD as Consulting Physician (Gastroenterology)   PRE-OPERATIVE DIAGNOSIS:    Chronic Calculus cholecystitis   POST-OPERATIVE DIAGNOSIS:    Chronic Calculus cholecystitis   PROCEDURE: SINGLE SITE Laparoscopic cholecystectomy with intraoperative cholangiogram (CPT code [id number]) with IOC interpretaion   SURGEON: Adin Hector, MD, FACS.   ASSISTANT: OR Staff    ANESTHESIA:  General with endotracheal intubation Local anesthetic as a field block   EBL: (See Anesthesia Intraoperative Record) Total I/O In: -  Out: 10 [Blood:10]   Delay start of Pharmacological VTE agent (>24hrs) due to surgical blood loss or risk of bleeding: no   DRAINS: None    SPECIMEN: Gallbladder  DISPOSITION OF SPECIMEN: PATHOLOGY   COUNTS: YES   PLAN OF CARE: Discharge to home after PACU   PATIENT DISPOSITION: PACU - hemodynamically stable.   INDICATION: Pleasant patient chronically anticoagulated with prior urological surgery with evidence of abdominal pain and gallbladder polyp versus stones. Story very suspicious for biliary colic.  Rest of differential diagnosis seems underwhelming. I offered cholecystectomy.   The anatomy & physiology of hepatobiliary & pancreatic function was discussed. The pathophysiology of gallbladder dysfunction was discussed. Natural history risks without surgery was discussed. I feel the risks of no intervention will lead to serious problems that outweigh the operative risks; therefore, I recommended cholecystectomy to remove the pathology. I explained laparoscopic techniques with possible need for an open approach. Probable cholangiogram to evaluate the bilary tract was explained as well.    Risks such as bleeding, infection, abscess, leak, injury to other organs, need for further treatment, heart attack, death, and other risks were discussed. I noted a good likelihood this will help address the problem. Possibility that this will not correct all abdominal symptoms was explained. Goals of post-operative recovery were discussed as well. We will work to minimize complications. An educational handout further explaining the pathology and treatment options was given as well. Questions were answered. The patient expresses understanding & wishes to proceed with surgery.   OR FINDINGS: Boggy dilated gallbladder with some edema consistent with chronic cholecystitis. Cholangiogram with dilated biliary system. Perhaps some distal common bile duct stone and sludge that easily flushed into the duodenum. No obstruction or leak or obvious mass   Liver: normal   DESCRIPTION:    The patient was identified & brought in the operating room. The patient was positioned supine with arms tucked. SCDs were active during the entire case. The patient underwent general anesthesia without any difficulty. The abdomen was prepped and draped in a sterile fashion. A Surgical Timeout confirmed our plan.   I made a transverse curvilinear incision through the superior umbilical fold. I placed a 49m long port through the supraumbilical  fascia using a modified Hassan cutdown technique with umbilical stalk fascial countertraction. I began carbon dioxide insufflation. No change in end tidal CO2 measurement. Camera inspection revealed no injury. There were no adhesions to the anterior abdominal wall supraumbilically. I proceeded to continue with single site technique. I placed a #5 port in left upper aspect of the wound. I placed a 5 mm atraumatic grasper in the right inferior aspect of the wound.   I turned attention to the right upper quadrant. Gallbladder was dilated boggy with some thickening consistent with chronic cholecystitis. No strong evidence of an acute infection at this time. The gallbladder fundus was elevated cephalad. I freed adhesions to the ventral surface of the gallbladder off carefully. I freed the peritoneal coverings between the gallbladder and the liver on the posteriolateral and anteriomedial walls. I alternated between Harmonic & blunt [place] dissection to help get a good critical view of the cystic artery and cystic duct. did further dissection to free allof the gallbladder off the liver bed to get a good critical view of the infundibulum and cystic duct. I dissected out the cystic artery; and, after getting a good 360 view, ligated the anterior & posterior branches of the cystic artery close on the infundibulum using the Harmonic ultrasonic dissection.   I skeletonized the cystic duct. I placed a clip on the infundibulum. I did a partial cystic duct-otomy and ensured patency. I placed a 5 FPakistancholangiocatheter through a puncture site  at the right subcostal ridge of the abdominal wall and directed it into the cystic duct. We ran a cholangiogram with dilute radio-opaque contrast and continuous fluoroscopy. Contrast flowed from a side branch consistent with cystic duct cannulization. Contrast flowed up the common hepatic duct into the right and left intrahepatic chains out to secondary radicals. Contrast flowed down  the common bile duct easily across the normal ampulla into the duodenum. Perhaps there are a few small tiny stones versus sludge but this flush down the duodenum easily. Dr. Dema Severin was in the room and agreed cholangiogram looked rather underwhelming. This was consistent with a normal cholangiogram.   I removed the cholangiocatheter. I placed clips on the cystic duct x4. I completed cystic duct transection. I freed the gallbladder from its remaining attachments to the liver. I ensured hemostasis on the gallbladder fossa of the liver and elsewhere. I inspected the rest of the abdomen & detected no injury nor bleeding elsewhere.   I removed the gallbladder out the supraumbilical fascia. I closed the fascia transversely using #1 PDS interrupted stitches. I closed the skin using 4-0 monocryl stitch. Sterile dressing was applied. The patient was extubated & arrived in the PACU in stable condition..   I had discussed postoperative care with the patient in the holding area. I discussed typical postoperative course with recommendations. Instructions written. Questions answered. Made attempt to reach his wife on the phone with no answer. We will try again later. Patient hopeful to recover enough to be able to go to a trip to [place] in July. Hopefully a decent likelihood if things go well. Follow-up closely as an outpatient.   Adin Hector, M.D., F.A.C.S. Gastrointestinal and Minimally Invasive Surgery [facility] [address], [place], [place] [address] [phone] Main / Paging   09/04/2020 9:06 AM  PATHOLOGY:  Located in [place]' section of Epic EMR chart  SURGICAL PATHOLOGY  CASE: [id number]  PATIENT: Zachary Sanchez  Surgical Pathology Report   Clinical History: Symptomatic biliary colic, probably chronic  cholecystitis (jmc)   FINAL MICROSCOPIC DIAGNOSIS:   A. GALLBLADDER, CHOLECYSTECTOMY:  - Chronic cholecystitis with cholelithiasis and  adenomyomatous  hyperplasia.   Matilde Markie DESCRIPTION:   Specimen: Gallbladder, received fresh.  Size/?Intact: 8 x 3.9 x 3 cm, intact  Serosal surface: Pink green, smooth.  Mucosa/Wall: Pink-green, smooth/Up to 0.2 cm thick.  Contents: A 0.4 cm black roughened cholelith and minute pieces of black  gritty material.  Cystic duct: Patent.  Block Summary: Representative sections are submitted in 1 block.  SW  09/04/2020   Final Diagnosis performed by Gillie Manners, MD.   Electronically  signed 09/07/2020  Technical component performed at [facility], [place]  [address], [place], [place] [address].   Professional component performed at [employer]. [facility],  [place] [address] [place], [place] [address].   Immunohistochemistry Technical component (if applicable) was performed  at [facility]. [address] [place],  [place],  [address].   IMMUNOHISTOCHEMISTRY DISCLAIMER (if applicable):  Some of these immunohistochemical stains may have been developed and the  performance characteristics determine by [facility]. Some  may not have been cleared or approved by the U.S. Food and Drug  Administration. The FDA has determined that such clearance or approval  is not necessary. This test is used for clinical purposes. It should not  be regarded as investigational or for research. This laboratory is  certified under the Clinical Laboratory Improvement  Amendments of 1988  (CLIA-88) as qualified to perform high complexity clinical laboratory  testing.  The controls stained appropriately.   Assessment and Plan:  DIAGNOSES:  Diagnoses and all orders for this visit:  Left inguinal hernia - [facility] Case Posting Request; Future  Nausea and vomiting in adult  Chronic constipation    ASSESSMENT/PLAN  Patient with recurrent nausea and vomiting and obvious left inguinal hernia containing colon by CAT scan. Sensitive but reducible.  Nothing obvious felt on my exam 4 months ago but clearly has something now.  I think he requires repair. He is having persistent symptoms that are rather significant. I would recommend laparoscopic exploration and repair of hernias found. Definite left. Low threshold to fix contralateral right side given the size of the left 1 already. He is rather miserable this so I do not think he can wait long. See if we can fit him on later this week or the following. He needs to get done soon. He agrees.  The anatomy & physiology of the abdominal wall and pelvic floor was discussed. The pathophysiology of hernias in the inguinal and pelvic region was discussed. Natural history risks such as progressive enlargement, pain, incarceration, and strangulation was discussed. Contributors to complications such as smoking, obesity, diabetes, prior surgery, etc were discussed.   I feel the risks of no intervention will lead to serious problems that outweigh the operative risks; therefore, I recommended surgery to reduce and repair the hernia. I explained laparoscopic techniques with possible need for an open approach. I noted usual use of mesh to patch and/or buttress hernia repair  Risks such as bleeding, infection, abscess, need for further treatment, injury to other organs, need for repair of tissues / organs, stroke, heart attack, death, and other risks were discussed. I noted a good likelihood this will help address the problem. Goals of post-operative recovery were discussed as well. Possibility that this will not correct all symptoms was explained. I stressed the importance of low-impact activity, aggressive pain control, avoiding constipation, & not pushing through pain to minimize risk of post-operative chronic pain or injury. Possibility of reherniation was discussed. We will work to minimize complications.   An educational handout further explaining the pathology & treatment options was given as well. Questions were  answered. The patient expresses understanding & wishes to proceed with surgery.  Needs to hold his Xarelto 2 days prior to surgery. He is otherwise rather active and tolerating his cholecystectomy 3 months ago, so I do not think he needs extra clearance.  FOLLOWUP: Return for Plan to schedule surgery. See instructions.  I had direct face-to-face contact with the patient for a total of 35 minutes and greater than 50% of that time was spent providing counseling and/or coordination of care for the patient regarding the above.  ########################################################  Adin Hector, MD, FACS, MASCRS Esophageal, Gastrointestinal & Colorectal Surgery Robotic and Minimally Invasive Surgery  [facility], [place]. [address], [place], [place] [address] [phone] Fax [phone] Main       Note: Portions of this report may have been transcribed using voice recognition software. Every effort was made to ensure accuracy; however, inadvertent computerized transcription errors may be present. Any transcriptional errors that result from this process are unintentional. Electronically signed by Johney Maine, Adrian Saran, MD at 12/14/2020 11:05 AM EDT

## 2020-12-16 NOTE — Op Note (Signed)
12/16/2020  4:30 PM  PATIENT:  Zachary Sanchez  75 y.o. male  Patient Care Team: Derinda Late, MD as PCP - General (Family Medicine) Thompson Grayer, MD as PCP - Electrophysiology (Cardiology) Thompson Grayer, MD as PCP - Cardiology (Cardiology) Michael Boston, MD as Consulting Physician (General Surgery) Richmond Campbell, MD as Consulting Physician (Gastroenterology)  PRE-OPERATIVE DIAGNOSIS:  LEFT INGUINAL, POSSIBLE RIGHT INGUINAL HERNIA  POST-OPERATIVE DIAGNOSIS:   BILATERAL INGUINAL HERNIAS CECAL BASCULE WITHOUT VOLVULUS  PROCEDURE:   DIAGNOSTIC LAPAROSCOPY LAPAROSCOPIC BILATERAL INGUINAL HERNIA REPAIRS TRANSVERSUS ABDOMINIS PLANE (TAP) BLOCK - BILATERAL  SURGEON:  Adin Hector, MD  ASSISTANT: Carlena Hurl, PA-C An experienced assistant was required given the standard of surgical care given the complexity of the case.  This assistant was needed for exposure, dissection, suction, tissue approximation, retraction, perception, etc.   ANESTHESIA:     Regional ilioinguinal and genitofemoral and spermatic cord nerve blocks  General  Regional TRANSVERSUS ABDOMINIS PLANE (TAP) nerve block for perioperative & postoperative pain control provided with liposomal bupivacaine (Experel) mixed with 0.25% bupivacaine as a Bilateral TAP block x 35m each side at the level of the transverse abdominis & preperitoneal spaces along the flank at the anterior axillary line, from subcostal ridge to iliac crest under laparoscopic guidance    EBL:  Total I/O In: 2000 [I.V.:2000] Out: 25 [Blood:25].  See anesthesia record  Delay start of Pharmacological VTE agent (>24hrs) due to surgical blood loss or risk of bleeding:  no  DRAINS: NONE  SPECIMEN:  NONE  DISPOSITION OF SPECIMEN:  N/A  COUNTS:  YES  PLAN OF CARE: Discharge to home after PACU  PATIENT DISPOSITION:  PACU - hemodynamically stable.  INDICATION: Patient with abdominal pain.  Had cholecystectomy earlier this year with evidence of  chronic cholecystitis.  Had worsening abdominal pain.  CAT scan revealed cecum going into a left inguinal hernia.  Friend then exam earlier in the year.  I recommended laparoscopic exploration and repair of hernias found  The anatomy & physiology of the abdominal wall and pelvic floor was discussed.  The pathophysiology of hernias in the inguinal and pelvic region was discussed.  Natural history risks such as progressive enlargement, pain, incarceration & strangulation was discussed.   Contributors to complications such as smoking, obesity, diabetes, prior surgery, etc were discussed.    I feel the risks of no intervention will lead to serious problems that outweigh the operative risks; therefore, I recommended surgery to reduce and repair the hernia.  I explained laparoscopic techniques with possible need for an open approach.  I noted usual use of mesh to patch and/or buttress hernia repair  Risks such as bleeding, infection, abscess, need for further treatment, heart attack, death, and other risks were discussed.  I noted a good likelihood this will help address the problem.   Goals of post-operative recovery were discussed as well.  Possibility that this will not correct all symptoms was explained.  I stressed the importance of low-impact activity, aggressive pain control, avoiding constipation, & not pushing through pain to minimize risk of post-operative chronic pain or injury. Possibility of reherniation was discussed.  We will work to minimize complications.     An educational handout further explaining the pathology & treatment options was given as well.  Questions were answered.  The patient expresses understanding & wishes to proceed with surgery.  OR FINDINGS: No intra-abdominal pathology except from some mild left upper quadrant omental adhesions.  Freed off.  No hernia abscess.  No obstruction.  Hernia on left side that had contained colon was reducible.  Moderately large left indirect  inguinal hernia with some direct space hernia as well.  No femoral hernia.  On the right smaller but definite indirect inguinal hernia.  No direct space nor femoral hernia.  Rather dense suprapubic preperitoneal adhesions from prior prostatectomy.  Minimal intra-abdominal adhesions of omentum in the left upper quadrant only.  Stretched out floppy cecal bascule and long appendix but no volvulus.  No malrotation.  No ileitis.  No Meckel's diverticulum.  No interloop adhesions.  No obstruction.  Colon stomach and liver underwhelming.  Minimal mesocolon and omental adhesions in right upper quadrant from prior cholecystectomy  DESCRIPTION:  Informed consent was confirmed.  The patient underwent general anaesthesia without difficulty.  The patient was positioned appropriately.  VTE prevention in place.  The patient's abdomen was clipped, prepped, & draped in a sterile fashion.  Surgical timeout confirmed our plan.  Peritoneal entry with a laparoscopic port was obtained using Varess spring needle entry technique in the left upper abdomen as the patient was positioned in reverse Trendelenburg.  I induced carbon dioxide insufflation.  No change in end tidal CO2 measurements.  Full symmetrical abdominal distention.  Initial port was carefully placed.  Camera inspection revealed no injury.  Extra ports were carefully placed under direct laparoscopic visualization.    Proceed with diagnostic laparoscopy.  There was some greater omentum adhesions in the left upper quadrant but no other adhesions elsewhere.  Obvious large left indirect inguinal hernia and smaller right indirect inguinal hernia.  I freed greater omental adhesions off the upper quadrant to help it potentially reach down.  I found the ileocecal region.  Patient had a very floppy cecal bascule and a very long appendix.  No inflammation.  I ran the small bowel from the ileocecal valve to the ligament of Treitz.  He had very long length of small bowel but I  saw no interloop adhesions or obstruction or any other explanation for abdominal pain.  No Meckel's diverticulum.  Liver and right upper quadrant look clear with Tami Lin from prior cholecystectomy.  Stomach looked normal.  Colon from cecal bascule to peritoneal flexion looked underwhelming.  Consistent with no other etiology for his abdominal pain aside from his inguinal hernia that had documented cecal bascule and it on CAT scan.  I made a transverse incision through the inferior umbilical fold.  I made a small transverse nick through the anterior rectus fascia contralateral to the inguinal hernia side and placed a 0-vicryl stitch through the fascia.  I placed a Hasson trocar into the preperitoneal plane.  Entry was clean.  We induced carbon dioxide insufflation. Camera inspection revealed no injury.  I used a 36m angled scope to bluntly free the peritoneum off the infraumbilical anterior abdominal wall.  I created enough of a preperitoneal pocket to place 539mports into the right & left mid-abdomen into this preperitoneal cavity.  I focused attention on the RIGHT pelvis since that was the dominant hernia side.   I used blunt & focused sharp dissection to free the peritoneum off the flank and down to the pubic rim.  I dissected the anteriolateral bladder wall off the anteriolateral pelvic wall, sparing midline attachments.   He had dense concrete adhesions here consistent with his prior prostatectomy.  I held off going down to the obturator foramen.  I located a swath of peritoneum going into a hernia fascial defect at the  internal ring consistent with  an  indirect inguinal hernia..  I gradually freed the peritoneal hernia sac off safely and reduced it into the preperitoneal space.  I freed the peritoneum off the spermatic vessels & vas deferens.  I freed peritoneum off the retroperitoneum along the psoas muscle.  Spermatic cord lipoma was dissected away & removed.  I checked & assured hemostasis.      I turned attention on the opposite  LEFT pelvis.  I did dissection in a similar, mirror-image fashion. The patient had an indirect inguinal hernia.  Hernia sac was going between vas deferens and spermatic vessels which is atypical but most likely related to prior iliac dissection and prostatectomy.  Nonetheless able to get it safely reduced.  I did have a breach in the peritoneum given the extremely dense adhesions and attachments to the left suprapubic brim.  I mobilized hernia sac off the retroperitoneum as well as the spermatic cord vessels and vas deferens.  I closed the peritoneal breech using 2-0 Vicryl suture using laparoscopic intracorporeal suturing.Marland Kitchen   Spermatic cord lipoma was dissected away & removed.    I checked & assured hemostasis.    I chose sheets of medium-weight polypropylene Bard Marlex 15x15cm, for the left side and ultralightweight polypropylene ultra Pro mesh 15 x 15 cm for the smaller right side.  I cut a single sigmoid-shaped slit ~6cm from a corner of each mesh.  I placed the meshes into the preperitoneal space & laid them as overlapping diamonds such that at the inferior points, a 6x6 cm corner flap rested in the true anterolateral pelvis, covering the obturator & femoral foramina.   I allowed the bladder to return to the pubis, this helping tuck the corners of the mesh in the anteriolateral pelvis.  The medial corners overlapped each other across midline cephalad to the pubic rim.   Given the numerous hernias of moderate size, I placed a third 15x15cm Ultrapro mesh in the center as a vertical diamond.  The lateral wings of the mesh overlap across the direct spaces and internal rings where the dominant hernias were.  This provided good coverage and reinforcement of the hernia repairs.  Because of the central mesh placement with good overlap, I did not place any tacks.   I held the hernia sacs cephalad & evacuated carbon dioxide.  I closed the fascia with absorbable suture.  I  closed the skin using 4-0 monocryl stitch.  Sterile dressings were applied.   The patient was extubated & arrived in the PACU in stable condition..  I had discussed postoperative care with the patient in the holding area.  Instructions are written in the chart.  I discussed operative findings, updated the patient's status, discussed probable steps to recovery, and gave postoperative recommendations to the patient's spouse.  Recommendations were made.  Questions were answered.  She expressed understanding & appreciation.   Adin Hector, M.D., F.A.C.S. Gastrointestinal and Minimally Invasive Surgery [facility] [address], [place], [place] [address] [phone] Main / Paging  12/16/2020 4:30 PM

## 2020-12-17 ENCOUNTER — Encounter (HOSPITAL_COMMUNITY): Payer: Self-pay | Admitting: Surgery

## 2020-12-22 NOTE — Anesthesia Postprocedure Evaluation (Signed)
Anesthesia Post Note  Patient: Zachary Sanchez  Procedure(s) Performed: LAPAROSCOPIC LEFT AND  RIGHT INGUINAL HERNIA REPAIR; TAP BLOCKS (Left)     Patient location during evaluation: PACU Anesthesia Type: General Level of consciousness: awake and alert Pain management: pain level controlled Vital Signs Assessment: post-procedure vital signs reviewed and stable Respiratory status: spontaneous breathing, nonlabored ventilation, respiratory function stable and patient connected to nasal cannula oxygen Cardiovascular status: blood pressure returned to baseline and stable Postop Assessment: no apparent nausea or vomiting Anesthetic complications: no   No notable events documented.  Last Vitals:  Vitals:   12/16/20 1800 12/16/20 1815  BP: (!) 136/98 (!) 145/97  Pulse: 81 77  Resp: 10 16  Temp:  (!) 36.1 C  SpO2: 96% 95%    Last Pain:  Vitals:   12/16/20 1815  TempSrc:   PainSc: 0-No pain                 Tiajuana Amass

## 2021-03-15 DIAGNOSIS — R311 Benign essential microscopic hematuria: Secondary | ICD-10-CM | POA: Diagnosis not present

## 2021-03-15 DIAGNOSIS — R319 Hematuria, unspecified: Secondary | ICD-10-CM | POA: Diagnosis not present

## 2021-03-22 DIAGNOSIS — Z23 Encounter for immunization: Secondary | ICD-10-CM | POA: Diagnosis not present

## 2021-03-22 DIAGNOSIS — N390 Urinary tract infection, site not specified: Secondary | ICD-10-CM | POA: Diagnosis not present

## 2021-03-22 DIAGNOSIS — I4819 Other persistent atrial fibrillation: Secondary | ICD-10-CM | POA: Diagnosis not present

## 2021-03-22 DIAGNOSIS — G4700 Insomnia, unspecified: Secondary | ICD-10-CM | POA: Diagnosis not present

## 2021-03-22 DIAGNOSIS — K760 Fatty (change of) liver, not elsewhere classified: Secondary | ICD-10-CM | POA: Diagnosis not present

## 2021-03-22 DIAGNOSIS — M109 Gout, unspecified: Secondary | ICD-10-CM | POA: Diagnosis not present

## 2021-03-22 DIAGNOSIS — K5730 Diverticulosis of large intestine without perforation or abscess without bleeding: Secondary | ICD-10-CM | POA: Diagnosis not present

## 2021-03-22 DIAGNOSIS — E7800 Pure hypercholesterolemia, unspecified: Secondary | ICD-10-CM | POA: Diagnosis not present

## 2021-03-22 DIAGNOSIS — R7302 Impaired glucose tolerance (oral): Secondary | ICD-10-CM | POA: Diagnosis not present

## 2021-03-22 DIAGNOSIS — I2510 Atherosclerotic heart disease of native coronary artery without angina pectoris: Secondary | ICD-10-CM | POA: Diagnosis not present

## 2021-03-22 DIAGNOSIS — C61 Malignant neoplasm of prostate: Secondary | ICD-10-CM | POA: Diagnosis not present

## 2021-03-22 DIAGNOSIS — Z0000 Encounter for general adult medical examination without abnormal findings: Secondary | ICD-10-CM | POA: Diagnosis not present

## 2021-03-29 DIAGNOSIS — R311 Benign essential microscopic hematuria: Secondary | ICD-10-CM | POA: Diagnosis not present

## 2021-03-29 DIAGNOSIS — N390 Urinary tract infection, site not specified: Secondary | ICD-10-CM | POA: Diagnosis not present

## 2021-03-31 ENCOUNTER — Other Ambulatory Visit: Payer: Self-pay | Admitting: Internal Medicine

## 2021-03-31 DIAGNOSIS — I4821 Permanent atrial fibrillation: Secondary | ICD-10-CM

## 2021-03-31 NOTE — Telephone Encounter (Signed)
Xarelto 20mg  refill request received. Pt is 75 years old, weight-95.3kg, Crea-0.71 on 08/31/2020, last seen by Oda Kilts on 06/18/2020, Diagnosis-Afib, CrCl-121.81ml/min; Dose is appropriate based on dosing criteria. Will send in refill to requested pharmacy.

## 2021-04-18 ENCOUNTER — Other Ambulatory Visit: Payer: Self-pay | Admitting: Internal Medicine

## 2021-04-18 DIAGNOSIS — I4821 Permanent atrial fibrillation: Secondary | ICD-10-CM

## 2021-04-20 NOTE — Telephone Encounter (Signed)
Pt last saw Oda Kilts, [place] on 06/18/20, last labs 08/31/20 Creat 0.71, age 76, weight 95.3kg, CrCl 121.18, based on CrCl pt is on appropriate dosage of Xarelto 20mg  QD for afib.  Will refill rx.

## 2021-06-14 NOTE — Progress Notes (Signed)
PCP:  Derinda Late, MD (Inactive) Primary Cardiologist: Thompson Grayer, MD Electrophysiologist: Thompson Grayer, MD   Zachary Sanchez is a 76 y.o. male seen today for Thompson Grayer, MD for routine electrophysiology followup.  Since last being seen in our clinic the patient reports doing very well.  he denies chest pain, palpitations, dyspnea, PND, orthopnea, nausea, vomiting, dizziness, syncope, edema, weight gain, or early satiety.  Past Medical History:  Diagnosis Date   Aortic root enlargement (HCC)    aortic root 33mm in size   Atrial flutter (HCC)    Biatrial enlargement    Colonic polyp    DJD (degenerative joint disease)    DVT (deep venous thrombosis) ([place]) 03/1016   post knee surgery, on eliquis   Dysrhythmia    a fib   Gout    Heart murmur    when younger   History of benign prostatic hypertrophy    Hyperlipidemia    Persistent atrial fibrillation (HCC)    longstanding persistent, asymptomatic   Pneumonia    Prostate cancer ([place])    Pulmonary emboli ([place]) 03/2016   from DVT post knee surgery   Rupture of quadriceps tendon 05/10/2017   Wears glasses    Past Surgical History:  Procedure Laterality Date   FRACTURE SURGERY     ankles and fingers   INGUINAL HERNIA REPAIR Left 12/16/2020   Procedure: LAPAROSCOPIC LEFT AND  RIGHT INGUINAL HERNIA REPAIR; TAP BLOCKS;  Surgeon: Therman Hughlett Boston, MD;  Location: [facility];  Service: General;  Laterality: Left;  GEN AND LOCAL   KNEE SURGERY     right; menicus tear    LAPAROSCOPIC CHOLECYSTECTOMY SINGLE SITE WITH INTRAOPERATIVE CHOLANGIOGRAM N/A 09/04/2020   Procedure: LAPAROSCOPIC CHOLECYSTECTOMY SINGLE SITE WITH INTRAOPERATIVE CHOLANGIOGRAM BILATERAL TAP BLOCK;  Surgeon: Lacy Sofia Boston, MD;  Location: [facility];  Service: General;  Laterality: N/A;   LYMPHADENECTOMY Bilateral 07/09/2015   Procedure: PELVIC LYMPHADENECTOMY;  Surgeon: Raynelle Bring, MD;  Location: [facility];  Service: Urology;  Laterality: Bilateral;   QUADRICEPS REPAIR      ROBOT ASSISTED LAPAROSCOPIC RADICAL PROSTATECTOMY N/A 07/09/2015   Procedure: XI ROBOTIC ASSISTED LAPAROSCOPIC RADICAL PROSTATECTOMY LEVEL 2;  Surgeon: Raynelle Bring, MD;  Location: [facility];  Service: Urology;  Laterality: N/A;   TONSILLECTOMY     TOTAL KNEE ARTHROPLASTY Right 03/04/2016   Procedure: RIGHT TOTAL KNEE ARTHROPLASTY;  Surgeon: Sydnee Cabal, MD;  Location: [facility];  Service: Orthopedics;  Laterality: Right;    Current Outpatient Medications  Medication Sig Dispense Refill   allopurinol (ZYLOPRIM) 300 MG tablet Take 300 mg by mouth daily.     cholecalciferol (VITAMIN D) 1000 units tablet Take 2,000 Units by mouth daily.     ezetimibe (ZETIA) 10 MG tablet Take 10 mg by mouth daily.     Glucosamine-Chondroitin (GLUCOSAMINE CHONDR COMPLEX PO) Take 2 tablets by mouth daily.     ibuprofen (ADVIL) 200 MG tablet Take 400 mg by mouth every 6 (six) hours as needed for mild pain or headache.     ondansetron (ZOFRAN) 4 MG tablet Take 1 tablet (4 mg total) by mouth every 8 (eight) hours as needed for nausea. 8 tablet 5   rivaroxaban (XARELTO) 20 MG TABS tablet TAKE 1 TABLET BY MOUTH ONCE DAILY WITH SUPPER 90 tablet 1   rosuvastatin (CRESTOR) 40 MG tablet Take 1 tablet by mouth daily.     sildenafil (REVATIO) 20 MG tablet SMARTSIG:3-5 Tablet(s) By Mouth Daily PRN     zolpidem (AMBIEN) 10 MG tablet Take  5 mg by mouth at bedtime as needed for sleep.     No current facility-administered medications for this visit.    No Known Allergies  Social History   Socioeconomic History   Marital status: Married    Spouse name: Not on file   Number of children: Not on file   Years of education: Not on file   Highest education level: Not on file  Occupational History   Occupation: Engineer, maintenance (IT)    Employer: [employer]  Tobacco Use   Smoking status: Former    Packs/day: 1.00    Years: 20.00    Pack years: 20.00    Types: Cigarettes    Quit date: 02/11/1979    Years since quitting:  42.3   Smokeless tobacco: Never  Vaping Use   Vaping Use: Never used  Substance and Sexual Activity   Alcohol use: Yes    Alcohol/week: 21.0 standard drinks    Types: 21 Glasses of wine per week    Comment: 2-3 glasses of wine daily   Drug use: No   Sexual activity: Not Currently  Other Topics Concern   Not on file  Social History Narrative   Not on file   Social Determinants of Health   Financial Resource Strain: Not on file  Food Insecurity: Not on file  Transportation Needs: Not on file  Physical Activity: Not on file  Stress: Not on file  Social Connections: Not on file  Intimate Partner Violence: Not on file     Review of Systems: All other systems reviewed and are otherwise negative except as noted above.  Physical Exam: Vitals:   06/15/21 0944  BP: 120/60  Pulse: (!) 53  SpO2: 98%  Weight: 220 lb (99.8 kg)  Height: 6\' 2"  (1.88 m)    GEN- The patient is well appearing, alert and oriented x 3 today.   HEENT: normocephalic, atraumatic; sclera clear, conjunctiva pink; hearing intact; oropharynx clear; neck supple, no JVP Lymph- no cervical lymphadenopathy Lungs- Clear to ausculation bilaterally, normal work of breathing.  No wheezes, rales, rhonchi Heart- Regular rate and rhythm, no murmurs, rubs or gallops, PMI not laterally displaced GI- soft, non-tender, non-distended, bowel sounds present, no hepatosplenomegaly Extremities- no clubbing, cyanosis, or edema; DP/PT/radial pulses 2+ bilaterally MS- no significant deformity or atrophy Skin- warm and dry, no rash or lesion Psych- euthymic mood, full affect Neuro- strength and sensation are intact  EKG is ordered. Personal review of EKG from today shows atrial fib (permanent) at 53 bpm  Additional studies reviewed include: Previous EP office notes.   Assessment and Plan:  1. Permanent AF Rate controlled Asymptomatic CHA2DS2VASC of 4 now given age. He is on Xarelto for prior DVT/PTE Labs today.    2.  DVT/PE Continue Xarelto   3. HTN Stable on current regimen   Follow up with  EPMD  in 12 months   Shirley Friar, [place]  06/15/21 10:01 AM

## 2021-06-15 ENCOUNTER — Encounter: Payer: Self-pay | Admitting: Student

## 2021-06-15 ENCOUNTER — Other Ambulatory Visit: Payer: Self-pay

## 2021-06-15 ENCOUNTER — Ambulatory Visit: Payer: Medicare Other | Admitting: Student

## 2021-06-15 VITALS — BP 120/60 | HR 53 | Ht 74.0 in | Wt 220.0 lb

## 2021-06-15 DIAGNOSIS — Z86718 Personal history of other venous thrombosis and embolism: Secondary | ICD-10-CM

## 2021-06-15 DIAGNOSIS — I4819 Other persistent atrial fibrillation: Secondary | ICD-10-CM | POA: Diagnosis not present

## 2021-06-15 DIAGNOSIS — D6869 Other thrombophilia: Secondary | ICD-10-CM | POA: Diagnosis not present

## 2021-06-15 LAB — BASIC METABOLIC PANEL
BUN/Creatinine Ratio: 13 (ref 10–24)
BUN: 12 mg/dL (ref 8–27)
CO2: 24 mmol/L (ref 20–29)
Calcium: 9.8 mg/dL (ref 8.6–10.2)
Chloride: 102 mmol/L (ref 96–106)
Creatinine, Ser: 0.9 mg/dL (ref 0.76–1.27)
Glucose: 106 mg/dL — ABNORMAL HIGH (ref 70–99)
Potassium: 4.6 mmol/L (ref 3.5–5.2)
Sodium: 139 mmol/L (ref 134–144)
eGFR: 89 mL/min/{1.73_m2} (ref >59)

## 2021-06-15 LAB — CBC
Hematocrit: 42.4 % (ref 37.5–51.0)
Hemoglobin: 14.7 g/dL (ref 13.0–17.7)
MCH: 34.3 pg — ABNORMAL HIGH (ref 26.6–33.0)
MCHC: 34.7 g/dL (ref 31.5–35.7)
MCV: 99 fL — ABNORMAL HIGH (ref 79–97)
Platelets: 197 10*3/uL (ref 150–450)
RBC: 4.28 x10E6/uL (ref 4.14–5.80)
RDW: 12.3 % (ref 11.6–15.4)
WBC: 5.4 10*3/uL (ref 3.4–10.8)

## 2021-06-15 NOTE — Patient Instructions (Signed)
Medication Instructions:  Your physician recommends that you continue on your current medications as directed. Please refer to the Current Medication list given to you today.  *If you need a refill on your cardiac medications before your next appointment, please call your pharmacy*   Lab Work: TODAY: BMET, CBC  If you have labs (blood work) drawn today and your tests are completely normal, you will receive your results only by: [place] (if you have MyChart) OR A paper copy in the mail If you have any lab test that is abnormal or we need to change your treatment, we will call you to review the results.  Follow-Up: At [facility], you and your health needs are our priority.  As part of our continuing mission to provide you with exceptional heart care, we have created designated Provider Care Teams.  These Care Teams include your primary Cardiologist (physician) and Advanced Practice Providers (APPs -  Physician Assistants and Nurse Practitioners) who all work together to provide you with the care you need, when you need it.  Your next appointment:   1 year(s)  The format for your next appointment:   In Person  Provider:   You may see Thompson Grayer, MD or one of the following Advanced Practice Providers on your designated Care Team:   Tommye Standard, [place] [name] "[facility]" [place], [place]

## 2021-09-30 ENCOUNTER — Other Ambulatory Visit: Payer: Self-pay | Admitting: Internal Medicine

## 2021-09-30 DIAGNOSIS — I4821 Permanent atrial fibrillation: Secondary | ICD-10-CM

## 2021-09-30 NOTE — Telephone Encounter (Signed)
Pt last saw Oda Kilts, [place] on 06/15/21, last labs 06/15/21 Creat 0.90, age 76, weight 99.8kg, CrCl 100.11, based on CrCl pt is on appropriate dosage of Xarelto '20mg'$  QD for afib.  Will refill rx.

## 2021-11-15 ENCOUNTER — Telehealth: Payer: Self-pay | Admitting: Internal Medicine

## 2021-11-15 DIAGNOSIS — I4821 Permanent atrial fibrillation: Secondary | ICD-10-CM

## 2021-11-15 NOTE — Telephone Encounter (Signed)
Spoke with patient by phone. He states he has experienced several nose bleeds recently and had difficulty getting them stopped. He stopped Xarelto for 2 days and bleeding resolved. He has an appointment with ENT in September.   I advised he not stop Xalrelto with out approval of his provider as he is at risk of stroke with history of Afib. I advised to try humidified air and nasal moisturizer for nasal dryness. Patient verbalized understanding.   I also advised I would make his provider aware of his nose bleeds and concerns.

## 2021-11-15 NOTE — Telephone Encounter (Signed)
Patient been having nose bleeds   Pt c/o medication issue:  1. Name of Medication: rivaroxaban (XARELTO) 20 MG TABS tablet  2. How are you currently taking this medication (dosage and times per day)? TAKE 1 TABLET BY MOUTH ONCE DAILY WITH SUPPER  3. Are you having a reaction (difficulty breathing--STAT)? no  4. What is your medication issue? Patient been having nose bleeds and the last two days he hasn't had one because he stop taking the medication. Patient calling to see if he should continue not taking the medication. Please advise

## 2021-11-16 NOTE — Telephone Encounter (Signed)
Spoke with patient today who states nose bleeds have resolved and he has started back on Xaralto. Patient is using a saline nasal spray for dryness.  Also advised the patient he has been referred to a new EP MD to take Dr. Jackalyn Lombard place.

## 2021-11-17 DIAGNOSIS — R040 Epistaxis: Secondary | ICD-10-CM | POA: Diagnosis not present

## 2021-11-17 DIAGNOSIS — Z5181 Encounter for therapeutic drug level monitoring: Secondary | ICD-10-CM | POA: Diagnosis not present

## 2021-11-17 DIAGNOSIS — I2699 Other pulmonary embolism without acute cor pulmonale: Secondary | ICD-10-CM | POA: Diagnosis not present

## 2021-11-17 DIAGNOSIS — Z7901 Long term (current) use of anticoagulants: Secondary | ICD-10-CM | POA: Diagnosis not present

## 2021-12-03 DIAGNOSIS — R040 Epistaxis: Secondary | ICD-10-CM | POA: Diagnosis not present

## 2021-12-06 ENCOUNTER — Ambulatory Visit: Payer: Medicare Other | Admitting: Dermatology

## 2022-01-03 DIAGNOSIS — R1013 Epigastric pain: Secondary | ICD-10-CM | POA: Diagnosis not present

## 2022-01-03 DIAGNOSIS — R110 Nausea: Secondary | ICD-10-CM | POA: Diagnosis not present

## 2022-01-05 DIAGNOSIS — R1010 Upper abdominal pain, unspecified: Secondary | ICD-10-CM | POA: Diagnosis not present

## 2022-01-05 DIAGNOSIS — R110 Nausea: Secondary | ICD-10-CM | POA: Diagnosis not present

## 2022-01-24 DIAGNOSIS — R7402 Elevation of levels of lactic acid dehydrogenase (LDH): Secondary | ICD-10-CM | POA: Diagnosis not present

## 2022-01-24 DIAGNOSIS — R7401 Elevation of levels of liver transaminase levels: Secondary | ICD-10-CM | POA: Diagnosis not present

## 2022-01-24 DIAGNOSIS — Z471 Aftercare following joint replacement surgery: Secondary | ICD-10-CM | POA: Diagnosis not present

## 2022-01-24 DIAGNOSIS — R1010 Upper abdominal pain, unspecified: Secondary | ICD-10-CM | POA: Diagnosis not present

## 2022-01-24 DIAGNOSIS — R748 Abnormal levels of other serum enzymes: Secondary | ICD-10-CM | POA: Diagnosis not present

## 2022-01-25 DIAGNOSIS — L814 Other melanin hyperpigmentation: Secondary | ICD-10-CM | POA: Diagnosis not present

## 2022-01-25 DIAGNOSIS — L578 Other skin changes due to chronic exposure to nonionizing radiation: Secondary | ICD-10-CM | POA: Diagnosis not present

## 2022-01-25 DIAGNOSIS — Z23 Encounter for immunization: Secondary | ICD-10-CM | POA: Diagnosis not present

## 2022-01-25 DIAGNOSIS — D229 Melanocytic nevi, unspecified: Secondary | ICD-10-CM | POA: Diagnosis not present

## 2022-01-25 DIAGNOSIS — L821 Other seborrheic keratosis: Secondary | ICD-10-CM | POA: Diagnosis not present

## 2022-03-01 ENCOUNTER — Encounter: Payer: Self-pay | Admitting: *Deleted

## 2022-03-01 ENCOUNTER — Encounter: Payer: Self-pay | Admitting: Cardiology

## 2022-03-01 ENCOUNTER — Ambulatory Visit: Payer: Medicare Other | Attending: Cardiology | Admitting: Cardiology

## 2022-03-01 VITALS — BP 110/72 | HR 60 | Ht 74.0 in | Wt 222.0 lb

## 2022-03-01 DIAGNOSIS — Z01818 Encounter for other preprocedural examination: Secondary | ICD-10-CM | POA: Diagnosis not present

## 2022-03-01 DIAGNOSIS — Z86718 Personal history of other venous thrombosis and embolism: Secondary | ICD-10-CM | POA: Diagnosis not present

## 2022-03-01 DIAGNOSIS — R040 Epistaxis: Secondary | ICD-10-CM | POA: Diagnosis not present

## 2022-03-01 DIAGNOSIS — I4821 Permanent atrial fibrillation: Secondary | ICD-10-CM | POA: Diagnosis not present

## 2022-03-01 LAB — BASIC METABOLIC PANEL
BUN/Creatinine Ratio: 14 (ref 10–24)
BUN: 13 mg/dL (ref 8–27)
CO2: 25 mmol/L (ref 20–29)
Calcium: 9.8 mg/dL (ref 8.6–10.2)
Chloride: 101 mmol/L (ref 96–106)
Creatinine, Ser: 0.92 mg/dL (ref 0.76–1.27)
Glucose: 102 mg/dL — ABNORMAL HIGH (ref 70–99)
Potassium: 4.4 mmol/L (ref 3.5–5.2)
Sodium: 139 mmol/L (ref 134–144)
eGFR: 86 mL/min/{1.73_m2} (ref >59)

## 2022-03-01 NOTE — Patient Instructions (Signed)
Medication Instructions:  No changes *If you need a refill on your cardiac medications before your next appointment, please call your pharmacy*   Lab Work: BMP If you have labs (blood work) drawn today and your tests are completely normal, you will receive your results only by: [place] (if you have MyChart) OR A paper copy in the mail If you have any lab test that is abnormal or we need to change your treatment, we will call you to review the results.   Testing/Procedures: Your physician has requested that you have an echocardiogram. Echocardiography is a painless test that uses sound waves to create images of your heart. It provides your doctor with information about the size and shape of your heart and how well your heart's chambers and valves are working. This procedure takes approximately one hour. There are no restrictions for this procedure. Please do NOT wear cologne, perfume, aftershave, or lotions (deodorant is allowed). Please arrive 15 minutes prior to your appointment time.  Your physician has requested that you have cardiac CT. Cardiac computed tomography (CT) is a painless test that uses an x-ray machine to take clear, detailed pictures of your heart. For further information please visit [email or web address].[email or web address]. Please follow instruction sheet as given.    Your physician has requested that you have Left atrial appendage (LAA) closure device implantation is a procedure to put a small device in the LAA of the heart. The LAA is a small sac in the wall of the heart's left upper chamber. Blood clots can form in this area. The device, Watchman closes the LAA to help prevent a blood clot and stroke.    Follow-Up: At [facility], you and your health needs are our priority.  As part of our continuing mission to provide you with exceptional heart care, we have created designated Provider Care Teams.  These Care Teams include your primary Cardiologist (physician) and  Advanced Practice Providers (APPs -  Physician Assistants and Nurse Practitioners) who all work together to provide you with the care you need, when you need it.  We recommend signing up for the patient portal called "MyChart".  Sign up information is provided on this After Visit Summary.  MyChart is used to connect with patients for Virtual Visits (Telemedicine).  Patients are able to view lab/test results, encounter notes, upcoming appointments, etc.  Non-urgent messages can be sent to your provider as well.   To learn more about what you can do with MyChart, go to [email or web address].    Your next appointment:   Lenice Llamas, the Watchman Nurse Navigator, will call you after your CT once the [facility] Team has reviewed your imaging for an update on proceedings. [place]'s direct number is [phone] if you need assistance.   Important Information About Sugar

## 2022-03-01 NOTE — Progress Notes (Signed)
Electrophysiology Office Follow up Visit Note:    Date:  03/01/2022   ID:  Zachary Sanchez, DOB [birth year], MRN [id number]  PCP:  Zachary Late, MD  [facility] Cardiologist:  None  [facility] Electrophysiologist:  Zachary Epley, MD    Interval History:    Zachary Sanchez is a 76 y.o. male who presents for a follow up visit. Previously followed by Dr. Rayann Sanchez. They were last seen in clinic 06/15/2021 by Zachary Ellison, PA-C. At that visit his EKG showed permanent atrial fibrillation at 53 bpm. His Afib was rate controlled and he was asymptomatic. He was continued on Xarelto for prior DVT/PE.  He called the office 11/15/2021 and reported having several episodes of epistaxis. He had stopped Xarelto for 2 days until his bleeding resolved. He was advised to try a saline nasal spray for dryness.    Today, he confirms that his epistaxis episodes lasted intermittently for several weeks. He has seen ENT and underwent cauterization which seems to have resolved his symptoms. No further epistaxis or other bleeding issues since then.  He denies any palpitations, chest pain, shortness of breath, or peripheral edema. No lightheadedness, headaches, syncope, orthopnea, or PND.      Past Medical History:  Diagnosis Date   Aortic root enlargement (HCC)    aortic root 50m in size   Atrial flutter (HCC)    Biatrial enlargement    Colonic polyp    DJD (degenerative joint disease)    DVT (deep venous thrombosis) ([facility] 03[place]1016   post knee surgery, on eliquis   Dysrhythmia    a fib   Gout    Heart murmur    when younger   History of benign prostatic hypertrophy    Hyperlipidemia    Persistent atrial fibrillation (HCC)    longstanding persistent, asymptomatic   Pneumonia    Prostate cancer ([facility]    Pulmonary emboli (Zachary Sanchez 03/2016   from DVT post knee surgery   Rupture of quadriceps tendon 05/10/2017   Wears glasses     Past Surgical History:  Procedure Laterality Date   FRACTURE  SURGERY     ankles and fingers   INGUINAL HERNIA REPAIR Left 12/16/2020   Procedure: LAPAROSCOPIC LEFT AND  RIGHT INGUINAL HERNIA REPAIR; TAP BLOCKS;  Surgeon: Zachary Boston MD;  [facility];  Service: General;  Laterality: Left;  GEN AND LOCAL   KNEE SURGERY     right; menicus tear    LAPAROSCOPIC CHOLECYSTECTOMY SINGLE SITE WITH INTRAOPERATIVE CHOLANGIOGRAM N/A 09/04/2020   Procedure: LAPAROSCOPIC CHOLECYSTECTOMY SINGLE SITE WITH INTRAOPERATIVE CHOLANGIOGRAM BILATERAL TAP BLOCK;  Surgeon: Zachary Boston MD;  [facility];  Service: General;  Laterality: N/A;   LYMPHADENECTOMY Bilateral 07/09/2015   Procedure: PELVIC LYMPHADENECTOMY;  Surgeon: Zachary Bring MD;  [facility];  Service: Urology;  Laterality: Bilateral;   QUADRICEPS REPAIR     ROBOT ASSISTED LAPAROSCOPIC RADICAL PROSTATECTOMY N/A 07/09/2015   Procedure: XI ROBOTIC ASSISTED LAPAROSCOPIC RADICAL PROSTATECTOMY LEVEL 2;  Surgeon: Zachary Bring MD;  [facility];  Service: Urology;  Laterality: N/A;   TONSILLECTOMY     TOTAL KNEE ARTHROPLASTY Right 03/04/2016   Procedure: RIGHT TOTAL KNEE ARTHROPLASTY;  Surgeon: Zachary Cabal MD;  [facility];  Service: Orthopedics;  Laterality: Right;    Current Medications: No outpatient medications have been marked as taking for the 03/01/22 encounter (Office Visit) with LVickie Epley MD.     Allergies:   Patient has no known allergies.   Social History  Socioeconomic History   Marital status: Married    Spouse name: Not on file   Number of children: Not on file   Years of education: Not on file   Highest education level: Not on file  Occupational History   Occupation: Engineer, maintenance (IT)    Employer: [employer]  Tobacco Use   Smoking status: Former    Packs/day: 1.00    Years: 20.00    Total pack years: 20.00    Types: Cigarettes    Quit date: 02/11/1979    Years since quitting: 43.0   Smokeless tobacco: Never  Vaping Use   Vaping Use: Never  used  Substance and Sexual Activity   Alcohol use: Yes    Alcohol/week: 21.0 standard drinks of alcohol    Types: 21 Glasses of wine per week    Comment: 2-3 glasses of wine daily   Drug use: No   Sexual activity: Not Currently  Other Topics Concern   Not on file  Social History Narrative   Not on file   Social Determinants of Health   Financial Resource Strain: Not on file  Food Insecurity: Not on file  Transportation Needs: Not on file  Physical Activity: Not on file  Stress: Not on file  Social Connections: Not on file     Family History: The patient's family history includes Alcohol abuse in his father; Cancer in his brother; Heart attack in his father.  ROS:   Please see the history of present illness.     All other systems reviewed and are negative.  EKGs/Labs/Other Studies Reviewed:    The following studies were reviewed today:  Echo  08/09/2016: Study Conclusions   - Left ventricle: The cavity size was normal. Wall thickness was    normal. Systolic function was normal. The estimated ejection    fraction was in the range of 55% to 60%. Wall motion was normal;    there were no regional wall motion abnormalities.  - Mitral valve: There was mild regurgitation.  - Left atrium: The atrium was moderately to severely dilated.    Recent Labs: 06/15/2021: BUN 12; Creatinine, Ser 0.90; Hemoglobin 14.7; Platelets 197; Potassium 4.6; Sodium 139   Recent Lipid Panel No results found for: "CHOL", "TRIG", "HDL", "CHOLHDL", "VLDL", "LDLCALC", "LDLDIRECT"  Physical Exam:    VS:  BP 110/72   Pulse 60   Ht '6\' 2"'$  (1.88 m)   Wt 222 lb (100.7 kg)   SpO2 97%   BMI 28.50 kg/m     Wt Readings from Last 3 Encounters:  03/01/22 222 lb (100.7 kg)  06/15/21 220 lb (99.8 kg)  12/14/20 210 lb (95.3 kg)     GEN: Well nourished, well developed in no acute distress.  Appears younger than stated age 23: Normal NECK: No JVD; No carotid bruits LYMPHATICS: No  lymphadenopathy CARDIAC: Irregularly irregular, no murmurs, rubs, gallops RESPIRATORY:  Clear to auscultation without rales, wheezing or rhonchi  ABDOMEN: Soft, non-tender, non-distended MUSCULOSKELETAL:  No edema; No deformity  SKIN: Warm and dry NEUROLOGIC:  Alert and oriented x 3 PSYCHIATRIC:  Normal affect       ASSESSMENT:    1. Permanent atrial fibrillation ([place])   2. History of DVT (deep vein thrombosis)   3. Epistaxis    PLAN:    In order of problems listed above:  #Permanent atrial fibrillation Currently on Xarelto but would like to avoid long-term exposure to anticoagulation given her recent severe nosebleed requiring cauterization.  We discussed the  procedure in detail and he would like to proceed.  ----------------  I have seen Lavenia Atlas in the office today who is being considered for a Watchman left atrial appendage closure device. I believe they will benefit from this procedure given their history of atrial fibrillation, CHA2DS2-VASc score of 3 and unadjusted ischemic stroke rate of 3.2% per year. Unfortunately, the patient is not felt to be a long term anticoagulation candidate secondary to severe nose bleeding. The patient's chart has been reviewed and I feel that they would be a candidate for short term oral anticoagulation after Watchman implant.   It is my belief that after undergoing a LAA closure procedure, Zachary Sanchez will not need long term anticoagulation which eliminates anticoagulation side effects and major bleeding risk.   Procedural risks for the Watchman implant have been reviewed with the patient including a 0.5% risk of stroke, <1% risk of perforation and <1% risk of device embolization. Other risks include bleeding, vascular damage, tamponade, worsening renal function, and death. The patient understands these risk and wishes to proceed.     The published clinical data on the safety and effectiveness of WATCHMAN include but are not limited to the  following: - Holmes DR, Mechele Claude, Sick P et al. for the PROTECT AF Investigators. Percutaneous closure of the left atrial appendage versus warfarin therapy for prevention of stroke in patients with atrial fibrillation: a randomised non-inferiority trial. Lancet 2009; 374: 534-42. Mechele Claude, Doshi SK, Abelardo Diesel D et al. on behalf of the PROTECT AF Investigators. Percutaneous Left Atrial Appendage Closure for Stroke Prophylaxis in Patients With Atrial Fibrillation 2.3-Year Follow-up of the PROTECT AF (Watchman Left Atrial Appendage System for Embolic Protection in Patients With Atrial Fibrillation) Trial. Circulation 2013; 127:720-729. - Alli O, Doshi S,  Kar S, Reddy VY, Sievert H et al. Quality of Life Assessment in the Randomized PROTECT AF (Percutaneous Closure of the Left Atrial Appendage Versus Warfarin Therapy for Prevention of Stroke in Patients With Atrial Fibrillation) Trial of Patients at Risk for Stroke With Nonvalvular Atrial Fibrillation. J Am Coll Cardiol 2013; 15:3794-3. Vertell Limber DR, Tarri Abernethy, Price M, [place], Sievert H, Doshi S, Huber K, Reddy V. Prospective randomized evaluation of the Watchman left atrial appendage Device in patients with atrial fibrillation versus long-term warfarin therapy; the PREVAIL trial. Journal of the [employer] of Cardiology, Vol. 4, No. 1, 2014, 1-11. - Kar S, Doshi SK, Sadhu A, Horton R, Osorio J et al. Primary outcome evaluation of a next-generation left atrial appendage closure device: results from the PINNACLE FLX trial. Circulation 2021;143(18)1754-1762.    After today's visit with the patient which was dedicated solely for shared decision making visit regarding LAA closure device, the patient decided to proceed with the LAA appendage closure procedure scheduled to be done in the near future at [facility]. Prior to the procedure, I would like to obtain a gated CT scan of the chest with contrast timed for PV/LA visualization.    HAS-BLED score 2 Hypertension No  Abnormal renal and liver function (Dialysis, transplant, Cr >2.26 mg/dL /Cirrhosis or Bilirubin >2x Normal or AST/ALT/AP >3x Normal) No  Stroke No  Bleeding Yes  Labile INR (Unstable/high INR) No  Elderly (>65) Yes  Drugs or alcohol (? 8 drinks/week, anti-plt or NSAID) No   CHA2DS2-VASc Score = 3  The patient's score is based upon: CHF History: 0 HTN History: 0 Diabetes History: 0 Stroke History: 0 Vascular Disease History: 1 Age Score:  2 Gender Score: 0        Medication Adjustments/Labs and Tests Ordered: Current medicines are reviewed at length with the patient today.  Concerns regarding medicines are outlined above.   No orders of the defined types were placed in this encounter.  No orders of the defined types were placed in this encounter.  I,Mathew Stumpf,acting as a Education administrator for Zachary Epley, MD.,have documented all relevant documentation on the behalf of Zachary Epley, MD,as directed by  Zachary Epley, MD while in the presence of Zachary Epley, MD.  I, Zachary Epley, MD, have reviewed all documentation for this visit. The documentation on 03/01/22 for the exam, diagnosis, procedures, and orders are all accurate and complete.   Signed, Lars Mage, MD, [facility], [facility] 03/01/2022 9:19 AM    Electrophysiology [facility]

## 2022-03-25 ENCOUNTER — Telehealth (HOSPITAL_COMMUNITY): Payer: Self-pay | Admitting: *Deleted

## 2022-03-25 NOTE — Telephone Encounter (Signed)
Attempted to call patient regarding upcoming cardiac CT appointment. °Left message on voicemail with name and callback number ° °Yan Pankratz RN Navigator Cardiac Imaging °[facility] °[phone] Office °[phone] Cell ° °

## 2022-03-28 ENCOUNTER — Ambulatory Visit (HOSPITAL_COMMUNITY)
Admission: RE | Admit: 2022-03-28 | Discharge: 2022-03-28 | Disposition: A | Discharge status: Home / Self Care | Payer: Medicare Other | Source: Ambulatory Visit | Attending: Cardiology | Admitting: Cardiology

## 2022-03-28 DIAGNOSIS — R040 Epistaxis: Secondary | ICD-10-CM | POA: Insufficient documentation

## 2022-03-28 DIAGNOSIS — Z01818 Encounter for other preprocedural examination: Secondary | ICD-10-CM | POA: Diagnosis not present

## 2022-03-28 DIAGNOSIS — Z86718 Personal history of other venous thrombosis and embolism: Secondary | ICD-10-CM | POA: Diagnosis not present

## 2022-03-28 DIAGNOSIS — I4821 Permanent atrial fibrillation: Secondary | ICD-10-CM | POA: Diagnosis not present

## 2022-03-28 MED ORDER — IOHEXOL 350 MG/ML SOLN
95.0000 mL | Freq: Once | INTRAVENOUS | Status: AC | PRN
  Administered 2022-03-28: 95 mL via INTRAVENOUS

## 2022-03-30 ENCOUNTER — Ambulatory Visit (HOSPITAL_COMMUNITY): Payer: Medicare Other | Attending: Cardiology

## 2022-03-30 DIAGNOSIS — I2699 Other pulmonary embolism without acute cor pulmonale: Secondary | ICD-10-CM | POA: Insufficient documentation

## 2022-03-30 DIAGNOSIS — Z86718 Personal history of other venous thrombosis and embolism: Secondary | ICD-10-CM | POA: Diagnosis not present

## 2022-03-30 DIAGNOSIS — Z01818 Encounter for other preprocedural examination: Secondary | ICD-10-CM

## 2022-03-30 DIAGNOSIS — R040 Epistaxis: Secondary | ICD-10-CM | POA: Diagnosis not present

## 2022-03-30 DIAGNOSIS — Z01810 Encounter for preprocedural cardiovascular examination: Secondary | ICD-10-CM | POA: Diagnosis not present

## 2022-03-30 DIAGNOSIS — E785 Hyperlipidemia, unspecified: Secondary | ICD-10-CM | POA: Diagnosis not present

## 2022-03-30 DIAGNOSIS — I4821 Permanent atrial fibrillation: Secondary | ICD-10-CM | POA: Insufficient documentation

## 2022-03-30 DIAGNOSIS — I071 Rheumatic tricuspid insufficiency: Secondary | ICD-10-CM | POA: Diagnosis not present

## 2022-03-30 LAB — ECHOCARDIOGRAM COMPLETE
AR max vel: 5.12 cm2
AV Area VTI: 4.99 cm2
AV Area mean vel: 5.12 cm2
AV Mean grad: 2 mmHg
AV Peak grad: 3.3 mmHg
Ao pk vel: 0.91 m/s
Area-P 1/2: 3.05 cm2
S' Lateral: 3.2 cm

## 2022-04-11 IMAGING — RF DG CHOLANGIOGRAM OPERATIVE
1 series · 5 of 5 positions shown · non-contrast
Comparison: None.

CLINICAL DATA: 74-year-old male with history of cholelithiasis
undergoing laparoscopic cholecystectomy.

EXAM:
INTRAOPERATIVE CHOLANGIOGRAM
TECHNIQUE: Cholangiographic images from the C-arm fluoroscopic device were
submitted for interpretation post-operatively. Please see the
procedural report for the amount of contrast and the fluoroscopy
time utilized.

[Series 1: run · 2 acquisitions, 5 frames shown]
[im 1/2]
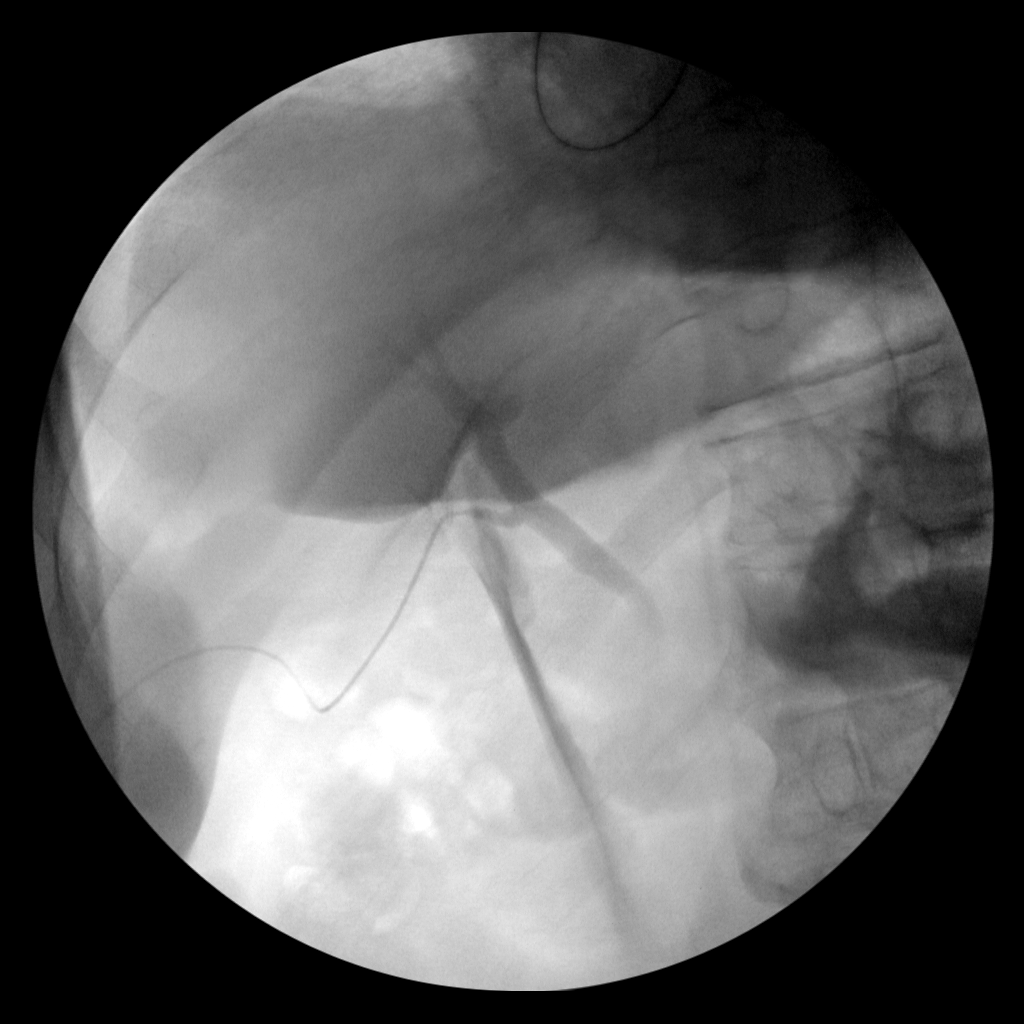
[im 1/2]
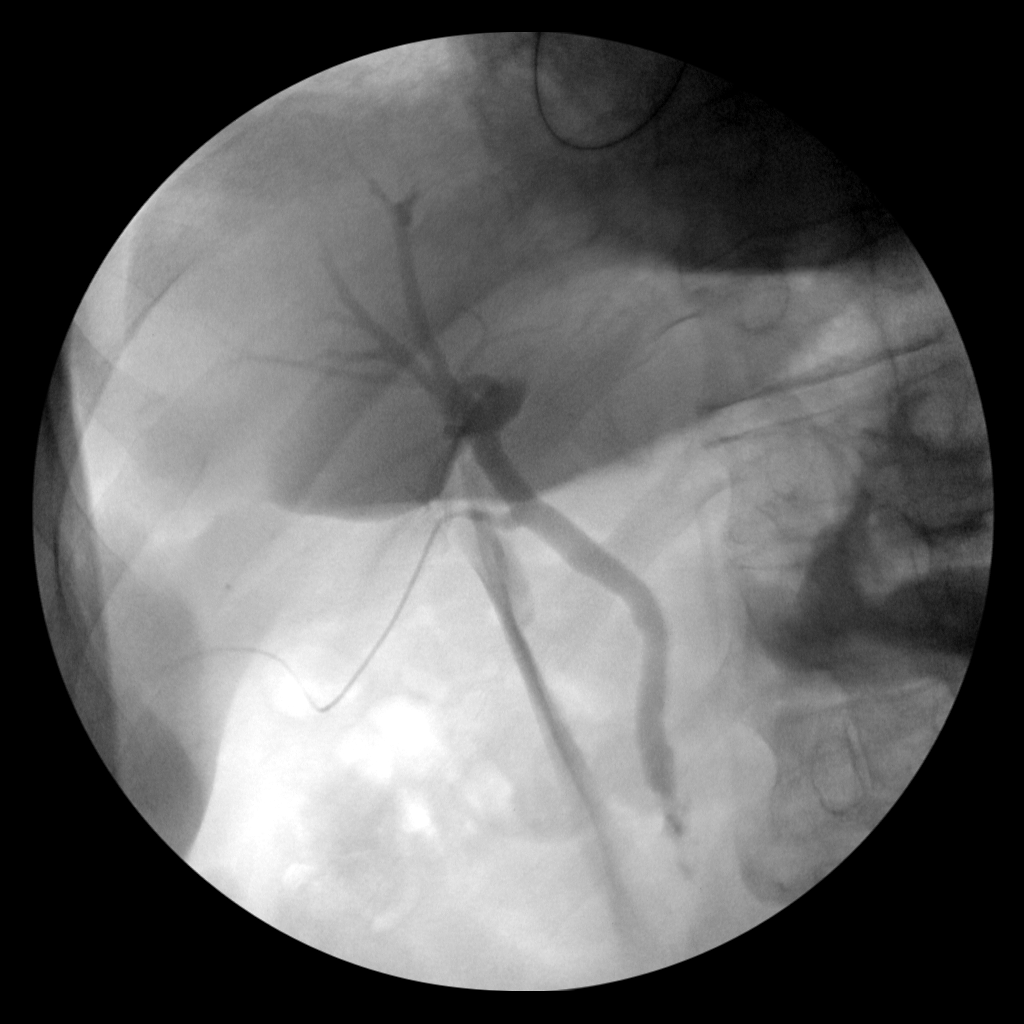
[im 1/2]
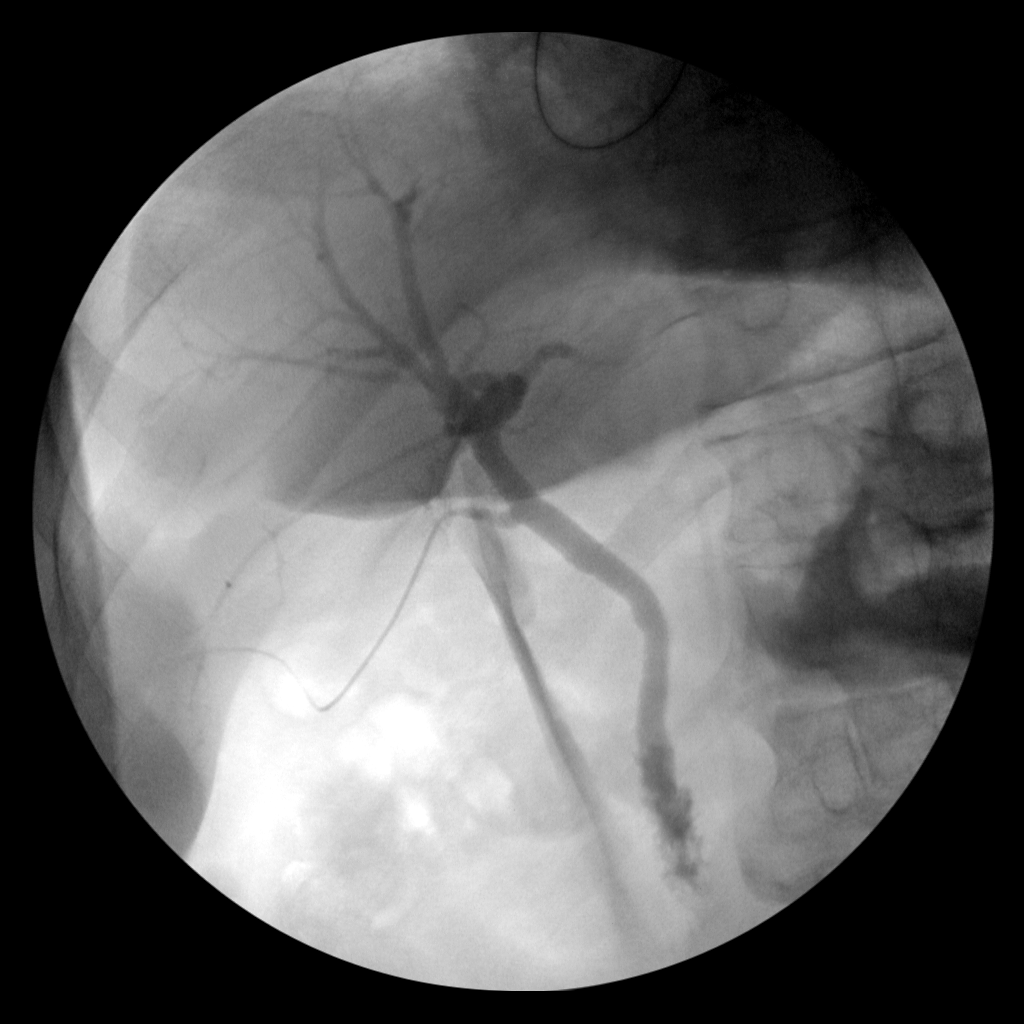
[im 1/2]
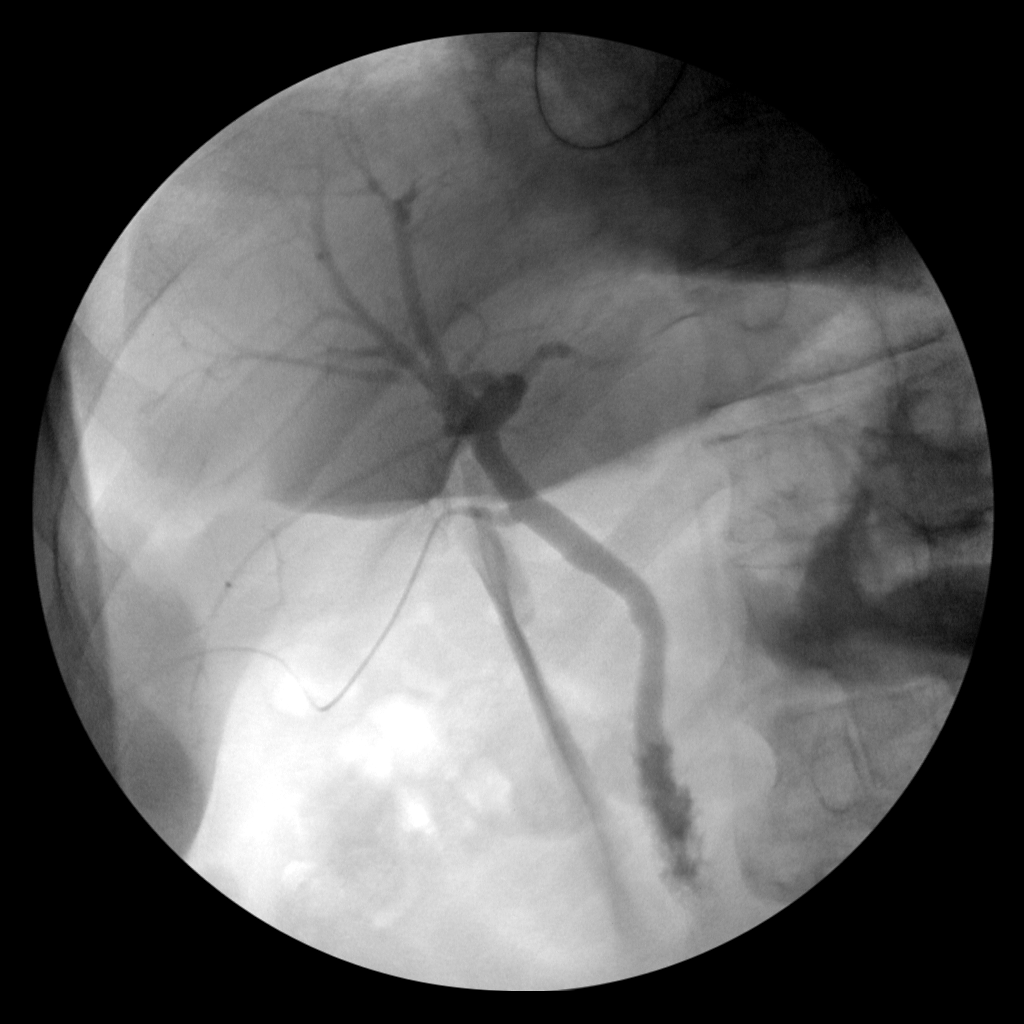
[im 2/2]
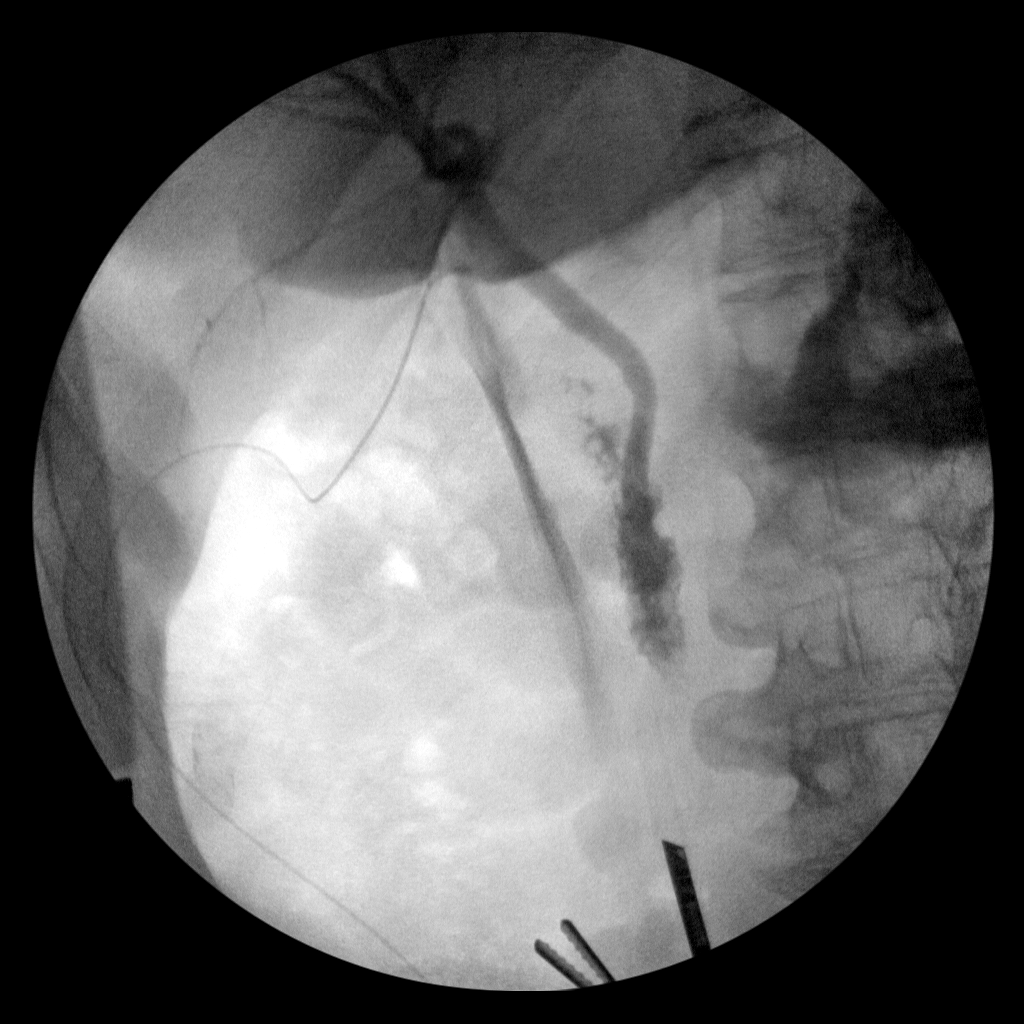

[5 of 5 positions shown; findings below may reference images not displayed]

FINDINGS: Intraoperative antegrade injection via the cystic duct which
opacifies the common bile duct and central portions of the
intrahepatic biliary tree. Contrast is visualized flowing freely
into the duodenum. There are no filling defects. No significant
intra or extrahepatic biliary ductal dilation. No apparent anomalous
anatomical configuration of the biliary tree.
IMPRESSION: No evidence of choledocholithiasis.

## 2022-04-19 ENCOUNTER — Encounter: Payer: Self-pay | Admitting: Cardiology

## 2022-04-19 NOTE — Telephone Encounter (Signed)
Patient is requesting a callback regarding this.

## 2022-04-20 ENCOUNTER — Other Ambulatory Visit: Payer: Self-pay

## 2022-04-20 DIAGNOSIS — I4821 Permanent atrial fibrillation: Secondary | ICD-10-CM

## 2022-04-20 DIAGNOSIS — R040 Epistaxis: Secondary | ICD-10-CM

## 2022-05-03 ENCOUNTER — Ambulatory Visit (HOSPITAL_BASED_OUTPATIENT_CLINIC_OR_DEPARTMENT_OTHER): Payer: Medicare Other | Admitting: Certified Registered Nurse Anesthetist

## 2022-05-03 ENCOUNTER — Other Ambulatory Visit: Payer: Self-pay

## 2022-05-03 ENCOUNTER — Encounter (HOSPITAL_COMMUNITY): Payer: Self-pay | Admitting: Otolaryngology

## 2022-05-03 ENCOUNTER — Ambulatory Visit (HOSPITAL_COMMUNITY): Payer: Medicare Other | Admitting: Certified Registered Nurse Anesthetist

## 2022-05-03 ENCOUNTER — Ambulatory Visit (HOSPITAL_COMMUNITY)
Admission: RE | Admit: 2022-05-03 | Discharge: 2022-05-03 | Disposition: A | Discharge status: Home / Self Care | Payer: Medicare Other | Source: Other Acute Inpatient Hospital | Attending: Otolaryngology | Admitting: Otolaryngology

## 2022-05-03 ENCOUNTER — Encounter (HOSPITAL_COMMUNITY)
Admission: RE | Disposition: A | Discharge status: Home / Self Care | Payer: Self-pay | Source: Other Acute Inpatient Hospital | Attending: Otolaryngology

## 2022-05-03 DIAGNOSIS — I2699 Other pulmonary embolism without acute cor pulmonale: Secondary | ICD-10-CM

## 2022-05-03 DIAGNOSIS — Z86718 Personal history of other venous thrombosis and embolism: Secondary | ICD-10-CM | POA: Diagnosis not present

## 2022-05-03 DIAGNOSIS — Z7901 Long term (current) use of anticoagulants: Secondary | ICD-10-CM | POA: Insufficient documentation

## 2022-05-03 DIAGNOSIS — I4811 Longstanding persistent atrial fibrillation: Secondary | ICD-10-CM | POA: Insufficient documentation

## 2022-05-03 DIAGNOSIS — Z87891 Personal history of nicotine dependence: Secondary | ICD-10-CM | POA: Insufficient documentation

## 2022-05-03 DIAGNOSIS — I82409 Acute embolism and thrombosis of unspecified deep veins of unspecified lower extremity: Secondary | ICD-10-CM

## 2022-05-03 DIAGNOSIS — J3489 Other specified disorders of nose and nasal sinuses: Secondary | ICD-10-CM | POA: Diagnosis not present

## 2022-05-03 DIAGNOSIS — R040 Epistaxis: Secondary | ICD-10-CM | POA: Diagnosis not present

## 2022-05-03 DIAGNOSIS — I4891 Unspecified atrial fibrillation: Secondary | ICD-10-CM | POA: Diagnosis not present

## 2022-05-03 DIAGNOSIS — M1990 Unspecified osteoarthritis, unspecified site: Secondary | ICD-10-CM | POA: Diagnosis not present

## 2022-05-03 HISTORY — PX: NASAL ENDOSCOPY WITH EPISTAXIS CONTROL: SHX5664

## 2022-05-03 LAB — CBC
HCT: 42.8 % (ref 39.0–52.0)
Hemoglobin: 14.6 g/dL (ref 13.0–17.0)
MCH: 34.6 pg — ABNORMAL HIGH (ref 26.0–34.0)
MCHC: 34.1 g/dL (ref 30.0–36.0)
MCV: 101.4 fL — ABNORMAL HIGH (ref 80.0–100.0)
Platelets: 199 10*3/uL (ref 150–400)
RBC: 4.22 MIL/uL (ref 4.22–5.81)
RDW: 12.6 % (ref 11.5–15.5)
WBC: 7.4 10*3/uL (ref 4.0–10.5)
nRBC: 0 % (ref 0.0–0.2)

## 2022-05-03 LAB — BASIC METABOLIC PANEL
Anion gap: 10 (ref 5–15)
BUN: 16 mg/dL (ref 8–23)
CO2: 22 mmol/L (ref 22–32)
Calcium: 9.6 mg/dL (ref 8.9–10.3)
Chloride: 104 mmol/L (ref 98–111)
Creatinine, Ser: 0.79 mg/dL (ref 0.61–1.24)
GFR, Estimated: >60 mL/min (ref >60)
Glucose, Bld: 105 mg/dL — ABNORMAL HIGH (ref 70–99)
Potassium: 3.7 mmol/L (ref 3.5–5.1)
Sodium: 136 mmol/L (ref 135–145)

## 2022-05-03 SURGERY — CONTROL OF EPISTAXIS, ENDOSCOPIC
Anesthesia: General | Site: Nose

## 2022-05-03 MED ORDER — ONDANSETRON HCL 4 MG/2ML IJ SOLN
INTRAMUSCULAR | Status: AC
  Filled 2022-05-03: qty 2

## 2022-05-03 MED ORDER — FENTANYL CITRATE (PF) 250 MCG/5ML IJ SOLN
INTRAMUSCULAR | Status: DC | PRN
  Administered 2022-05-03 (×3): 50 ug via INTRAVENOUS

## 2022-05-03 MED ORDER — LIDOCAINE 2% (20 MG/ML) 5 ML SYRINGE
INTRAMUSCULAR | Status: AC
  Filled 2022-05-03: qty 5

## 2022-05-03 MED ORDER — BACITRACIN ZINC 500 UNIT/GM EX OINT
TOPICAL_OINTMENT | CUTANEOUS | Status: AC
  Filled 2022-05-03: qty 28.35

## 2022-05-03 MED ORDER — ONDANSETRON HCL 4 MG/2ML IJ SOLN
INTRAMUSCULAR | Status: DC | PRN
  Administered 2022-05-03: 4 mg via INTRAVENOUS

## 2022-05-03 MED ORDER — SUCCINYLCHOLINE CHLORIDE 200 MG/10ML IV SOSY
PREFILLED_SYRINGE | INTRAVENOUS | Status: AC
  Filled 2022-05-03: qty 10

## 2022-05-03 MED ORDER — LIDOCAINE-EPINEPHRINE 1 %-1:100000 IJ SOLN
INTRAMUSCULAR | Status: DC | PRN
  Administered 2022-05-03: 4 mL

## 2022-05-03 MED ORDER — LACTATED RINGERS IV SOLN: INTRAVENOUS | Status: DC

## 2022-05-03 MED ORDER — DEXAMETHASONE SODIUM PHOSPHATE 10 MG/ML IJ SOLN
INTRAMUSCULAR | Status: AC
  Filled 2022-05-03: qty 1

## 2022-05-03 MED ORDER — SUCCINYLCHOLINE CHLORIDE 200 MG/10ML IV SOSY
PREFILLED_SYRINGE | INTRAVENOUS | Status: DC | PRN
  Administered 2022-05-03: 100 mg via INTRAVENOUS

## 2022-05-03 MED ORDER — OXYMETAZOLINE HCL 0.05 % NA SOLN
NASAL | Status: AC
  Filled 2022-05-03: qty 30

## 2022-05-03 MED ORDER — PROMETHAZINE HCL 25 MG/ML IJ SOLN: 6.2500 mg | INTRAMUSCULAR | Status: DC | PRN

## 2022-05-03 MED ORDER — DEXAMETHASONE SODIUM PHOSPHATE 10 MG/ML IJ SOLN
INTRAMUSCULAR | Status: DC | PRN
  Administered 2022-05-03: 10 mg via INTRAVENOUS

## 2022-05-03 MED ORDER — FENTANYL CITRATE (PF) 100 MCG/2ML IJ SOLN: 25.0000 ug | INTRAMUSCULAR | Status: DC | PRN

## 2022-05-03 MED ORDER — SALINE SPRAY 0.65 % NA SOLN
1.0000 | NASAL | Status: DC | PRN
  Filled 2022-05-03: qty 44

## 2022-05-03 MED ORDER — LIDOCAINE-EPINEPHRINE 1 %-1:100000 IJ SOLN
INTRAMUSCULAR | Status: AC
  Filled 2022-05-03: qty 1

## 2022-05-03 MED ORDER — PROPOFOL 10 MG/ML IV BOLUS
INTRAVENOUS | Status: DC | PRN
  Administered 2022-05-03: 160 mg via INTRAVENOUS
  Administered 2022-05-03: 40 mg via INTRAVENOUS

## 2022-05-03 MED ORDER — CHLORHEXIDINE GLUCONATE 0.12 % MT SOLN
15.0000 mL | OROMUCOSAL | Status: AC
  Administered 2022-05-03: 15 mL via OROMUCOSAL
  Filled 2022-05-03: qty 15

## 2022-05-03 MED ORDER — ROCURONIUM BROMIDE 10 MG/ML (PF) SYRINGE
PREFILLED_SYRINGE | INTRAVENOUS | Status: AC
  Filled 2022-05-03: qty 10

## 2022-05-03 MED ORDER — AMISULPRIDE (ANTIEMETIC) 5 MG/2ML IV SOLN
INTRAVENOUS | Status: AC
  Filled 2022-05-03: qty 4

## 2022-05-03 MED ORDER — ARTIFICIAL TEARS OPHTHALMIC OINT
TOPICAL_OINTMENT | OPHTHALMIC | Status: AC
  Filled 2022-05-03: qty 3.5

## 2022-05-03 MED ORDER — LIDOCAINE 2% (20 MG/ML) 5 ML SYRINGE
INTRAMUSCULAR | Status: DC | PRN
  Administered 2022-05-03: 60 mg via INTRAVENOUS

## 2022-05-03 MED ORDER — FENTANYL CITRATE (PF) 250 MCG/5ML IJ SOLN
INTRAMUSCULAR | Status: AC
  Filled 2022-05-03: qty 5

## 2022-05-03 MED ORDER — OXYMETAZOLINE HCL 0.05 % NA SOLN
NASAL | Status: DC | PRN
  Administered 2022-05-03: 1 via TOPICAL

## 2022-05-03 MED ORDER — ACETAMINOPHEN 10 MG/ML IV SOLN
INTRAVENOUS | Status: AC
  Filled 2022-05-03: qty 100

## 2022-05-03 MED ORDER — AMISULPRIDE (ANTIEMETIC) 5 MG/2ML IV SOLN
10.0000 mg | Freq: Once | INTRAVENOUS | Status: AC | PRN
  Administered 2022-05-03: 10 mg via INTRAVENOUS

## 2022-05-03 MED ORDER — OXYMETAZOLINE HCL 0.05 % NA SOLN
2.0000 | NASAL | Status: DC
  Administered 2022-05-03: 2 via NASAL
  Filled 2022-05-03: qty 30

## 2022-05-03 MED ORDER — ACETAMINOPHEN 10 MG/ML IV SOLN
INTRAVENOUS | Status: DC | PRN
  Administered 2022-05-03: 1000 mg via INTRAVENOUS

## 2022-05-03 MED ORDER — PROPOFOL 10 MG/ML IV BOLUS
INTRAVENOUS | Status: AC
  Filled 2022-05-03: qty 20

## 2022-05-03 MED ORDER — HEMOSTATIC AGENTS (NO CHARGE) OPTIME
TOPICAL | Status: DC | PRN
  Administered 2022-05-03: 1 via TOPICAL

## 2022-05-03 MED ORDER — 0.9 % SODIUM CHLORIDE (POUR BTL) OPTIME
TOPICAL | Status: DC | PRN
  Administered 2022-05-03: 1000 mL

## 2022-05-03 SURGICAL SUPPLY — 44 items
ATTRACTOMAT 16X20 MAGNETIC DRP (DRAPES) IMPLANT
BAG COUNTER SPONGE SURGICOUNT (BAG) ×1 IMPLANT
BAG SPNG CNTER NS LX DISP (BAG) ×1
BLADE RAD40 ROTATE 4M 4 5PK (BLADE) IMPLANT
BLADE RAD60 ROTATE M4 4 5PK (BLADE) IMPLANT
BLADE SURG 15 STRL LF DISP TIS (BLADE) IMPLANT
BLADE SURG 15 STRL SS (BLADE)
BLADE TRICUT ROTATE M4 4 5PK (BLADE) IMPLANT
CANISTER SUCT 3000ML PPV (MISCELLANEOUS) ×1 IMPLANT
COAGULATOR SUCT 8FR VV (MISCELLANEOUS) ×1 IMPLANT
DRAPE HALF SHEET 40X57 (DRAPES) IMPLANT
DRESSING NASAL KENNEDY 3.5X.9 (MISCELLANEOUS) IMPLANT
DRESSING NASAL POPE 10X1.5X2.5 (GAUZE/BANDAGES/DRESSINGS) IMPLANT
DRSG NASAL KENNEDY 3.5X.9 (MISCELLANEOUS)
DRSG NASAL POPE 10X1.5X2.5 (GAUZE/BANDAGES/DRESSINGS) ×1
DRSG NASOPORE 8CM (GAUZE/BANDAGES/DRESSINGS) IMPLANT
ELECT COATED BLADE 2.86 ST (ELECTRODE) ×1 IMPLANT
ELECT REM PT RETURN 9FT ADLT (ELECTROSURGICAL) ×1
ELECTRODE REM PT RTRN 9FT ADLT (ELECTROSURGICAL) IMPLANT
FILTER ARTHROSCOPY CONVERTOR (FILTER) IMPLANT
GLOVE ECLIPSE 7.5 STRL STRAW (GLOVE) ×2 IMPLANT
GOWN STRL REUS W/ TWL LRG LVL3 (GOWN DISPOSABLE) ×2 IMPLANT
GOWN STRL REUS W/TWL LRG LVL3 (GOWN DISPOSABLE) ×2
HEMOSTAT SURGICEL 2X14 (HEMOSTASIS) IMPLANT
KIT BASIN OR (CUSTOM PROCEDURE TRAY) ×1 IMPLANT
KIT TURNOVER KIT B (KITS) ×1 IMPLANT
NDL PRECISIONGLIDE 27X1.5 (NEEDLE) ×1 IMPLANT
NEEDLE PRECISIONGLIDE 27X1.5 (NEEDLE) ×1 IMPLANT
NS IRRIG 1000ML POUR BTL (IV SOLUTION) ×1 IMPLANT
PAD ARMBOARD 7.5X6 YLW CONV (MISCELLANEOUS) ×2 IMPLANT
PATTIES SURGICAL .5 X3 (DISPOSABLE) ×1 IMPLANT
PENCIL FOOT CONTROL (ELECTRODE) ×1 IMPLANT
SHEATH ENDOSCRUB 0 DEG (SHEATH) IMPLANT
SHEATH ENDOSCRUB 30 DEG (SHEATH) IMPLANT
SPECIMEN JAR SMALL (MISCELLANEOUS) ×1 IMPLANT
SUCTION FRAZIER HANDLE 10FR (MISCELLANEOUS) ×2
SUCTION TUBE FRAZIER 10FR DISP (MISCELLANEOUS) IMPLANT
SWAB COLLECTION DEVICE MRSA (MISCELLANEOUS) IMPLANT
SWAB CULTURE ESWAB REG 1ML (MISCELLANEOUS) IMPLANT
SYR 50ML SLIP (SYRINGE) IMPLANT
TOWEL GREEN STERILE FF (TOWEL DISPOSABLE) ×1 IMPLANT
TRAY ENT MC OR (CUSTOM PROCEDURE TRAY) ×1 IMPLANT
TUBING EXTENTION W/L.L. (IV SETS) ×1 IMPLANT
WATER STERILE IRR 1000ML POUR (IV SOLUTION) ×1 IMPLANT

## 2022-05-03 NOTE — H&P (Signed)
Zachary Sanchez is an 77 y.o. male.   Chief Complaint: Epistaxis HPI: He has had very epistaxis all day today since this morning.  He had something similar a few months ago and had cauterization performed.  It started on the right side.  He has a known perforation of the septum following nasal septal surgery about 40 years ago.  He is on Xarelto for atrial fibrillation.  Past Medical History:  Diagnosis Date   Aortic root enlargement (HCC)    aortic root 48m in size   Atrial flutter (HCC)    Biatrial enlargement    Colonic polyp    DJD (degenerative joint disease)    DVT (deep venous thrombosis) ([facility] 03[facility]1016   post knee surgery, on eliquis   Dysrhythmia    a fib   Gout    Heart murmur    when younger   History of benign prostatic hypertrophy    Hyperlipidemia    Persistent atrial fibrillation (HCC)    longstanding persistent, asymptomatic   Pneumonia    [place]state cancer [place] [place]    [place]ulmonary [place]oli ([facility]   from DVT post knee surgery   Rupture of quadriceps tendon 05/10/2017   Wears glasses     Past Surgical History:  Procedure Laterality Date   FRACTURE SURGERY     ankles and fingers   INGUINAL HERNIA REPAIR Left 12/16/2020   Procedure: LAPAROSCOPIC LEFT AND  RIGHT INGUINAL HERNIA REPAIR; TAP BLOCKS;  Surgeon: GMichael Boston MD;  [facility];  Service: General;  Laterality: Left;  GEN AND LOCAL   KNEE SURGERY     right; menicus tear    LAPAROSCOPIC CHOLECYSTECTOMY SINGLE SITE WITH INTRAOPERATIVE CHOLANGIOGRAM N/A 09/04/2020   Procedure: LAPAROSCOPIC CHOLECYSTECTOMY SINGLE SITE WITH INTRAOPERATIVE CHOLANGIOGRAM BILATERAL TAP BLOCK;  Surgeon: GMichael Boston MD;  [facility];  Service: General;  Laterality: N/A;   LYMPHADENECTOMY Bilateral 07/09/2015   Procedure: PELVIC LYMPHADENECTOMY;  Surgeon: LRaynelle Bring MD;  [facility];  Service: Urology;  Laterality: Bilateral;   QUADRICEPS REPAIR     ROBOT ASSISTED LAPAROSCOPIC RADICAL PROSTATECTOMY N/A 07/09/2015    Procedure: XI ROBOTIC ASSISTED LAPAROSCOPIC RADICAL PROSTATECTOMY LEVEL 2;  Surgeon: LRaynelle Bring MD;  [facility];  Service: Urology;  Laterality: N/A;   TONSILLECTOMY     TOTAL KNEE ARTHROPLASTY Right 03/04/2016   Procedure: RIGHT TOTAL KNEE ARTHROPLASTY;  Surgeon: RSydnee Cabal MD;  [facility];  Service: Orthopedics;  Laterality: Right;    Family History  Problem Relation Age of Onset   Heart attack Father    Alcohol abuse Father        Heavy smoker and drinker   Cancer Brother        base of tongue   Social History:  reports that he quit smoking about 43 years ago. His smoking use included cigarettes. He has a 20.00 pack-year smoking history. He has never used smokeless tobacco. He reports current alcohol use of about 21.0 standard drinks of alcohol per week. He reports that he does not use drugs.  Allergies: No Known Allergies  Medications Prior to Admission  Medication Sig Dispense Refill   allopurinol (ZYLOPRIM) 300 MG tablet Take 300 mg by mouth daily.     cholecalciferol (VITAMIN D) 1000 units tablet Take 2,000 Units by mouth daily.     ezetimibe (ZETIA) 10 MG tablet Take 10 mg by mouth daily.     Glucosamine-Chondroitin (GLUCOSAMINE CHONDR COMPLEX PO) Take 2 tablets by mouth daily.  ibuprofen (ADVIL) 200 MG tablet Take 400 mg by mouth every 6 (six) hours as needed for mild pain or headache.     rivaroxaban (XARELTO) 20 MG TABS tablet TAKE 1 TABLET BY MOUTH ONCE DAILY WITH SUPPER (Patient taking differently: Take 20 mg by mouth daily.) 90 tablet 2   rosuvastatin (CRESTOR) 40 MG tablet Take 1 tablet by mouth daily.     zolpidem (AMBIEN) 10 MG tablet Take 5 mg by mouth at bedtime as needed for sleep.     ondansetron (ZOFRAN) 4 MG tablet Take 1 tablet (4 mg total) by mouth every 8 (eight) hours as needed for nausea. 8 tablet 5   sildenafil (REVATIO) 20 MG tablet SMARTSIG:3-5 Tablet(s) By Mouth Daily PRN (Patient not taking: Reported on 05/03/2022)      No  results found for this or any previous visit (from the past 48 hour(s)). No results found.  ROS: otherwise negative  Blood pressure (!) 146/98, pulse (!) 59, temperature 98 F (36.7 C), temperature source Oral, resp. rate 20, height '6\' 2"'$  (1.88 m), weight 97.5 kg, SpO2 98 %.  PHYSICAL EXAM: Overall appearance:  Healthy appearing, in no distress Head:  Normocephalic, atraumatic. Ears: External auditory canals are clear; tympanic membranes are intact and the middle ears are free of any effusion. Nose: External nose is healthy in appearance. Internal nasal exam reveals a large septal perforation.  There is intermittent bleeding and it is a little difficult to tell where it is coming from.  Nasal endoscopy was performed as well.  Bleeding site is not identified. Oral Cavity/pharynx:  There are no mucosal lesions or masses identified. Hypopharynx/Larynx: no signs of any mucosal lesions or masses identified. Vocal cords move normally. Neuro:  No identifiable neurologic deficits. Neck: No palpable neck masses.  Studies Reviewed: none    Assessment/Plan Intractable epistaxis.  Recommend nasal endoscopy under general anesthesia with cauterization and possible packing to control epistaxis.  Izora Gala 05/03/2022, 3:42 PM

## 2022-05-03 NOTE — Op Note (Signed)
OPERATIVE REPORT  DATE OF SURGERY: 05/03/2022  PATIENT:  Zachary Sanchez,  77 y.o. male  PRE-OPERATIVE DIAGNOSIS:  Nose Bleed  POST-OPERATIVE DIAGNOSIS:  Nose bleed  PROCEDURE:  Procedure(s): NASAL ENDOSCOPY WITH EPISTAXIS CONTROL  SURGEON:  Beckie Salts, MD  ASSISTANTS: None  ANESTHESIA:   General   EBL: 40 ml  DRAINS: None  LOCAL MEDICATIONS USED:  None  SPECIMEN:  none  COUNTS:  Correct  PROCEDURE DETAILS: The patient was taken to the operating room and placed on the operating table in the supine position. Following induction of general endotracheal anesthesia, the nose was prepped and draped in standard fashion.  Afrin spray was used preoperatively nasal cavities.  A 0 and 30 degree endoscope was used throughout the case.  Nasal cavities were inspected and evacuated of all fresh blood and clot.  Bleeding site was identified along the posterior septum adjacent to the leading edge of the middle turbinate and also posteriorly along the superior turbinate.  Both areas were cauterized using the suction cautery unit on the setting of 12 coagulation.  Surgicel was packed into the space between the middle turbinate and the septum and also back between the superior turbinate and the septum.  A long Merisel pack was then placed on the right side.  This was inflated with local anesthetic solution, 1% Xylocaine with epinephrine.  The pharynx was suctioned of blood and secretions.  He tolerated this well.  He was awakened extubated and transferred to recovery in stable condition.    PATIENT DISPOSITION:  To PACU, stable

## 2022-05-03 NOTE — Discharge Instructions (Signed)
Use nasal saline spray every couple of hours during the day and both sides.

## 2022-05-03 NOTE — Transfer of Care (Signed)
Immediate Anesthesia Transfer of Care Note  Patient: Zachary Sanchez  Procedure(s) Performed: NASAL ENDOSCOPY WITH EPISTAXIS CONTROL (Nose)  Patient Location: PACU  Anesthesia Type:General  Level of Consciousness: awake, alert , and oriented  Airway & Oxygen Therapy: Patient Spontanous Breathing and Patient connected to face mask oxygen  Post-op Assessment: Report given to RN and Post -op Vital signs reviewed and stable  Post vital signs: Reviewed and stable  Last Vitals:  Vitals Value Taken Time  BP 164/95 05/03/22 2130  Temp    Pulse 68 05/03/22 2132  Resp 24 05/03/22 2132  SpO2 93 % 05/03/22 2132  Vitals shown include unvalidated device data.  Last Pain:  Vitals:   05/03/22 1538  TempSrc:   PainSc: 0-No pain         Complications: No notable events documented.

## 2022-05-03 NOTE — Anesthesia Preprocedure Evaluation (Addendum)
Anesthesia Evaluation  Patient identified by MRN, date of birth, ID band Patient awake    Reviewed: Allergy & Precautions, H&P , NPO status , Patient's Chart, lab work & pertinent test results  Airway Mallampati: II  TM Distance: >3 FB Neck ROM: Full    Dental no notable dental hx. (+) Teeth Intact, Dental Advisory Given   Pulmonary neg pulmonary ROS, former smoker, PE   Pulmonary exam normal breath sounds clear to auscultation       Cardiovascular + DVT  negative cardio ROS + dysrhythmias Atrial Fibrillation  Rhythm:Regular Rate:Normal     Neuro/Psych negative neurological ROS  negative psych ROS   GI/Hepatic negative GI ROS, Neg liver ROS,,,  Endo/Other  negative endocrine ROS    Renal/GU negative Renal ROS  negative genitourinary   Musculoskeletal  (+) Arthritis ,    Abdominal   Peds  Hematology negative hematology ROS (+)   Anesthesia Other Findings   Reproductive/Obstetrics negative OB ROS                             Lab Results  Component Value Date   WBC 5.4 06/15/2021   HGB 14.7 06/15/2021   HCT 42.4 06/15/2021   MCV 99 (H) 06/15/2021   PLT 197 06/15/2021   Lab Results  Component Value Date   CREATININE 0.92 03/01/2022   BUN 13 03/01/2022   NA 139 03/01/2022   K 4.4 03/01/2022   CL 101 03/01/2022   CO2 25 03/01/2022    Anesthesia Physical Anesthesia Plan  ASA: 3  Anesthesia Plan: General   Post-op Pain Management: Ofirmev IV (intra-op)*   Induction: Intravenous, Rapid sequence and Cricoid pressure planned  PONV Risk Score and Plan: 2 and Dexamethasone, Ondansetron and Treatment may vary due to age or medical condition  Airway Management Planned: Oral ETT  Additional Equipment: None  Intra-op Plan:   Post-operative Plan: Extubation in OR  Informed Consent: I have reviewed the patients History and Physical, chart, labs and discussed the procedure  including the risks, benefits and alternatives for the proposed anesthesia with the patient or authorized representative who has indicated his/her understanding and acceptance.     Dental advisory given  Plan Discussed with: Anesthesiologist and CRNA  Anesthesia Plan Comments:         Anesthesia Quick Evaluation

## 2022-05-03 NOTE — Anesthesia Procedure Notes (Signed)
Procedure Name: Intubation Date/Time: 05/03/2022 8:33 PM  Performed by: Alain Marion, CRNAPre-anesthesia Checklist: Patient identified, Emergency Drugs available, Suction available and Patient being monitored Patient Re-evaluated:Patient Re-evaluated prior to induction Oxygen Delivery Method: Circle System Utilized Preoxygenation: Pre-oxygenation with 100% oxygen Induction Type: IV induction and Rapid sequence Laryngoscope Size: Miller and 2 Grade View: Grade III Tube type: Oral Tube size: 7.5 mm Number of attempts: 1 Airway Equipment and Method: Stylet and Oral airway Placement Confirmation: ETT inserted through vocal cords under direct vision, positive ETCO2 and breath sounds checked- equal and bilateral Tube secured with: Tape Dental Injury: Teeth and Oropharynx as per pre-operative assessment

## 2022-05-04 ENCOUNTER — Encounter (HOSPITAL_COMMUNITY): Payer: Self-pay | Admitting: Otolaryngology

## 2022-05-04 NOTE — Anesthesia Postprocedure Evaluation (Signed)
Anesthesia Post Note  Patient: BRYNDEN THUNE  Procedure(s) Performed: NASAL ENDOSCOPY WITH EPISTAXIS CONTROL (Nose)     Patient location during evaluation: PACU Anesthesia Type: General Level of consciousness: awake and alert Pain management: pain level controlled Vital Signs Assessment: post-procedure vital signs reviewed and stable Respiratory status: spontaneous breathing, nonlabored ventilation and respiratory function stable Cardiovascular status: blood pressure returned to baseline and stable Postop Assessment: no apparent nausea or vomiting Anesthetic complications: no  No notable events documented.  Last Vitals:  Vitals:   05/03/22 2215 05/03/22 2230  BP: (!) 151/88 (!) 147/85  Pulse: 65 65  Resp: 20 19  Temp:  36.7 C  SpO2: 97% 95%    Last Pain:  Vitals:   05/03/22 2230  TempSrc:   PainSc: 0-No pain                 Miles Leyda,W. EDMOND

## 2022-05-11 ENCOUNTER — Other Ambulatory Visit: Payer: Self-pay

## 2022-05-11 DIAGNOSIS — R040 Epistaxis: Secondary | ICD-10-CM | POA: Diagnosis not present

## 2022-05-11 DIAGNOSIS — Z96659 Presence of unspecified artificial knee joint: Secondary | ICD-10-CM | POA: Diagnosis not present

## 2022-05-11 DIAGNOSIS — Z7901 Long term (current) use of anticoagulants: Secondary | ICD-10-CM | POA: Diagnosis not present

## 2022-05-12 ENCOUNTER — Encounter (HOSPITAL_BASED_OUTPATIENT_CLINIC_OR_DEPARTMENT_OTHER): Payer: Self-pay

## 2022-05-12 ENCOUNTER — Emergency Department (HOSPITAL_BASED_OUTPATIENT_CLINIC_OR_DEPARTMENT_OTHER)
Admission: EM | Admit: 2022-05-12 | Discharge: 2022-05-12 | Disposition: A | Discharge status: Home / Self Care | Payer: Medicare Other | Attending: Emergency Medicine | Admitting: Emergency Medicine

## 2022-05-12 ENCOUNTER — Telehealth: Payer: Self-pay | Admitting: Cardiology

## 2022-05-12 DIAGNOSIS — Z7901 Long term (current) use of anticoagulants: Secondary | ICD-10-CM | POA: Diagnosis not present

## 2022-05-12 DIAGNOSIS — J3489 Other specified disorders of nose and nasal sinuses: Secondary | ICD-10-CM | POA: Diagnosis not present

## 2022-05-12 DIAGNOSIS — R040 Epistaxis: Secondary | ICD-10-CM | POA: Diagnosis not present

## 2022-05-12 LAB — COMPREHENSIVE METABOLIC PANEL
ALT: 45 U/L — ABNORMAL HIGH (ref 0–44)
AST: 38 U/L (ref 15–41)
Albumin: 4.1 g/dL (ref 3.5–5.0)
Alkaline Phosphatase: 94 U/L (ref 38–126)
Anion gap: 10 (ref 5–15)
BUN: 11 mg/dL (ref 8–23)
CO2: 25 mmol/L (ref 22–32)
Calcium: 10 mg/dL (ref 8.9–10.3)
Chloride: 100 mmol/L (ref 98–111)
Creatinine, Ser: 0.67 mg/dL (ref 0.61–1.24)
GFR, Estimated: >60 mL/min (ref >60)
Glucose, Bld: 99 mg/dL (ref 70–99)
Potassium: 4.2 mmol/L (ref 3.5–5.1)
Sodium: 135 mmol/L (ref 135–145)
Total Bilirubin: 0.6 mg/dL (ref 0.3–1.2)
Total Protein: 7.8 g/dL (ref 6.5–8.1)

## 2022-05-12 LAB — CBC WITH DIFFERENTIAL/PLATELET
Abs Immature Granulocytes: 0.04 10*3/uL (ref 0.00–0.07)
Basophils Absolute: 0 10*3/uL (ref 0.0–0.1)
Basophils Relative: 0 %
Eosinophils Absolute: 0.1 10*3/uL (ref 0.0–0.5)
Eosinophils Relative: 1 %
HCT: 41.7 % (ref 39.0–52.0)
Hemoglobin: 14.8 g/dL (ref 13.0–17.0)
Immature Granulocytes: 0 %
Lymphocytes Relative: 31 %
Lymphs Abs: 2.9 10*3/uL (ref 0.7–4.0)
MCH: 34.7 pg — ABNORMAL HIGH (ref 26.0–34.0)
MCHC: 35.5 g/dL (ref 30.0–36.0)
MCV: 97.7 fL (ref 80.0–100.0)
Monocytes Absolute: 1.9 10*3/uL — ABNORMAL HIGH (ref 0.1–1.0)
Monocytes Relative: 20 %
Neutro Abs: 4.6 10*3/uL (ref 1.7–7.7)
Neutrophils Relative %: 48 %
Platelets: 244 10*3/uL (ref 150–400)
RBC: 4.27 MIL/uL (ref 4.22–5.81)
RDW: 12.9 % (ref 11.5–15.5)
WBC: 9.4 10*3/uL (ref 4.0–10.5)
nRBC: 0 % (ref 0.0–0.2)

## 2022-05-12 MED ORDER — OXYMETAZOLINE HCL 0.05 % NA SOLN
2.0000 | Freq: Once | NASAL | Status: AC
  Administered 2022-05-12: 2 via NASAL
  Filled 2022-05-12: qty 30

## 2022-05-12 NOTE — ED Notes (Signed)
Pt agreeable with d/c plan as discussed by provider- this nurse has verbally reinforced d/c instructions and provided pt with written copy.  Pt acknowledges verbal understanding and denies any addl questions, concerns, needs- pt ambulatory independently at d/c escorted by spouse - no acute changes/distress noted

## 2022-05-12 NOTE — Telephone Encounter (Signed)
Pt c/o medication issue:  1. Name of Medication:  Eliquis 20 MG  2. How are you currently taking this medication (dosage and times per day)?  20 MG once daily  3. Are you having a reaction (difficulty breathing--STAT)?   4. What is your medication issue?   Patient states he has been having severe nose bleeds over the past few weeks. They are becoming excessive and he assumes Eliquis is the cause.

## 2022-05-12 NOTE — Discharge Instructions (Signed)
Administer Afrin spray, 1 spray in each nostril every 4 hours.  If bleeding recurs, tilt your head forward and pinch her nostrils shut for 20 minutes.  Follow-up with ENT tomorrow as scheduled.

## 2022-05-12 NOTE — ED Triage Notes (Signed)
Pt has nasal sx 05/03/22 Packing removed Monday and has been on/off bleeding since  Worsening since 4pm Pt has both nares packed with gauze in triage, bleeding controlled at present time Pt states he feels weak Is on xeralto was stopped for a week and was restarted on Tuesday

## 2022-05-12 NOTE — ED Provider Notes (Signed)
[facility] Provider Note   CSN: [id number] Arrival date & time: 05/11/22  2352     History  Chief Complaint  Patient presents with   Epistaxis    Zachary Sanchez is a 77 y.o. male.  Patient is a 77 year old male with past medical history of atrial fibrillation and pulmonary embolism after knee replacement currently taking Xarelto.  Patient presenting today with complaints of nosebleed.  He experienced nosebleed over the summer and was seen by ENT.  His nose began bleeding again approximately 1 week ago.  He was seen by ENT who stopped his Xarelto and performed fiberoptic evaluation and cauterization in the office and in short stay under general anesthesia.  He was discharged with the packing in place which was removed on Monday.  He began bleeding again this evening.  Wife placed gauze in his nose and brought him here.  He denies any new injury or trauma.  He has since resumed taking his Xarelto.  Bleeding is bilateral, but seems more significant from the right nostril.  The history is provided by the patient.       Home Medications Prior to Admission medications   Medication Sig Start Date End Date Taking? Authorizing Provider  allopurinol (ZYLOPRIM) 300 MG tablet Take 300 mg by mouth daily.    [provider]  cholecalciferol (VITAMIN D) 1000 units tablet Take 2,000 Units by mouth daily.    [provider]  ezetimibe (ZETIA) 10 MG tablet Take 10 mg by mouth daily. 03/19/20   [provider]  Glucosamine-Chondroitin (GLUCOSAMINE CHONDR COMPLEX PO) Take 2 tablets by mouth daily.    [provider]  ibuprofen (ADVIL) 200 MG tablet Take 400 mg by mouth every 6 (six) hours as needed for mild pain or headache.    [provider]  ondansetron (ZOFRAN) 4 MG tablet Take 1 tablet (4 mg total) by mouth every 8 (eight) hours as needed for nausea. 12/16/20   Michael Boston, MD  rivaroxaban (XARELTO) 20 MG TABS  tablet TAKE 1 TABLET BY MOUTH ONCE DAILY WITH SUPPER Patient taking differently: Take 20 mg by mouth daily. 09/30/21   Allred, Jeneen Rinks, MD  rosuvastatin (CRESTOR) 40 MG tablet Take 1 tablet by mouth daily. 05/07/20   [provider]  sildenafil (REVATIO) 20 MG tablet SMARTSIG:3-5 Tablet(s) By Mouth Daily PRN Patient not taking: Reported on 05/03/2022 03/23/21   [provider]  zolpidem (AMBIEN) 10 MG tablet Take 5 mg by mouth at bedtime as needed for sleep. 04/29/20   [provider]      Allergies    Patient has no known allergies.    Review of Systems   Review of Systems  All other systems reviewed and are negative.   Physical Exam Updated Vital Signs BP (!) 143/101 (BP Location: Right Arm)   Pulse 88   Temp 98.6 F (37 C)   Resp 15   Ht '6\' 2"'$  (1.88 m)   Wt 97 kg   SpO2 99%   BMI 27.46 kg/m  Physical Exam Vitals and nursing note reviewed.  Constitutional:      General: He is not in acute distress.    Appearance: Normal appearance. He is not ill-appearing.  HENT:     Head: Normocephalic and atraumatic.     Nose:     Comments: There is dried blood in the nares, but no active bleeding.  Oropharynx is normal in appearance.  Septum is midline. Pulmonary:  Effort: Pulmonary effort is normal.  Skin:    General: Skin is warm and dry.  Neurological:     Mental Status: He is alert.     ED Results / Procedures / Treatments   Labs (all labs ordered are listed, but only abnormal results are displayed) Labs Reviewed  CBC WITH DIFFERENTIAL/PLATELET - Abnormal; Notable for the following components:      Result Value   MCH 34.7 (*)    Monocytes Absolute 1.9 (*)    All other components within normal limits  COMPREHENSIVE METABOLIC PANEL - Abnormal; Notable for the following components:   ALT 45 (*)    All other components within normal limits    EKG None  Radiology No results found.  Procedures Procedures    Medications Ordered in  ED Medications  oxymetazoline (AFRIN) 0.05 % nasal spray 2 spray (has no administration in time range)    ED Course/ Medical Decision Making/ A&P  Patient with history of recent complicated nosebleed requiring intervention in the OR by Dr. Constance Holster presenting with bleeding 2 days after the packing was removed.  Bleeding was controlled with gauze packing at home.  He is having no bleeding here in the ER.    Laboratory studies reveal stable hemoglobin and normal platelet count.  Patient observed here for several hours and has had no further bleeding.  He has an appointment tomorrow with his ENT and I will advise him to keep this appointment.  Patient to limit physical activity and follow-up tomorrow as scheduled.  Final Clinical Impression(s) / ED Diagnoses Final diagnoses:  None    Rx / DC Orders ED Discharge Orders     None         Veryl Speak, MD 05/12/22 [phone]

## 2022-05-12 NOTE — ED Notes (Signed)
Pt awake and alert sitting up in bed -- packing removed by provider Dr. Stark Jock and Afrin subsequently ordered and administered.  Scant amount of blood noted at R nares.  Pt reporting now new onset pressure HA lower FA region; Dr. Stark Jock notified via secure chat- awaiting reply

## 2022-05-12 NOTE — Telephone Encounter (Signed)
I spoke with patient.  He has been having more frequent dose bleeds recently. Has been seen by ENT (notes in [place]) and procedure done due to bleeding.  Was seen in [place] ED last night due to bleeding.  He is not having any bleeding at this time and is taking Xarelto.  Is seeing ENT today.   Patient is asking if Xarelto dose can be decreased or if another medication could be ordered.  I explained to patient that Xarelto dose is based on several factors and lower dose may not be appropriate for him.   Patient reports he was in the donut hole and started ordering his Xarelto from [place] a few months ago.  Assistance program discussed with patient and will send information to him through my chart.  He will let us know if he would like to apply for assistance.  Will forward to Dr Quentin Ore regarding frequent nosebleeds.

## 2022-05-16 NOTE — Telephone Encounter (Signed)
Prescription refill request for Xarelto received.  Indication: Afib  Last office visit: 03/01/22 Quentin Ore)  Weight: 97kg Age: 77 Scr: 0.67 (05/12/22)  CrCl: 128.72m/min  Xarelto '20mg'$  daily is appropriate dose per dosing criteria.

## 2022-05-16 NOTE — Telephone Encounter (Signed)
See MyChart message encounter.

## 2022-05-18 DIAGNOSIS — K080 Exfoliation of teeth due to systemic causes: Secondary | ICD-10-CM | POA: Diagnosis not present

## 2022-06-09 DIAGNOSIS — R040 Epistaxis: Secondary | ICD-10-CM | POA: Diagnosis not present

## 2022-06-09 DIAGNOSIS — J3489 Other specified disorders of nose and nasal sinuses: Secondary | ICD-10-CM | POA: Diagnosis not present

## 2022-06-14 ENCOUNTER — Ambulatory Visit: Payer: Medicare Other | Admitting: Internal Medicine

## 2022-06-16 NOTE — Progress Notes (Signed)
CARDIOLOGY CONSULT NOTE       Patient ID: Zachary Sanchez MRN: [id number] DOB/AGE: 08/22/1945 77 y.o.  Admit date: (Not on file) Referring Physician: Quentin Sanchez Primary Physician: Zachary Late, MD Primary Cardiologist: New Reason for Consultation: Pre Watchman EP Physician :  Zachary Sanchez     HPI:  77 y.o. referred by Dr Zachary Sanchez and Zachary Sanchez for pre Watchman visit .  He has had persistent afib with frequent nose bleeds requiring ENT visits and cauterization. He also has difficulty affording DOAC. Prior  DVT and PE but this was provoked in 2017 after knee surgery He is on statin for HLD [place] VASC is now 86 being 77 years old And high calcium score   Notes from Dr Zachary Sanchez and Zachary Sanchez indicate permanent afib with no plans for cardioversion.    TTE done 03/30/22 showed EF 60-65% Aortic root dilatation 4.0 cm with mild MR and moderate LAE  CTA done 03/28/22 and read by myself showed aortic root 4.1 cm with Chicken wing appendage no thrombus Average Landing Zone diameter 25.9 suitable for a 31 mm FLX device   Note calcium score was 1329 involving the LAD/RCA , 82 nd percentile for age/sex   He has not been on his xarelto for a month due to nose bleeds Told him needs to restart it so the procedure was not cancelled for LAA thrombus at time of TEE. His ENT doctor is Zachary Sanchez He had no other questions about the procedure  Also discussed his high calcium score and need for stress testing He is active at "[employer]" 3-4 days per week and no chest pain. Retired Music therapist. Married x 2.    ROS All other systems reviewed and negative except as noted above  Past Medical History:  Diagnosis Date   Aortic root enlargement (HCC)    aortic root 51m in size   Atrial flutter (HCC)    Biatrial enlargement    Colonic polyp    DJD (degenerative joint disease)    DVT [place]deep venous thrombosis) ([facility] 03[place]   post knee surgery, on eliquis   Dysrhythmia    a fib   Gout    Heart murmur     when younger   History of benign prostatic hypertrophy    Hyperlipidemia    Persistent atrial fibrillation (HCC)    longstanding persistent, asymptomatic   Pneumonia    Prostate cancer ([facility]    Pulmonary emboli ([facility] 03[place]   from DVT post knee surgery   Rupture of quadriceps tendon 05/10/2017   Wears glasses     Family History  Problem Relation Age of Onset   Heart attack Father    Alcohol abuse Father        Heavy smoker and drinker   Cancer Brother        base of tongue    Social History   Socioeconomic History   Marital status: Married    Spouse name: Not on file   Number of children: Not on file   Years of education: Not on file   Highest education level: Not on file  Occupational History   Occupation: FEngineer, maintenance (IT)   Employer: [employer]  Tobacco Use   Smoking status: Former    Packs/day: 1.00    Years: 20.00    Total pack years: 20.00    Types: Cigarettes    Quit date: 02/11/1979    Years since quitting: 43.4   Smokeless tobacco: Never  Vaping Use   Vaping  Use: Never used  Substance and Sexual Activity   Alcohol use: Yes    Alcohol/week: 21.0 standard drinks of alcohol    Types: 21 Glasses of wine per week    Comment: 2-3 glasses of wine daily   Drug use: No   Sexual activity: Not Currently  Other Topics Concern   Not on file  Social History Narrative   Not on file   Social Determinants of Health   Financial Resource Strain: Not on file  Food Insecurity: Not on file  Transportation Needs: Not on file  Physical Activity: Not on file  Stress: Not on file  Social Connections: Not on file  Intimate Partner Violence: Not on file    Past Surgical History:  Procedure Laterality Date   FRACTURE SURGERY     ankles and fingers   INGUINAL HERNIA REPAIR Left 12/16/2020   Procedure: LAPAROSCOPIC LEFT AND  RIGHT INGUINAL HERNIA REPAIR; TAP BLOCKS;  Surgeon: Michael Boston, MD;  Location: [facility];  Service: General;  Laterality: Left;  GEN AND LOCAL    KNEE SURGERY     right; menicus tear    LAPAROSCOPIC CHOLECYSTECTOMY SINGLE SITE WITH INTRAOPERATIVE CHOLANGIOGRAM N/A 09/04/2020   Procedure: LAPAROSCOPIC CHOLECYSTECTOMY SINGLE SITE WITH INTRAOPERATIVE CHOLANGIOGRAM BILATERAL TAP BLOCK;  Surgeon: Michael Boston, MD;  Location: [facility];  Service: General;  Laterality: N/A;   LYMPHADENECTOMY Bilateral 07/09/2015   Procedure: PELVIC LYMPHADENECTOMY;  Surgeon: Raynelle Bring, MD;  Location: [facility];  Service: Urology;  Laterality: Bilateral;   NASAL ENDOSCOPY WITH EPISTAXIS CONTROL N/A 05/03/2022   Procedure: NASAL ENDOSCOPY WITH EPISTAXIS CONTROL;  Surgeon: Izora Gala, MD;  Location: [facility];  Service: ENT;  Laterality: N/A;   QUADRICEPS REPAIR     ROBOT ASSISTED LAPAROSCOPIC RADICAL PROSTATECTOMY N/A 07/09/2015   Procedure: XI ROBOTIC ASSISTED LAPAROSCOPIC RADICAL PROSTATECTOMY LEVEL 2;  Surgeon: Raynelle Bring, MD;  Location: [facility];  Service: Urology;  Laterality: N/A;   TONSILLECTOMY     TOTAL KNEE ARTHROPLASTY Right 03/04/2016   Procedure: RIGHT TOTAL KNEE ARTHROPLASTY;  Surgeon: Sydnee Cabal, MD;  Location: [facility];  Service: Orthopedics;  Laterality: Right;      Current Outpatient Medications:    allopurinol (ZYLOPRIM) 300 MG tablet, Take 300 mg by mouth daily., Disp: , Rfl:    cholecalciferol (VITAMIN D) 1000 units tablet, Take 2,000 Units by mouth daily., Disp: , Rfl:    ezetimibe (ZETIA) 10 MG tablet, Take 10 mg by mouth daily., Disp: , Rfl:    Glucosamine-Chondroitin (GLUCOSAMINE CHONDR COMPLEX PO), Take 2 tablets by mouth daily., Disp: , Rfl:    ibuprofen (ADVIL) 200 MG tablet, Take 400 mg by mouth every 6 (six) hours as needed for mild pain or headache., Disp: , Rfl:    ondansetron (ZOFRAN) 4 MG tablet, Take 1 tablet (4 mg total) by mouth every 8 (eight) hours as needed for nausea., Disp: 8 tablet, Rfl: 5   rosuvastatin (CRESTOR) 40 MG tablet, Take 1 tablet by mouth daily., Disp: , Rfl:    sildenafil (REVATIO) 20 MG tablet, , Disp: ,  Rfl:    zolpidem (AMBIEN) 10 MG tablet, Take 5 mg by mouth at bedtime as needed for sleep., Disp: , Rfl:     Physical Exam: BP 122/68   Pulse 73   Ht '6\' 2"'$  (1.88 m)   Wt 220 lb 12.8 oz (100.2 kg)   SpO2 96%   BMI 28.35 kg/m    Affect appropriate Healthy:  appears stated age HEENT: normal Neck supple with  no adenopathy JVP normal no bruits no thyromegaly Lungs clear with no wheezing and good diaphragmatic motion Heart:  S1/S2 no murmur, no rub, gallop or click PMI normal Abdomen: benighn, BS positve, no tenderness, no AAA no bruit.  No HSM or HJR Distal pulses intact with no bruits No edema Neuro non-focal Skin warm and dry No muscular weakness  Labs:   Lab Results  Component Value Date   WBC 9.4 05/12/2022   HGB 14.8 05/12/2022   HCT 41.7 05/12/2022   MCV 97.7 05/12/2022   PLT 244 05/12/2022     Radiology: No results found.  EKG: afib rate 53 06/15/21    ASSESSMENT AND PLAN:   Permanent afib:  with moderate bi atrial enlargement Difficulty affording DOAC and frequent nose bleeds Seen by Dr Zachary Sanchez and felt to be a candidate for Watchman. CTA suggests chicken wing morphology suitable for a 31 mm FLX device He will resume his xarelto today after being off it for a month His procedure is not scheduled until 07/21/22 so should be ok  [facility] Referral for Left Atrial Appendage Closure with Non-Valvular Atrial Fibrillation   Zachary Sanchez is a 77 y.o. male is being referred to the [facility] for evaluation for Left Atrial Appendage Closure with Watchman device for the management of stroke risk resulting form non-valvular atrial fibrillation.    Base upon Mr. Bourret history, he is felt to be a poor candidate for long-term anticoagulation because of frequent nose bleeds and difficulty affording DOAC The patient has a HAS-BLED score of 33 indicating a Yearly Major Bleeding Risk of 3.743.6%   His CHADS2-VASc Score is 3 with an unadjusted  Ischemic Stroke Rate (% per year) of 3.2%.  {  His stroke risk necessitates a strategy of stroke prevention with either long-term oral anticoagulation or left atrial appendage occlusion therapy. We have discussed their bleeding risk in the context of their comorbid medical problems, as well as the rationale for referral for evaluation of Watchman left atrial appendage occlusion therapy. While the patient is at high long-term bleeding risk, they may be appropriate for short-term anticoagulation. Based on this individual patient's stroke and bleeding risk, a shared decision has been made to refer the patient for consideration of Watchman left atrial appendage closure utilizing the [employer] of Cardiology shared decision tool.   2. CAD:  high calcium score for age 75 LAD/RCA , 59 nd percentile should further risk stratify with perfusion study Lexiscan myovue  3. HLDL  continue statin update labs with primary Target LDL < 70  4. Dilated aortic root:  4.2 cm suggest yearly imaging with CTA or MRI Has been stable most recent CTA 03/28/22    Lexiscan Myovue Pre Watchman Education / Labs per nurse navigator Resume Xarelto today   F/U with Dr Zachary Sanchez post procedure  Signed: Jenkins Rouge 06/29/2022, 2:53 PM

## 2022-06-17 ENCOUNTER — Telehealth: Payer: Self-pay

## 2022-06-17 DIAGNOSIS — R931 Abnormal findings on diagnostic imaging of heart and coronary circulation: Secondary | ICD-10-CM

## 2022-06-17 NOTE — Telephone Encounter (Signed)
Josue Hector, MD  Michaelyn Barter, RN; Theodoro Parma, RN OK- he will need a PET/CT at some point as his calcium score was very high on Watchman CTA can see if we can do this before visit  Left message for patient to call back. Placed order for patient. Will discuss when she calls back.

## 2022-06-22 NOTE — Telephone Encounter (Signed)
Dr. Johnsie Cancel will need to see patient per insurance and [facility] registry. Will cancel order for PET/CT per Dr. Johnsie Cancel.

## 2022-06-22 NOTE — Addendum Note (Signed)
Addended by: Aris Georgia, Jakhai Fant L on: 06/22/2022 11:57 AM   Modules accepted: Orders

## 2022-06-27 ENCOUNTER — Ambulatory Visit: Payer: Medicare Other | Admitting: Internal Medicine

## 2022-06-29 ENCOUNTER — Encounter: Payer: Self-pay | Admitting: Cardiovascular Disease

## 2022-06-29 ENCOUNTER — Ambulatory Visit: Payer: Medicare Other | Attending: Cardiovascular Disease | Admitting: Cardiovascular Disease

## 2022-06-29 VITALS — BP 122/68 | HR 73 | Ht 74.0 in | Wt 220.8 lb

## 2022-06-29 DIAGNOSIS — I4821 Permanent atrial fibrillation: Secondary | ICD-10-CM

## 2022-06-29 DIAGNOSIS — Z01818 Encounter for other preprocedural examination: Secondary | ICD-10-CM

## 2022-06-29 DIAGNOSIS — Z7901 Long term (current) use of anticoagulants: Secondary | ICD-10-CM

## 2022-06-29 DIAGNOSIS — R931 Abnormal findings on diagnostic imaging of heart and coronary circulation: Secondary | ICD-10-CM

## 2022-06-29 DIAGNOSIS — I77819 Aortic ectasia, unspecified site: Secondary | ICD-10-CM

## 2022-06-29 DIAGNOSIS — Z5181 Encounter for therapeutic drug level monitoring: Secondary | ICD-10-CM | POA: Diagnosis not present

## 2022-06-29 NOTE — Addendum Note (Signed)
Addended by: Aris Georgia, Kyrin Gratz L on: 06/29/2022 03:49 PM   Modules accepted: Orders

## 2022-06-29 NOTE — Patient Instructions (Addendum)
Medication Instructions:  Your physician recommends that you continue on your current medications as directed. Please refer to the Current Medication list given to you today.  *If you need a refill on your cardiac medications before your next appointment, please call your pharmacy*  Lab Work: Your physician recommends that you have lab work today- BMET and CBC  If you have labs (blood work) drawn today and your tests are completely normal, you will receive your results only by: MyChart Message (if you have MyChart) OR A paper copy in the mail If you have any lab test that is abnormal or we need to change your treatment, we will call you to review the results.  Testing/Procedures: Your physician has requested that you have a lexiscan myoview in May. For further information please visit [email or web address]. Please follow instruction sheet, as given.  Follow-Up: At [facility], you and your health needs are our priority.  As part of our continuing mission to provide you with exceptional heart care, we have created designated Provider Care Teams.  These Care Teams include your primary Cardiologist (physician) and Advanced Practice Providers (APPs -  Physician Assistants and Nurse Practitioners) who all work together to provide you with the care you need, when you need it.  We recommend signing up for the patient portal called "MyChart".  Sign up information is provided on this After Visit Summary.  MyChart is used to connect with patients for Virtual Visits (Telemedicine).  Patients are able to view lab/test results, encounter notes, upcoming appointments, etc.  Non-urgent messages can be sent to your provider as well.   To learn more about what you can do with MyChart, go to [email or web address].    Your next appointment:   Structural Heart Team  Provider:   You may see Vickie Epley, MD or one of the following Advanced Practice Providers on your designated Care Team:   Tommye Standard, [place] Beryle Beams" [place], [place], NP

## 2022-06-30 DIAGNOSIS — Z7901 Long term (current) use of anticoagulants: Secondary | ICD-10-CM | POA: Diagnosis not present

## 2022-06-30 DIAGNOSIS — J3489 Other specified disorders of nose and nasal sinuses: Secondary | ICD-10-CM | POA: Diagnosis not present

## 2022-06-30 DIAGNOSIS — R040 Epistaxis: Secondary | ICD-10-CM | POA: Diagnosis not present

## 2022-06-30 LAB — CBC WITH DIFFERENTIAL/PLATELET
Basophils Absolute: 0 10*3/uL (ref 0.0–0.2)
Basos: 0 %
EOS (ABSOLUTE): 0 10*3/uL (ref 0.0–0.4)
Eos: 0 %
Hematocrit: 41.7 % (ref 37.5–51.0)
Hemoglobin: 13.9 g/dL (ref 13.0–17.7)
Immature Grans (Abs): 0 10*3/uL (ref 0.0–0.1)
Immature Granulocytes: 0 %
Lymphocytes Absolute: 2 10*3/uL (ref 0.7–3.1)
Lymphs: 25 %
MCH: 34.7 pg — ABNORMAL HIGH (ref 26.6–33.0)
MCHC: 33.3 g/dL (ref 31.5–35.7)
MCV: 104 fL — ABNORMAL HIGH (ref 79–97)
Monocytes Absolute: 0.6 10*3/uL (ref 0.1–0.9)
Monocytes: 7 %
Neutrophils Absolute: 5.4 10*3/uL (ref 1.4–7.0)
Neutrophils: 68 %
Platelets: 215 10*3/uL (ref 150–450)
RBC: 4.01 x10E6/uL — ABNORMAL LOW (ref 4.14–5.80)
RDW: 12.6 % (ref 11.6–15.4)
WBC: 8 10*3/uL (ref 3.4–10.8)

## 2022-06-30 LAB — BASIC METABOLIC PANEL
BUN/Creatinine Ratio: 14 (ref 10–24)
BUN: 12 mg/dL (ref 8–27)
CO2: 22 mmol/L (ref 20–29)
Calcium: 10.2 mg/dL (ref 8.6–10.2)
Chloride: 102 mmol/L (ref 96–106)
Creatinine, Ser: 0.88 mg/dL (ref 0.76–1.27)
Glucose: 90 mg/dL (ref 70–99)
Potassium: 4.3 mmol/L (ref 3.5–5.2)
Sodium: 140 mmol/L (ref 134–144)
eGFR: 89 mL/min/{1.73_m2} (ref >59)

## 2022-07-06 DIAGNOSIS — H2513 Age-related nuclear cataract, bilateral: Secondary | ICD-10-CM | POA: Diagnosis not present

## 2022-07-07 DIAGNOSIS — I4821 Permanent atrial fibrillation: Secondary | ICD-10-CM

## 2022-07-12 DIAGNOSIS — D500 Iron deficiency anemia secondary to blood loss (chronic): Secondary | ICD-10-CM | POA: Diagnosis not present

## 2022-07-18 ENCOUNTER — Telehealth: Payer: Self-pay

## 2022-07-18 NOTE — Telephone Encounter (Signed)
Confirmed procedure date of 07/21/2022. Confirmed arrival time of 0530 for procedure time at 0730. Confirmed the patient restarted Xarelto 20 mg as directed by Dr. Johnsie Cancel at pre-procedure visit. Medication list updated. Reviewed pre-procedure instructions with patient. The patient understands to call if questions/concerns arise prior to procedure.

## 2022-07-21 ENCOUNTER — Other Ambulatory Visit: Payer: Self-pay

## 2022-07-21 ENCOUNTER — Inpatient Hospital Stay (HOSPITAL_COMMUNITY): Payer: Medicare Other

## 2022-07-21 ENCOUNTER — Inpatient Hospital Stay (HOSPITAL_COMMUNITY)
Admission: RE | Disposition: A | Discharge status: Home / Self Care | Payer: Self-pay | Source: Home / Self Care | Attending: Cardiology

## 2022-07-21 ENCOUNTER — Inpatient Hospital Stay (HOSPITAL_COMMUNITY): Payer: Medicare Other | Admitting: Anesthesiology

## 2022-07-21 ENCOUNTER — Encounter (HOSPITAL_COMMUNITY): Payer: Self-pay | Admitting: Cardiology

## 2022-07-21 ENCOUNTER — Inpatient Hospital Stay (HOSPITAL_COMMUNITY)
Admission: RE | Admit: 2022-07-21 | Discharge: 2022-07-21 | DRG: 274 | Disposition: A | Discharge status: Home / Self Care | Payer: Medicare Other | Attending: Cardiology | Admitting: Cardiology

## 2022-07-21 DIAGNOSIS — Z7901 Long term (current) use of anticoagulants: Secondary | ICD-10-CM | POA: Diagnosis not present

## 2022-07-21 DIAGNOSIS — Z96651 Presence of right artificial knee joint: Secondary | ICD-10-CM | POA: Diagnosis present

## 2022-07-21 DIAGNOSIS — Z8249 Family history of ischemic heart disease and other diseases of the circulatory system: Secondary | ICD-10-CM | POA: Diagnosis not present

## 2022-07-21 DIAGNOSIS — Z86010 Personal history of colonic polyps: Secondary | ICD-10-CM

## 2022-07-21 DIAGNOSIS — Z87891 Personal history of nicotine dependence: Secondary | ICD-10-CM

## 2022-07-21 DIAGNOSIS — E785 Hyperlipidemia, unspecified: Secondary | ICD-10-CM | POA: Diagnosis present

## 2022-07-21 DIAGNOSIS — I081 Rheumatic disorders of both mitral and tricuspid valves: Secondary | ICD-10-CM | POA: Diagnosis present

## 2022-07-21 DIAGNOSIS — Z811 Family history of alcohol abuse and dependence: Secondary | ICD-10-CM

## 2022-07-21 DIAGNOSIS — R040 Epistaxis: Secondary | ICD-10-CM

## 2022-07-21 DIAGNOSIS — I739 Peripheral vascular disease, unspecified: Secondary | ICD-10-CM

## 2022-07-21 DIAGNOSIS — M1990 Unspecified osteoarthritis, unspecified site: Secondary | ICD-10-CM | POA: Diagnosis present

## 2022-07-21 DIAGNOSIS — I4821 Permanent atrial fibrillation: Secondary | ICD-10-CM

## 2022-07-21 DIAGNOSIS — Z8546 Personal history of malignant neoplasm of prostate: Secondary | ICD-10-CM | POA: Diagnosis not present

## 2022-07-21 DIAGNOSIS — I4891 Unspecified atrial fibrillation: Secondary | ICD-10-CM | POA: Diagnosis present

## 2022-07-21 DIAGNOSIS — Z86718 Personal history of other venous thrombosis and embolism: Secondary | ICD-10-CM

## 2022-07-21 DIAGNOSIS — N400 Enlarged prostate without lower urinary tract symptoms: Secondary | ICD-10-CM | POA: Diagnosis present

## 2022-07-21 DIAGNOSIS — Z9049 Acquired absence of other specified parts of digestive tract: Secondary | ICD-10-CM | POA: Diagnosis not present

## 2022-07-21 DIAGNOSIS — Z006 Encounter for examination for normal comparison and control in clinical research program: Secondary | ICD-10-CM

## 2022-07-21 DIAGNOSIS — Z95818 Presence of other cardiac implants and grafts: Secondary | ICD-10-CM | POA: Insufficient documentation

## 2022-07-21 DIAGNOSIS — Z86711 Personal history of pulmonary embolism: Secondary | ICD-10-CM | POA: Diagnosis not present

## 2022-07-21 DIAGNOSIS — Z01818 Encounter for other preprocedural examination: Secondary | ICD-10-CM | POA: Diagnosis not present

## 2022-07-21 HISTORY — PX: LEFT ATRIAL APPENDAGE OCCLUSION: EP1229

## 2022-07-21 HISTORY — DX: Presence of other cardiac implants and grafts: Z95.818

## 2022-07-21 HISTORY — PX: TEE WITHOUT CARDIOVERSION: SHX5443

## 2022-07-21 LAB — SURGICAL PCR SCREEN
MRSA, PCR: NEGATIVE
Staphylococcus aureus: NEGATIVE

## 2022-07-21 LAB — TYPE AND SCREEN
ABO/RH(D): B POS
Antibody Screen: NEGATIVE

## 2022-07-21 LAB — ECHO TEE

## 2022-07-21 LAB — POCT ACTIVATED CLOTTING TIME: Activated Clotting Time: 379 seconds

## 2022-07-21 SURGERY — LEFT ATRIAL APPENDAGE OCCLUSION
Anesthesia: General

## 2022-07-21 MED ORDER — IOHEXOL 350 MG/ML SOLN
INTRAVENOUS | Status: DC | PRN
  Administered 2022-07-21 (×3): 5 mL

## 2022-07-21 MED ORDER — HEPARIN (PORCINE) IN NACL 2000-0.9 UNIT/L-% IV SOLN
INTRAVENOUS | Status: DC | PRN
  Administered 2022-07-21: 1000 mL

## 2022-07-21 MED ORDER — SODIUM CHLORIDE 0.9 % IV SOLN: INTRAVENOUS | Status: DC

## 2022-07-21 MED ORDER — LACTATED RINGERS IV SOLN: INTRAVENOUS | Status: DC

## 2022-07-21 MED ORDER — ROCURONIUM BROMIDE 10 MG/ML (PF) SYRINGE
PREFILLED_SYRINGE | INTRAVENOUS | Status: DC | PRN
  Administered 2022-07-21: 60 mg via INTRAVENOUS
  Administered 2022-07-21: 40 mg via INTRAVENOUS

## 2022-07-21 MED ORDER — DEXAMETHASONE SODIUM PHOSPHATE 10 MG/ML IJ SOLN
INTRAMUSCULAR | Status: DC | PRN
  Administered 2022-07-21: 10 mg via INTRAVENOUS

## 2022-07-21 MED ORDER — ONDANSETRON HCL 4 MG/2ML IJ SOLN: 4.0000 mg | Freq: Four times a day (QID) | INTRAMUSCULAR | Status: DC | PRN

## 2022-07-21 MED ORDER — SUGAMMADEX SODIUM 200 MG/2ML IV SOLN
INTRAVENOUS | Status: DC | PRN
  Administered 2022-07-21: 195 mg via INTRAVENOUS

## 2022-07-21 MED ORDER — FENTANYL CITRATE (PF) 250 MCG/5ML IJ SOLN
INTRAMUSCULAR | Status: DC | PRN
  Administered 2022-07-21: 100 ug via INTRAVENOUS

## 2022-07-21 MED ORDER — CHLORHEXIDINE GLUCONATE 0.12 % MT SOLN
15.0000 mL | Freq: Once | OROMUCOSAL | Status: AC
  Administered 2022-07-21: 15 mL via OROMUCOSAL
  Filled 2022-07-21: qty 15

## 2022-07-21 MED ORDER — CHLORHEXIDINE GLUCONATE 4 % EX LIQD: Freq: Once | CUTANEOUS | Status: DC

## 2022-07-21 MED ORDER — PROTAMINE SULFATE 10 MG/ML IV SOLN
INTRAVENOUS | Status: DC | PRN
  Administered 2022-07-21: 30 mg via INTRAVENOUS

## 2022-07-21 MED ORDER — SODIUM CHLORIDE 0.9 % IV SOLN: 250.0000 mL | INTRAVENOUS | Status: DC | PRN

## 2022-07-21 MED ORDER — SODIUM CHLORIDE 0.9% FLUSH: 3.0000 mL | INTRAVENOUS | Status: DC | PRN

## 2022-07-21 MED ORDER — ACETAMINOPHEN 325 MG PO TABS: 650.0000 mg | ORAL_TABLET | ORAL | Status: DC | PRN

## 2022-07-21 MED ORDER — ONDANSETRON HCL 4 MG/2ML IJ SOLN
INTRAMUSCULAR | Status: DC | PRN
  Administered 2022-07-21: 4 mg via INTRAVENOUS

## 2022-07-21 MED ORDER — FENTANYL CITRATE (PF) 100 MCG/2ML IJ SOLN
INTRAMUSCULAR | Status: AC
  Filled 2022-07-21: qty 2

## 2022-07-21 MED ORDER — HEPARIN SODIUM (PORCINE) 1000 UNIT/ML IJ SOLN
INTRAMUSCULAR | Status: DC | PRN
  Administered 2022-07-21: 15000 [IU] via INTRAVENOUS

## 2022-07-21 MED ORDER — PROPOFOL 500 MG/50ML IV EMUL
INTRAVENOUS | Status: DC | PRN
  Administered 2022-07-21: 50 ug/kg/min via INTRAVENOUS

## 2022-07-21 MED ORDER — CEFAZOLIN SODIUM-DEXTROSE 2-4 GM/100ML-% IV SOLN
2.0000 g | INTRAVENOUS | Status: AC
  Administered 2022-07-21: 2 g via INTRAVENOUS
  Filled 2022-07-21: qty 100

## 2022-07-21 MED ORDER — SODIUM CHLORIDE 0.9% FLUSH
3.0000 mL | Freq: Two times a day (BID) | INTRAVENOUS | Status: DC
  Administered 2022-07-21: 3 mL via INTRAVENOUS

## 2022-07-21 MED ORDER — ACETAMINOPHEN 500 MG PO TABS
1000.0000 mg | ORAL_TABLET | Freq: Once | ORAL | Status: AC
  Administered 2022-07-21: 1000 mg via ORAL
  Filled 2022-07-21: qty 2

## 2022-07-21 MED ORDER — PHENYLEPHRINE HCL-NACL 20-0.9 MG/250ML-% IV SOLN
INTRAVENOUS | Status: DC | PRN
  Administered 2022-07-21: 20 ug/min via INTRAVENOUS

## 2022-07-21 MED ORDER — LIDOCAINE 2% (20 MG/ML) 5 ML SYRINGE
INTRAMUSCULAR | Status: DC | PRN
  Administered 2022-07-21: 60 mg via INTRAVENOUS

## 2022-07-21 MED ORDER — PROPOFOL 10 MG/ML IV BOLUS
INTRAVENOUS | Status: DC | PRN
  Administered 2022-07-21: 170 mg via INTRAVENOUS

## 2022-07-21 SURGICAL SUPPLY — 22 items
BLANKET WARM UNDERBOD FULL ACC (MISCELLANEOUS) ×1 IMPLANT
CATH INFINITI 5FR ANG PIGTAIL (CATHETERS) IMPLANT
CLOSURE PERCLOSE PROSTYLE (VASCULAR PRODUCTS) IMPLANT
DEVICE WATCHMAN FLX PROC (KITS) IMPLANT
DILATOR VESSEL 38 20CM 14FR (INTRODUCER) IMPLANT
GUIDEWIRE INQWIRE 1.5J.035X260 (WIRE) ×1 IMPLANT
INQWIRE 1.5J .035X260CM (WIRE) ×1
KIT HEART LEFT (KITS) ×1 IMPLANT
KIT SHEA VERSACROSS LAAC CONNE (KITS) IMPLANT
PACK CARDIAC CATHETERIZATION (CUSTOM PROCEDURE TRAY) ×1 IMPLANT
PAD DEFIB RADIO PHYSIO CONN (PAD) ×1 IMPLANT
SHEATH PERFORMER 16FR 30 (SHEATH) IMPLANT
SHEATH PINNACLE 8F 10CM (SHEATH) IMPLANT
SHEATH PROBE COVER 6X72 (BAG) ×1 IMPLANT
SHIELD RADPAD SCOOP 12X17 (MISCELLANEOUS) ×1 IMPLANT
SYS WATCHMAN FXD DBL (SHEATH) ×1
SYSTEM WATCHMAN FXD DBL (SHEATH) IMPLANT
TRANSDUCER W/STOPCOCK (MISCELLANEOUS) ×1 IMPLANT
TUBING CIL FLEX 10 FLL-RA (TUBING) ×1 IMPLANT
WATCHMAN FLX 35 (Prosthesis & Implant Heart) IMPLANT
WATCHMAN FLX PROCEDURE DEVICE (KITS) ×1 IMPLANT
WATCHMAN PROCED TRUSEAL ACCESS (SHEATH) IMPLANT

## 2022-07-21 NOTE — H&P (Signed)
Electrophysiology Office Follow up Visit Note:     Date:  07/21/2022    ID:  Zachary Sanchez, DOB 01-24-1946, MRN [id number]   PCP:  Zachary Late, MD    [facility] Cardiologist:  None  [facility] Electrophysiologist:  Zachary Epley, MD      Interval History:     Zachary Sanchez is a 77 y.o. male who presents for a follow up visit. Previously followed by Dr. Rayann Sanchez. They were last seen in clinic 06/15/2021 by Zachary Ellison, PA-C. At that visit his EKG showed permanent atrial fibrillation at 53 bpm. His Afib was rate controlled and he was asymptomatic. He was continued on Xarelto for prior DVT/PE.   He called the office 11/15/2021 and reported having several episodes of epistaxis. He had stopped Xarelto for 2 days until his bleeding resolved. He was advised to try a saline nasal spray for dryness.     Today, he confirms that his epistaxis episodes lasted intermittently for several weeks. He has seen ENT and underwent cauterization which seems to have resolved his symptoms. No further epistaxis or other bleeding issues since then.   He denies any palpitations, chest pain, shortness of breath, or peripheral edema. No lightheadedness, headaches, syncope, orthopnea, or PND.  Presents for LAAO today. Procedure reviewed. Wife at bedside.       Objective      Past Medical History:  Diagnosis Date   Aortic root enlargement (HCC)      aortic root 55mm in size   Atrial flutter (HCC)     Biatrial enlargement     Colonic polyp     DJD (degenerative joint disease)     DVT (deep venous thrombosis) ([place]) 03/1016    post knee surgery, on eliquis   Dysrhythmia      a fib   Gout     Heart murmur      when younger   History of benign prostatic hypertrophy     Hyperlipidemia     Persistent atrial fibrillation (HCC)      longstanding persistent, asymptomatic   Pneumonia     Prostate cancer ([place])     Pulmonary emboli ([place]) 03/2016    from DVT post knee surgery   Rupture of  quadriceps tendon 05/10/2017   Wears glasses             Past Surgical History:  Procedure Laterality Date   FRACTURE SURGERY        ankles and fingers   INGUINAL HERNIA REPAIR Left 12/16/2020    Procedure: LAPAROSCOPIC LEFT AND  RIGHT INGUINAL HERNIA REPAIR; TAP BLOCKS;  Surgeon: Zachary Boston, MD;  Location: [facility];  Service: General;  Laterality: Left;  GEN AND LOCAL   KNEE SURGERY        right; menicus tear    LAPAROSCOPIC CHOLECYSTECTOMY SINGLE SITE WITH INTRAOPERATIVE CHOLANGIOGRAM N/A 09/04/2020    Procedure: LAPAROSCOPIC CHOLECYSTECTOMY SINGLE SITE WITH INTRAOPERATIVE CHOLANGIOGRAM BILATERAL TAP BLOCK;  Surgeon: Zachary Boston, MD;  Location: [facility];  Service: General;  Laterality: N/A;   LYMPHADENECTOMY Bilateral 07/09/2015    Procedure: PELVIC LYMPHADENECTOMY;  Surgeon: Zachary Bring, MD;  Location: [facility];  Service: Urology;  Laterality: Bilateral;   QUADRICEPS REPAIR       ROBOT ASSISTED LAPAROSCOPIC RADICAL PROSTATECTOMY N/A 07/09/2015    Procedure: XI ROBOTIC ASSISTED LAPAROSCOPIC RADICAL PROSTATECTOMY LEVEL 2;  Surgeon: Zachary Bring, MD;  Location: [facility];  Service: Urology;  Laterality: N/A;   TONSILLECTOMY  TOTAL KNEE ARTHROPLASTY Right 03/04/2016    Procedure: RIGHT TOTAL KNEE ARTHROPLASTY;  Surgeon: Zachary Cabal, MD;  Location: [facility];  Service: Orthopedics;  Laterality: Right;      Current Medications: Active Medications  No outpatient medications have been marked as taking for the 03/01/22 encounter (Office Visit) with Zachary Epley, MD.        Allergies:   Patient has no known allergies.    Social History         Socioeconomic History   Marital status: Married      Spouse name: Not on file   Number of children: Not on file   Years of education: Not on file   Highest education level: Not on file  Occupational History   Occupation: Engineer, maintenance (IT)      Employer: [employer]  Tobacco Use   Smoking status: Former      Packs/day: 1.00       Years: 20.00      Total pack years: 20.00      Types: Cigarettes      Quit date: 02/11/1979      Years since quitting: 43.0   Smokeless tobacco: Never  Vaping Use   Vaping Use: Never used  Substance and Sexual Activity   Alcohol use: Yes      Alcohol/week: 21.0 standard drinks of alcohol      Types: 21 Glasses of wine per week      Comment: 2-3 glasses of wine daily   Drug use: No   Sexual activity: Not Currently  Other Topics Concern   Not on file  Social History Narrative   Not on file    Social Determinants of Health    Financial Resource Strain: Not on file  Food Insecurity: Not on file  Transportation Needs: Not on file  Physical Activity: Not on file  Stress: Not on file  Social Connections: Not on file      Family History: The patient's family history includes Alcohol abuse in his father; Cancer in his brother; Heart attack in his father.   ROS:   Please see the history of present illness.      All other systems reviewed and are negative.   EKGs/Labs/Other Studies Reviewed:     The following studies were reviewed today:   Echo  08/09/2016: Study Conclusions   - Left ventricle: The cavity size was normal. Wall thickness was    normal. Systolic function was normal. The estimated ejection    fraction was in the range of 55% to 60%. Wall motion was normal;    there were no regional wall motion abnormalities.  - Mitral valve: There was mild regurgitation.  - Left atrium: The atrium was moderately to severely dilated.      Recent Labs: 06/15/2021: BUN 12; Creatinine, Ser 0.90; Hemoglobin 14.7; Platelets 197; Potassium 4.6; Sodium 139    Recent Lipid Panel Labs (Brief)  No results found for: "CHOL", "TRIG", "HDL", "CHOLHDL", "VLDL", "LDLCALC", "LDLDIRECT"     Physical Exam:     VS:  BP 140/96   Pulse 50   Ht 6\' 2"  (1.88 m)   Wt 222 lb (100.7 kg)   SpO2 97%   BMI 28.50 kg/m         Wt Readings from Last 3 Encounters:  03/01/22 222 lb (100.7 kg)   06/15/21 220 lb (99.8 kg)  12/14/20 210 lb (95.3 kg)      GEN: Well nourished, well developed in no acute  distress.  Appears younger than stated age 45: Normal NECK: No JVD; No carotid bruits LYMPHATICS: No lymphadenopathy CARDIAC: Irregularly irregular, no murmurs, rubs, gallops RESPIRATORY:  Clear to auscultation without rales, wheezing or rhonchi  ABDOMEN: Soft, non-tender, non-distended MUSCULOSKELETAL:  No edema; No deformity  SKIN: Warm and dry NEUROLOGIC:  Alert and oriented x 3 PSYCHIATRIC:  Normal affect          Assessment ASSESSMENT:     1. Permanent atrial fibrillation ([place])   2. History of DVT (deep vein thrombosis)   3. Epistaxis     PLAN:     In order of problems listed above:   #Permanent atrial fibrillation Currently on Xarelto but would like to avoid long-term exposure to anticoagulation given her recent severe nosebleed requiring cauterization.  We discussed the procedure in detail and he would like to proceed.   ----------------   I have seen Lavenia Atlas in the office today who is being considered for a Watchman left atrial appendage closure device. I believe they will benefit from this procedure given their history of atrial fibrillation, CHA2DS2-VASc score of 3 and unadjusted ischemic stroke rate of 3.2% per year. Unfortunately, the patient is not felt to be a long term anticoagulation candidate secondary to severe nose bleeding. The patient's chart has been reviewed and I feel that they would be a candidate for short term oral anticoagulation after Watchman implant.    It is my belief that after undergoing a LAA closure procedure, DEMERICK BUCHHOLTZ will not need long term anticoagulation which eliminates anticoagulation side effects and major bleeding risk.    Procedural risks for the Watchman implant have been reviewed with the patient including a 0.5% risk of stroke, <1% risk of perforation and <1% risk of device embolization. Other risks include  bleeding, vascular damage, tamponade, worsening renal function, and death. The patient understands these risk and wishes to proceed.       The published clinical data on the safety and effectiveness of WATCHMAN include but are not limited to the following: - Holmes DR, Mechele Claude, Sick P et al. for the PROTECT AF Investigators. Percutaneous closure of the left atrial appendage versus warfarin therapy for prevention of stroke in patients with atrial fibrillation: a randomised non-inferiority trial. Lancet 2009; 374: 534-42. Mechele Claude, Doshi SK, Abelardo Diesel D et al. on behalf of the PROTECT AF Investigators. Percutaneous Left Atrial Appendage Closure for Stroke Prophylaxis in Patients With Atrial Fibrillation 2.3-Year Follow-up of the PROTECT AF (Watchman Left Atrial Appendage System for Embolic Protection in Patients With Atrial Fibrillation) Trial. Circulation 2013; 127:720-729. - Alli O, Doshi S,  Kar S, Reddy VY, Sievert H et al. Quality of Life Assessment in the Randomized PROTECT AF (Percutaneous Closure of the Left Atrial Appendage Versus Warfarin Therapy for Prevention of Stroke in Patients With Atrial Fibrillation) Trial of Patients at Risk for Stroke With Nonvalvular Atrial Fibrillation. J Am Coll Cardiol 2013; P4788364. Vertell Limber DR, Tarri Abernethy, Price M, [place], Sievert H, Doshi S, Huber K, Reddy V. Prospective randomized evaluation of the Watchman left atrial appendage Device in patients with atrial fibrillation versus long-term warfarin therapy; the PREVAIL trial. Journal of the [employer] of Cardiology, Vol. 4, No. 1, 2014, 1-11. - Kar S, Doshi SK, Sadhu A, Horton R, Osorio J et al. Primary outcome evaluation of a next-generation left atrial appendage closure device: results from the PINNACLE FLX trial. Circulation 2021;143(18)1754-1762.      After today's visit with the  patient which was dedicated solely for shared decision making visit regarding LAA closure device, the patient  decided to proceed with the LAA appendage closure procedure scheduled to be done in the near future at [facility]. Prior to the procedure, I would like to obtain a gated CT scan of the chest with contrast timed for PV/LA visualization.    HAS-BLED score 2 Hypertension No  Abnormal renal and liver function (Dialysis, transplant, Cr >2.26 mg/dL /Cirrhosis or Bilirubin >2x Normal or AST/ALT/AP >3x Normal) No  Stroke No  Bleeding Yes  Labile INR (Unstable/high INR) No  Elderly (>65) Yes  Drugs or alcohol (? 8 drinks/week, anti-plt or NSAID) No    CHA2DS2-VASc Score = 3  The patient's score is based upon: CHF History: 0 HTN History: 0 Diabetes History: 0 Stroke History: 0 Vascular Disease History: 1 Age Score: 2 Gender Score: 0   Presents for LAAO. Procedure reviewed. Patient has taken anticoagulation consistently for > 1 month.         Signed, Lars Mage, MD, [facility], [facility] 07/21/2022 Electrophysiology [facility]

## 2022-07-21 NOTE — Progress Notes (Addendum)
  [place] TEAM  Patient doing well s/p LAAO. He is hemodynamically stable. Groin site is stable. Plan for early ambulation after bedrest later today.    Kathyrn Drown NP-C Structural Heart Team  Pager: [phone] Phone: [phone]

## 2022-07-21 NOTE — Progress Notes (Signed)
2 Iv's removed. Discharge instructions given to patient, all questions answered. Patient discharged to [facility] via wheelchair to private vehicle.

## 2022-07-21 NOTE — Anesthesia Procedure Notes (Signed)
Procedure Name: Intubation Date/Time: 07/21/2022 7:50 AM  Performed by: Minerva Ends, CRNAPre-anesthesia Checklist: Patient identified, Emergency Drugs available, Suction available and Patient being monitored Patient Re-evaluated:Patient Re-evaluated prior to induction Oxygen Delivery Method: Circle system utilized Preoxygenation: Pre-oxygenation with 100% oxygen Induction Type: IV induction Ventilation: Mask ventilation without difficulty Laryngoscope Size: Mac and 4 Grade View: Grade II Tube type: Oral Tube size: 7.0 mm Number of attempts: 1 Airway Equipment and Method: Stylet Placement Confirmation: ETT inserted through vocal cords under direct vision, positive ETCO2 and breath sounds checked- equal and bilateral Secured at: 23 cm Tube secured with: Tape Dental Injury: Teeth and Oropharynx as per pre-operative assessment

## 2022-07-21 NOTE — Anesthesia Preprocedure Evaluation (Addendum)
Anesthesia Evaluation  Patient identified by MRN, date of birth, ID band Patient awake    Reviewed: Allergy & Precautions, NPO status , Patient's Chart, lab work & pertinent test results  Airway Mallampati: II  TM Distance: <3 FB Neck ROM: Full    Dental  (+) Teeth Intact, Dental Advisory Given   Pulmonary former smoker   Pulmonary exam normal breath sounds clear to auscultation       Cardiovascular + Peripheral Vascular Disease and + DVT  + dysrhythmias Atrial Fibrillation + Valvular Problems/Murmurs MR  Rhythm:Irregular Rate:Abnormal  Echo 03/30/22: 1. Left ventricular ejection fraction, by estimation, is 60 to 65%. The  left ventricle has normal function. The left ventricle has no regional  wall motion abnormalities. There is moderate concentric left ventricular  hypertrophy. Left ventricular  diastolic function could not be evaluated.   2. Right ventricular systolic function is normal. The right ventricular  size is normal. There is normal pulmonary artery systolic pressure. The  estimated right ventricular systolic pressure is [id number] mmHg.   3. Left atrial size was moderately dilated.   4. The mitral valve is normal in structure. Mild mitral valve  regurgitation. No evidence of mitral stenosis.   5. The aortic valve is normal in structure. Aortic valve regurgitation is  not visualized. No aortic stenosis is present.   6. Aortic dilatation noted. There is mild dilatation of the aortic root,  measuring 41 mm. There is mild dilatation of the ascending aorta,  measuring 41 mm.   7. The inferior vena cava is normal in size with greater than 50%  respiratory variability, suggesting right atrial pressure of 3 mmHg.     Neuro/Psych negative neurological ROS  negative psych ROS   GI/Hepatic negative GI ROS, Neg liver ROS,,,  Endo/Other  negative endocrine ROS    Renal/GU negative Renal ROS     Musculoskeletal  (+)  Arthritis ,    Abdominal   Peds  Hematology  (+) Blood dyscrasia (Xarelto)   Anesthesia Other Findings Day of surgery medications reviewed with the patient.  Reproductive/Obstetrics                             Anesthesia Physical Anesthesia Plan  ASA: 3  Anesthesia Plan: General   Post-op Pain Management: Tylenol PO (pre-op)*   Induction: Intravenous  PONV Risk Score and Plan: 2 and Dexamethasone and Ondansetron  Airway Management Planned: Oral ETT  Additional Equipment: ClearSight  Intra-op Plan:   Post-operative Plan: Extubation in OR  Informed Consent: I have reviewed the patients History and Physical, chart, labs and discussed the procedure including the risks, benefits and alternatives for the proposed anesthesia with the patient or authorized representative who has indicated his/her understanding and acceptance.     Dental advisory given  Plan Discussed with: CRNA  Anesthesia Plan Comments: (2nd PIV)        Anesthesia Quick Evaluation

## 2022-07-21 NOTE — Transfer of Care (Signed)
Immediate Anesthesia Transfer of Care Note  Patient: Zachary Sanchez  Procedure(s) Performed: LEFT ATRIAL APPENDAGE OCCLUSION TRANSESOPHAGEAL ECHOCARDIOGRAM  Patient Location: PACU  Anesthesia Type:General  Level of Consciousness: awake, alert , and oriented  Airway & Oxygen Therapy: Patient Spontanous Breathing and Patient connected to nasal cannula oxygen  Post-op Assessment: Report given to RN and Post -op Vital signs reviewed and stable  Post vital signs: Reviewed and stable  Last Vitals:  Vitals Value Taken Time  BP 130/90 07/21/22 0852  Temp 98   Pulse 57 07/21/22 0855  Resp 15 07/21/22 0855  SpO2 97 % 07/21/22 0855  Vitals shown include unvalidated device data.  Last Pain:  Vitals:   07/21/22 0640  TempSrc:   PainSc: 0-No pain         Complications: No notable events documented.

## 2022-07-21 NOTE — Discharge Summary (Signed)
[facility]    Patient ID: Zachary Sanchez,  MRN: [id number], DOB/AGE: 16-Jun-1945 77 y.o.  Admit date: 07/21/2022 Discharge date: 07/21/2022  Primary Care Physician: Derinda Late, MD  Primary Cardiologist: None  Electrophysiologist: Vickie Epley, MD  Primary Discharge Diagnosis:  Permanent Atrial Fibrillation Poor candidacy for long term anticoagulation due to frequent nosebleeds  Procedures This Admission:  Transeptal Puncture Intra-procedural TEE which showed no LAA thrombus Left atrial appendage occlusive device placement on 07/21/22 by Dr. Quentin Ore.   This study demonstrated:    CONCLUSIONS:  1.Successful implantation of a WATCHMAN left atrial appendage occlusive device    2. TEE demonstrating no LAA thrombus 3. No early apparent complications.    Post Implant Anticoagulation Strategy: Continue Xarelto 20mg  PO daily for 45 days after implant. After 45 days, stop the Xarelto and start Plavix 75mg  PO daily to complete 6 months of post implant therapy. Plan for CT scan 60 days after implant to assess Watchman position.  Brief HPI: Zachary Sanchez is a 77 y.o. male with a history of HLD, gout, prostate cancer, aortic root enlargement, DVT, permanent afib with severe nose bleeding on anticoagulation.   Zachary Sanchez was previously followed by Dr. Rayann Heman. On his last office visit 06/15/2021, EKG showed permanent atrial fibrillation at 53 bpm and he was asymptomatic. He was continued on Xarelto for prior DVT/PE. He then called the office 11/15/2021 and reported having several episodes of epistaxis. He had stopped Xarelto for 2 days until his bleeding resolved. He then saw Dr. Quentin Ore for evaluation of [place] closure with Watchman. Pre procedure showed anatomy suitable to proceed.    Hospital Course:  The patient was admitted and underwent left atrial appendage occlusive device placement with 28mm Watchman FLX device.  He was monitored post procedure and was considered for same  day discharge given that he was doing well. Groin site was without complication on the day of discharge. Wound care and restrictions were reviewed with the patient. The patient has been scheduled for post procedure follow up with Kathyrn Drown, NP in approximately 1 month. He will continue Xarelto 20mg  PO daily for 45 days after implant. After 45 days, stop the Xarelto and start Plavix 75mg  PO daily to complete 6 months of post implant therapy. Plan for CT scan 60 days after implant to assess Watchman position.   Physical Exam: Vitals:   07/21/22 0930 07/21/22 0948 07/21/22 1003 07/21/22 1011  BP: (!) 138/92 (!) 136/95 (!) 123/108   Pulse: (!) 54 (!) 52 (!) 53   Resp: (!) 0 15 (!) 22 18  Temp:   97.6 F (36.4 C) 97.6 F (36.4 C)  TempSrc:   Axillary   SpO2: 95% 96% 94%   Weight:      Height:       General: Well developed, well nourished, NAD Cardiovascular: RRR with S1 S2. Extremities: No edema. Groin stable.  Neuro: Alert and oriented. No focal deficits. No facial asymmetry. MAE spontaneously. Psych: Responds to questions appropriately with normal affect.    Labs:   Lab Results  Component Value Date   WBC 8.0 06/29/2022   HGB 13.9 06/29/2022   HCT 41.7 06/29/2022   MCV 104 (H) 06/29/2022   PLT 215 06/29/2022   No results for input(s): "NA", "K", "CL", "CO2", "BUN", "CREATININE", "CALCIUM", "PROT", "BILITOT", "ALKPHOS", "ALT", "AST", "GLUCOSE" in the last 168 hours.  Invalid input(s): "LABALBU"   Discharge Medications:  Allergies as of 07/21/2022   No  Known Allergies      Medication List     TAKE these medications    allopurinol 300 MG tablet Commonly known as: ZYLOPRIM Take 300 mg by mouth daily.   cholecalciferol 1000 units tablet Commonly known as: VITAMIN D Take 2,000 Units by mouth daily.   ezetimibe 10 MG tablet Commonly known as: ZETIA Take 10 mg by mouth daily.   GLUCOSAMINE CHONDR COMPLEX PO Take 2 tablets by mouth daily.   ibuprofen 200 MG  tablet Commonly known as: ADVIL Take 400 mg by mouth every 6 (six) hours as needed for mild pain or headache.   rivaroxaban 20 MG Tabs tablet Commonly known as: XARELTO Take 20 mg by mouth daily with supper. Notes to patient: Restart TONIGHT 07/21/22   rosuvastatin 40 MG tablet Commonly known as: CRESTOR Take 40 mg by mouth daily.   zolpidem 10 MG tablet Commonly known as: AMBIEN Take 5 mg by mouth at bedtime as needed for sleep.        Disposition:  Home  Discharge Instructions     Diet - low sodium heart healthy   Complete by: As directed    Discharge instructions   Complete by: As directed    [facility] Procedure, Care After  Procedure MD: Dr. Benson Norway Clinical Coordinator: Lenice Llamas, RN  This sheet gives you information about how to care for yourself after your procedure. Your health care provider may also give you more specific instructions. If you have problems or questions, contact your health care provider.  What can I expect after the procedure? After the procedure, it is common to have: Bruising around your puncture site. Tenderness around your puncture site. Tiredness (fatigue).  Medication instructions It is very important to continue to take your blood thinner as directed by your doctor after the Watchman procedure. Call your procedure doctor's office with question or concerns. If you are on Coumadin (warfarin), you will have your INR checked the week after your procedure, with a goal INR of 2.0 - 3.0. Please follow your medication instructions on your discharge summary. Only take the medications listed on your discharge paperwork.  Follow up You will be seen in 1 month after your procedure You will have a repeat CT scan approximately 8 weeks after your procedure mark to check your device You will follow up the MD/APP who performed your procedure 6 months after your procedure The Watchman Clinical Coordinator will check in with you from time to  time, including 1 and 2 years after your procedure.    Follow these instructions at home: Puncture site care  Follow instructions from your health care provider about how to take care of your puncture site. Make sure you: If present, leave stitches (sutures), skin glue, or adhesive strips in place.  If a large square bandage is present, this may be removed 24 hours after surgery.  Check your puncture site every day for signs of infection. Check for: Redness, swelling, or pain. Fluid or blood. If your puncture site starts to bleed, lie down on your back, apply firm pressure to the area, and contact your health care provider. Warmth. Pus or a bad smell. Driving Do not drive yourself home if you received sedation Do not drive for at least 4 days after your procedure or however long your health care provider recommends. (Do not resume driving if you have previously been instructed not to drive for other health reasons.) Do not spend greater than 1 hour at a time in a  car for the first 3 days. Stop and take a break with a 5 minute walk at least every hour.  Do not drive or use heavy machinery while taking prescription pain medicine.  Activity Avoid activities that take a lot of effort, including exercise, for at least 7 days after your procedure. For the first 3 days, avoid sitting for longer than one hour at a time.  Avoid alcoholic beverages, signing paperwork, or participating in legal proceedings for 24 hours after receiving sedation Do not lift anything that is heavier than 10 lb (4.5 kg) for one week.  No sexual activity for 1 week.  Return to your normal activities as told by your health care provider. Ask your health care provider what activities are safe for you. General instructions Take over-the-counter and prescription medicines only as told by your health care provider. Do not use any products that contain nicotine or tobacco, such as cigarettes and e-cigarettes. If you need help  quitting, ask your health care provider. You may shower after 24 hours, but Do not take baths, swim, or use a hot tub for 1 week.  Do not drink alcohol for 24 hours after your procedure. Keep all follow-up visits as told by your health care provider. This is important. Dental Work: You will require antibiotics prior to any dental work, including cleanings, for 6 months after your Watchman implantation to help protect you from infection. After 6 months, antibiotics are no longer required. Contact a health care provider if: You have redness, mild swelling, or pain around your puncture site. You have soreness in your throat or at your puncture site that does not improve after several days You have fluid or blood coming from your puncture site that stops after applying firm pressure to the area. Your puncture site feels warm to the touch. You have pus or a bad smell coming from your puncture site. You have a fever. You have chest pain or discomfort that spreads to your neck, jaw, or arm. You are sweating a lot. You feel nauseous. You have a fast or irregular heartbeat. You have shortness of breath. You are dizzy or light-headed and feel the need to lie down. You have pain or numbness in the arm or leg closest to your puncture site. Get help right away if: Your puncture site suddenly swells. Your puncture site is bleeding and the bleeding does not stop after applying firm pressure to the area. These symptoms may represent a serious problem that is an emergency. Do not wait to see if the symptoms will go away. Get medical help right away. Call your local emergency services (911 in the [place] Do not drive yourself to the hospital. Summary After the procedure, it is normal to have bruising and tenderness at the puncture site in your groin, neck, or forearm. Check your puncture site every day for signs of infection. Get help right away if your puncture site is bleeding and the bleeding does not stop  after applying firm pressure to the area. This is a medical emergency.  This information is not intended to replace advice given to you by your health care provider. Make sure you discuss any questions you have with your health care provider.   Increase activity slowly   Complete by: As directed        Follow-up Information     Tommie Raymond, NP Follow up on 08/24/2022.   Specialty: Cardiology Why: at 9:30am. Please arrive at 9:15am. Contact information: [place]  [place] [id number] [phone]                 Duration of Discharge Encounter: Greater than 30 minutes including physician time.  Signed, Kathyrn Drown, NP  07/21/2022 2:23 PM

## 2022-07-22 ENCOUNTER — Telehealth: Payer: Self-pay

## 2022-07-22 MED FILL — Fentanyl Citrate Preservative Free (PF) Inj 100 MCG/2ML: INTRAMUSCULAR | Qty: 2 | Status: AC

## 2022-07-22 NOTE — Anesthesia Postprocedure Evaluation (Signed)
Anesthesia Post Note  Patient: Zachary Sanchez  Procedure(s) Performed: LEFT ATRIAL APPENDAGE OCCLUSION TRANSESOPHAGEAL ECHOCARDIOGRAM     Patient location during evaluation: Cath Lab Anesthesia Type: General Level of consciousness: awake and alert Pain management: pain level controlled Vital Signs Assessment: post-procedure vital signs reviewed and stable Respiratory status: spontaneous breathing, nonlabored ventilation, respiratory function stable and patient connected to nasal cannula oxygen Cardiovascular status: blood pressure returned to baseline and stable Postop Assessment: no apparent nausea or vomiting Anesthetic complications: no   No notable events documented.  Last Vitals:  Vitals:   07/21/22 1130 07/21/22 1200  BP:  127/88  Pulse: 71 61  Resp: 20 16  Temp:    SpO2: 94% 95%    Last Pain:  Vitals:   07/21/22 1003  TempSrc: Axillary  PainSc:                  Collene Schlichter

## 2022-07-22 NOTE — Telephone Encounter (Signed)
  [facility]   Watchman Team  Contacted the patient regarding discharge from [facility] on 07/21/2022  The patient understands to follow up with Georgie Chard on 08/24/2022 in preparation for imaging on 09/21/2022.  The patient understands discharge instructions? Yes  The patient understands medications and regimen? Yes   The patient reports groin site looks healthy with no S/S of infection or bleeding  The patient understands to call with any questions or concerns prior to scheduled visit.

## 2022-08-02 DIAGNOSIS — Z0000 Encounter for general adult medical examination without abnormal findings: Secondary | ICD-10-CM | POA: Diagnosis not present

## 2022-08-02 DIAGNOSIS — I2510 Atherosclerotic heart disease of native coronary artery without angina pectoris: Secondary | ICD-10-CM | POA: Diagnosis not present

## 2022-08-02 DIAGNOSIS — I7121 Aneurysm of the ascending aorta, without rupture: Secondary | ICD-10-CM | POA: Diagnosis not present

## 2022-08-02 DIAGNOSIS — I4819 Other persistent atrial fibrillation: Secondary | ICD-10-CM | POA: Diagnosis not present

## 2022-08-02 DIAGNOSIS — Z23 Encounter for immunization: Secondary | ICD-10-CM | POA: Diagnosis not present

## 2022-08-16 ENCOUNTER — Telehealth (HOSPITAL_COMMUNITY): Payer: Self-pay

## 2022-08-16 ENCOUNTER — Telehealth: Payer: Self-pay | Admitting: Cardiology

## 2022-08-16 NOTE — Telephone Encounter (Signed)
Calling to see if his appt on 5/8 can be moved to the 5/7. Please advise

## 2022-08-16 NOTE — Telephone Encounter (Signed)
Informed the patient that unfortunately the Structural Team does not have clinic on Tuesdays.  He agreed to keep schedule as is, with stress test Tuesday and clinic visit Wednesday.

## 2022-08-16 NOTE — Telephone Encounter (Signed)
Attempted to contact the patient. Detailed instructions left on the patient's answering machine. Asked to call back with any questions. S.Aissa Lisowski EMT/CCT

## 2022-08-22 ENCOUNTER — Ambulatory Visit: Payer: Medicare Other

## 2022-08-23 ENCOUNTER — Ambulatory Visit: Payer: Medicare Other | Attending: Cardiology

## 2022-08-23 DIAGNOSIS — C61 Malignant neoplasm of prostate: Secondary | ICD-10-CM | POA: Diagnosis not present

## 2022-08-23 DIAGNOSIS — I4821 Permanent atrial fibrillation: Secondary | ICD-10-CM | POA: Insufficient documentation

## 2022-08-23 DIAGNOSIS — R931 Abnormal findings on diagnostic imaging of heart and coronary circulation: Secondary | ICD-10-CM | POA: Insufficient documentation

## 2022-08-23 DIAGNOSIS — Z95818 Presence of other cardiac implants and grafts: Secondary | ICD-10-CM | POA: Insufficient documentation

## 2022-08-23 DIAGNOSIS — I10 Essential (primary) hypertension: Secondary | ICD-10-CM | POA: Insufficient documentation

## 2022-08-23 DIAGNOSIS — Z01818 Encounter for other preprocedural examination: Secondary | ICD-10-CM | POA: Insufficient documentation

## 2022-08-23 MED ORDER — TECHNETIUM TC 99M TETROFOSMIN IV KIT
9.7000 | PACK | Freq: Once | INTRAVENOUS | Status: AC | PRN
  Administered 2022-08-23: 9.7 via INTRAVENOUS

## 2022-08-23 NOTE — Progress Notes (Unsigned)
[facility]                                     Cardiology Office Note:    Date:  08/24/2022   ID:  Zachary Sanchez, DOB [birth year], MRN [id number]  PCP:  Mosetta Putt, MD  [facility] Cardiologist:  None  [facility] Electrophysiologist:  Lanier Prude, MD   Referring MD: Mosetta Putt, MD   Chief Complaint  Patient presents with   Follow-up    1 month s/p LAAO    History of Present Illness:    Zachary Sanchez is a 77 y.o. male with a hx of HLD, gout, prostate cancer, aortic root enlargement, DVT, permanent afib with severe nose bleeding on anticoagulation who is now s/p Watchman closure and is being seen for 1 month f/u.     Zachary Sanchez was previously followed by Dr. Johney Frame. On his last office visit 06/15/2021, EKG showed permanent atrial fibrillation at 53 bpm and he was asymptomatic. He was continued on Xarelto for prior DVT/PE. He then called the office 11/15/2021 and reported having several episodes of epistaxis. He had stopped Xarelto for 2 days until his bleeding resolved. He then saw Dr. Lalla Brothers for evaluation of LAAO closure with Watchman. Pre procedure showed anatomy suitable to proceed.    He is now s/p successful LAAO closure with 35mm Watchman FLX device. Plan was to continue Xarelto 20mg  PO daily for 45 days after implant (09/04/22). He will then stop the Xarelto and start Plavix 75mg  PO daily to complete 6 months (01/20/2023) of post implant therapy. Plan for CT scan 60 days after implant to assess Watchman position.   Today he reports that he has been doing well with no issues with chest pain, palpitations, LE edema, bleeding in stool or urine, dizziness, or syncope. He is beck to his normal exercise routine and is feeling great.   Past Medical History:  Diagnosis Date   Aortic root enlargement (HCC)    aortic root 40mm in size   Atrial flutter (HCC)    Biatrial enlargement    Colonic polyp    DJD (degenerative joint disease)    DVT (deep venous  thrombosis) (HCC) 03/1016   post knee surgery, on eliquis   Dysrhythmia    a fib   Gout    Heart murmur    when younger   History of benign prostatic hypertrophy    Hyperlipidemia    Persistent atrial fibrillation (HCC)    longstanding persistent, asymptomatic   Pneumonia    Presence of Watchman left atrial appendage closure device 07/21/2022   35mm Watchman device by Dr. Lalla Brothers   Prostate cancer [facility])    Pulmonary emboli (HCC) 03/2016   from DVT post knee surgery   Rupture of quadriceps tendon 05/10/2017   Wears glasses     Past Surgical History:  Procedure Laterality Date   FRACTURE SURGERY     ankles and fingers   INGUINAL HERNIA REPAIR Left 12/16/2020   Procedure: LAPAROSCOPIC LEFT AND  RIGHT INGUINAL HERNIA REPAIR; TAP BLOCKS;  Surgeon: Karie Soda, MD;  Location: [facility] ORS;  Service: General;  Laterality: Left;  GEN AND LOCAL   KNEE SURGERY     right; menicus tear    LAPAROSCOPIC CHOLECYSTECTOMY SINGLE SITE WITH INTRAOPERATIVE CHOLANGIOGRAM N/A 09/04/2020   Procedure: LAPAROSCOPIC CHOLECYSTECTOMY SINGLE SITE WITH INTRAOPERATIVE CHOLANGIOGRAM BILATERAL TAP BLOCK;  Surgeon: Michaell Cowing,  Viviann Spare, MD;  Location: [facility];  Service: General;  Laterality: N/A;   LEFT ATRIAL APPENDAGE OCCLUSION N/A 07/21/2022   Procedure: LEFT ATRIAL APPENDAGE OCCLUSION;  Surgeon: Lanier Prude, MD;  Location: [facility] INVASIVE CV LAB;  Service: Cardiovascular;  Laterality: N/A;   LYMPHADENECTOMY Bilateral 07/09/2015   Procedure: PELVIC LYMPHADENECTOMY;  Surgeon: Heloise Purpura, MD;  Location: [facility];  Service: Urology;  Laterality: Bilateral;   NASAL ENDOSCOPY WITH EPISTAXIS CONTROL N/A 05/03/2022   Procedure: NASAL ENDOSCOPY WITH EPISTAXIS CONTROL;  Surgeon: Serena Colonel, MD;  Location: [facility] OR;  Service: ENT;  Laterality: N/A;   QUADRICEPS REPAIR     ROBOT ASSISTED LAPAROSCOPIC RADICAL PROSTATECTOMY N/A 07/09/2015   Procedure: XI ROBOTIC ASSISTED LAPAROSCOPIC RADICAL PROSTATECTOMY LEVEL 2;  Surgeon: Heloise Purpura, MD;  Location: [facility];  Service: Urology;  Laterality: N/A;   TEE WITHOUT CARDIOVERSION N/A 07/21/2022   Procedure: TRANSESOPHAGEAL ECHOCARDIOGRAM;  Surgeon: Lanier Prude, MD;  [facility]: [facility] INVASIVE CV LAB;  Service: Cardiovascular;  Laterality: N/A;   TONSILLECTOMY     TOTAL KNEE ARTHROPLASTY Right 03/04/2016   Procedure: RIGHT TOTAL KNEE ARTHROPLASTY;  Surgeon: Eugenia Mcalpine, MD;  Location: [facility];  Service: Orthopedics;  Laterality: Right;    Current Medications: Current Meds  Medication Sig   allopurinol (ZYLOPRIM) 300 MG tablet Take 300 mg by mouth daily.   cholecalciferol (VITAMIN D) 1000 units tablet Take 2,000 Units by mouth daily.   clopidogrel (PLAVIX) 75 MG tablet Take 1 tablet (75 mg total) by mouth daily. START ON 5/20   ezetimibe (ZETIA) 10 MG tablet Take 10 mg by mouth daily.   Glucosamine-Chondroitin (GLUCOSAMINE CHONDR COMPLEX PO) Take 2 tablets by mouth daily.   ibuprofen (ADVIL) 200 MG tablet Take 400 mg by mouth every 6 (six) hours as needed for mild pain or headache.   rivaroxaban (XARELTO) 20 MG TABS tablet Take 20 mg by mouth daily with supper. STOP ON 5/19   rosuvastatin (CRESTOR) 40 MG tablet Take 40 mg by mouth daily.   zolpidem (AMBIEN) 10 MG tablet Take 5 mg by mouth at bedtime as needed for sleep.     Allergies:   Patient has no known allergies.   Social History   Socioeconomic History   Marital status: Married    Spouse name: Not on file   Number of children: Not on file   Years of education: Not on file   Highest education level: Not on file  Occupational History   Occupation: Biomedical engineer    Employer: [employer]  Tobacco Use   Smoking status: Former    Packs/day: 1.00    Years: 20.00    Additional pack years: 0.00    Total pack years: 20.00    Types: Cigarettes    Quit date: 02/11/1979    Years since quitting: 43.5   Smokeless tobacco: Never  Vaping Use   Vaping Use: Never used  Substance and Sexual Activity    Alcohol use: Yes    Alcohol/week: 21.0 standard drinks of alcohol    Types: 21 Glasses of wine per week    Comment: 2-3 glasses of wine daily   Drug use: No   Sexual activity: Not Currently  Other Topics Concern   Not on file  Social History Narrative   Not on file   Social Determinants of Health   Financial Resource Strain: Not on file  Food Insecurity: Not on file  Transportation Needs: Not on file  Physical Activity: Not on file  Stress: Not on file  Social Connections: Not on file    Family History: The patient's family history includes Alcohol abuse in his father; Cancer in his brother; Heart attack in his father.  ROS:   Please see the history of present illness.    All other systems reviewed and are negative.  EKGs/Labs/Other Studies Reviewed:    The following studies were reviewed today:  Watchman 07/21/22:  Procedures This Admission:  Transeptal Puncture Intra-procedural TEE which showed no LAA thrombus Left atrial appendage occlusive device placement on 07/21/22 by Dr. Lalla Brothers.    This study demonstrated:    CONCLUSIONS:  1.Successful implantation of a WATCHMAN left atrial appendage occlusive device    2. TEE demonstrating no LAA thrombus 3. No early apparent complications.    Post Implant Anticoagulation Strategy: Continue Xarelto 20mg  PO daily for 45 days after implant. After 45 days, stop the Xarelto and start Plavix 75mg  PO daily to complete 6 months of post implant therapy. Plan for CT scan 60 days after implant to assess Watchman position.   EKG:  EKG is not ordered today.    Recent Labs: 05/12/2022: ALT 45 06/29/2022: BUN 12; Creatinine, Ser 0.88; Hemoglobin 13.9; Platelets 215; Potassium 4.3; Sodium 140  Recent Lipid Panel No results found for: "CHOL", "TRIG", "HDL", "CHOLHDL", "VLDL", "LDLCALC", "LDLDIRECT"  Physical Exam:    VS:  BP 104/80   Pulse (!) 51   Ht 6\' 2"  (1.88 m)   Wt 218 lb 12.8 oz (99.2 kg)   SpO2 99%   BMI 28.09 kg/m     Wt  Readings from Last 3 Encounters:  08/24/22 218 lb 12.8 oz (99.2 kg)  07/21/22 215 lb (97.5 kg)  06/29/22 220 lb 12.8 oz (100.2 kg)    General: Well developed, well nourished, NAD Neck: Negative for carotid bruits. No JVD Lungs:Clear to ausculation bilaterally. No wheezes, rales, or rhonchi. Breathing is unlabored. Cardiovascular: RRR with S1 S2. No murmurs Extremities: No edema. No clubbing or cyanosis. DP/PT pulses 2+ bilaterally Neuro: Alert and oriented. No focal deficits. No facial asymmetry. MAE spontaneously. Psych: Responds to questions appropriately with normal affect.    ASSESSMENT/PLAN:    Permanent atrial fibrillation: s/p LAAO closure with Watchman FLX 35mm device. He was restarted on Xarelto 20mg  PO daily for 45 days after implant and will continue this through 09/04/22 then stop. He will then start Plavix 75mg  QD to complete 6 months of therapy (01/20/23). Post LAAO CT instructions reviewed with understanding. Obtain BMET today. Plan 6 month f/u with our team.   He will require dental SBE through 01/20/2023 however has deferred cleanings until after 6 months.    HLD: Stable, continue statin   Prostate cancer: No change.   Medication Adjustments/Labs and Tests Ordered: Current medicines are reviewed at length with the patient today.  Concerns regarding medicines are outlined above.  Orders Placed This Encounter  Procedures   Basic metabolic panel   Meds ordered this encounter  Medications   clopidogrel (PLAVIX) 75 MG tablet    Sig: Take 1 tablet (75 mg total) by mouth daily. START ON 5/20    Dispense:  90 tablet    Refill:  3    STOP XARELTO ON 5/19 AND START ON 5/20    Patient Instructions  Medication Instructions:  Your physician has recommended you make the following change in your medication:  STOP XARELTO ON 5/19 START PLAVIX 75 MG DAILY ON 5/20  *If you need a refill on your cardiac medications before  your next appointment, please call your pharmacy*   Lab  Work: TODAY: BMET If you have labs (blood work) drawn today and your tests are completely normal, you will receive your results only by: MyChart Message (if you have MyChart) OR A paper copy in the mail If you have any lab test that is abnormal or we need to change your treatment, we will call you to review the results.   Testing/Procedures: SEE CT INSTRUCTIONS    Follow-Up: At [facility], you and your health needs are our priority.  As part of our continuing mission to provide you with exceptional heart care, we have created designated Provider Care Teams.  These Care Teams include your primary Cardiologist (physician) and Advanced Practice Providers (APPs -  Physician Assistants and Nurse Practitioners) who all work together to provide you with the care you need, when you need it.  We recommend signing up for the patient portal called "MyChart".  Sign up information is provided on this After Visit Summary.  MyChart is used to connect with patients for Virtual Visits (Telemedicine).  Patients are able to view lab/test results, encounter notes, upcoming appointments, etc.  Non-urgent messages can be sent to your provider as well.   To learn more about what you can do with MyChart, go to [email or web address].    Your next appointment:   KEEP SCHEDULED FOLLOW-UP   Signed, Georgie Chard, NP  08/24/2022 10:08 AM    [facility]

## 2022-08-24 ENCOUNTER — Ambulatory Visit: Payer: Medicare Other | Admitting: Cardiology

## 2022-08-24 VITALS — BP 104/80 | HR 51 | Ht 74.0 in | Wt 218.8 lb

## 2022-08-24 DIAGNOSIS — Z95818 Presence of other cardiac implants and grafts: Secondary | ICD-10-CM | POA: Diagnosis not present

## 2022-08-24 DIAGNOSIS — I4821 Permanent atrial fibrillation: Secondary | ICD-10-CM

## 2022-08-24 DIAGNOSIS — C61 Malignant neoplasm of prostate: Secondary | ICD-10-CM

## 2022-08-24 DIAGNOSIS — Z01818 Encounter for other preprocedural examination: Secondary | ICD-10-CM | POA: Diagnosis not present

## 2022-08-24 DIAGNOSIS — I10 Essential (primary) hypertension: Secondary | ICD-10-CM

## 2022-08-24 LAB — BASIC METABOLIC PANEL
BUN/Creatinine Ratio: 12 (ref 10–24)
BUN: 9 mg/dL (ref 8–27)
CO2: 22 mmol/L (ref 20–29)
Calcium: 9.9 mg/dL (ref 8.6–10.2)
Chloride: 102 mmol/L (ref 96–106)
Creatinine, Ser: 0.78 mg/dL (ref 0.76–1.27)
Glucose: 97 mg/dL (ref 70–99)
Potassium: 4.4 mmol/L (ref 3.5–5.2)
Sodium: 140 mmol/L (ref 134–144)
eGFR: 92 mL/min/{1.73_m2} (ref >59)

## 2022-08-24 MED ORDER — CLOPIDOGREL BISULFATE 75 MG PO TABS: 75.0000 mg | ORAL_TABLET | Freq: Every day | ORAL | 3 refills | Status: DC

## 2022-08-24 NOTE — Patient Instructions (Signed)
Medication Instructions:  Your physician has recommended you make the following change in your medication:  STOP XARELTO ON 5/19 START PLAVIX 75 MG DAILY ON 5/20  *If you need a refill on your cardiac medications before your next appointment, please call your pharmacy*   Lab Work: TODAY: BMET If you have labs (blood work) drawn today and your tests are completely normal, you will receive your results only by: MyChart Message (if you have MyChart) OR A paper copy in the mail If you have any lab test that is abnormal or we need to change your treatment, we will call you to review the results.   Testing/Procedures: SEE CT INSTRUCTIONS    Follow-Up: At [facility], you and your health needs are our priority.  As part of our continuing mission to provide you with exceptional heart care, we have created designated Provider Care Teams.  These Care Teams include your primary Cardiologist (physician) and Advanced Practice Providers (APPs -  Physician Assistants and Nurse Practitioners) who all work together to provide you with the care you need, when you need it.  We recommend signing up for the patient portal called "MyChart".  Sign up information is provided on this After Visit Summary.  MyChart is used to connect with patients for Virtual Visits (Telemedicine).  Patients are able to view lab/test results, encounter notes, upcoming appointments, etc.  Non-urgent messages can be sent to your provider as well.   To learn more about what you can do with MyChart, go to [email or web address].    Your next appointment:   KEEP SCHEDULED FOLLOW-UP

## 2022-08-25 ENCOUNTER — Ambulatory Visit (HOSPITAL_COMMUNITY): Payer: Medicare Other | Attending: Cardiology

## 2022-08-25 DIAGNOSIS — R9431 Abnormal electrocardiogram [ECG] [EKG]: Secondary | ICD-10-CM | POA: Insufficient documentation

## 2022-08-25 DIAGNOSIS — I4891 Unspecified atrial fibrillation: Secondary | ICD-10-CM | POA: Insufficient documentation

## 2022-08-25 LAB — MYOCARDIAL PERFUSION IMAGING
Base ST Depression (mm): 0 mm
LV dias vol: 129 mL (ref 62–150)
LV sys vol: 57 mL
Nuc Stress EF: 56 %
Peak HR: 72 {beats}/min
Rest HR: 48 {beats}/min
Rest Nuclear Isotope Dose: 9.7 mCi
SDS: 2
SRS: 0
SSS: 2
ST Depression (mm): 0 mm
Stress Nuclear Isotope Dose: 32.4 mCi
TID: 1.07

## 2022-08-25 MED ORDER — REGADENOSON 0.4 MG/5ML IV SOLN
0.4000 mg | Freq: Once | INTRAVENOUS | Status: AC
Start: 2022-08-25 — End: 2022-08-25
  Administered 2022-08-25: 0.4 mg via INTRAVENOUS

## 2022-08-25 MED ORDER — TECHNETIUM TC 99M TETROFOSMIN IV KIT
32.4000 | PACK | Freq: Once | INTRAVENOUS | Status: AC | PRN
  Administered 2022-08-25: 32.4 via INTRAVENOUS

## 2022-08-25 MED FILL — Regadenoson IV Inj 0.4 MG/5ML (0.08 MG/ML): INTRAVENOUS | Qty: 5 | Status: AC

## 2022-08-29 DIAGNOSIS — Z8781 Personal history of (healed) traumatic fracture: Secondary | ICD-10-CM | POA: Diagnosis not present

## 2022-08-29 DIAGNOSIS — Z8546 Personal history of malignant neoplasm of prostate: Secondary | ICD-10-CM | POA: Diagnosis not present

## 2022-09-20 ENCOUNTER — Telehealth (HOSPITAL_COMMUNITY): Payer: Self-pay | Admitting: *Deleted

## 2022-09-20 NOTE — Telephone Encounter (Signed)
Patient returning call about his upcoming cardiac imaging study; pt verbalizes understanding of appt date/time, parking situation and where to check in, pre-test NPO status, and verified current allergies; name and call back number provided for further questions should they arise  Larey Brick RN Navigator Cardiac Imaging [facility] [phone] office [phone] cell  Patient aware to arrive at 9am.

## 2022-09-20 NOTE — Telephone Encounter (Signed)
Attempted to call patient regarding upcoming cardiac CT appointment. °Left message on voicemail with name and callback number ° °Salvador Bigbee RN Navigator Cardiac Imaging °[facility] °[phone] Office °[phone] Cell ° °

## 2022-09-21 ENCOUNTER — Ambulatory Visit (HOSPITAL_COMMUNITY)
Admission: RE | Admit: 2022-09-21 | Discharge: 2022-09-21 | Disposition: A | Discharge status: Home / Self Care | Payer: Medicare Other | Source: Ambulatory Visit | Attending: Cardiology | Admitting: Cardiology

## 2022-09-21 DIAGNOSIS — I4821 Permanent atrial fibrillation: Secondary | ICD-10-CM

## 2022-09-21 DIAGNOSIS — Z95818 Presence of other cardiac implants and grafts: Secondary | ICD-10-CM | POA: Diagnosis not present

## 2022-09-21 MED ORDER — IOHEXOL 350 MG/ML SOLN
100.0000 mL | Freq: Once | INTRAVENOUS | Status: AC | PRN
  Administered 2022-09-21: 100 mL via INTRAVENOUS

## 2022-09-26 ENCOUNTER — Telehealth: Payer: Self-pay | Admitting: Gastroenterology

## 2022-09-26 NOTE — Telephone Encounter (Signed)
PT needs to have records sent to [place] from [place]. Requesting Dr. Lavon Paganini; wants to have a second opinion

## 2022-09-27 ENCOUNTER — Telehealth: Payer: Self-pay

## 2022-09-27 NOTE — Telephone Encounter (Signed)
-----   Message from Lanier Prude, MD sent at 09/26/2022 10:14 PM EDT ----- Sharyne Peach looks great. The appendage is fully sealed. OK to continue with planned post implant medication regimen.  Sheria Lang T. Lalla Brothers, MD, [facility], [facility] Cardiac Electrophysiology

## 2022-09-27 NOTE — Telephone Encounter (Signed)
Left message for patient with Dr. Lovena Neighbours notes. Reiterated to him he should be taking Plavix only (no Xarelto).  Instructed him to call back if he has any questions or concerns.

## 2022-09-29 NOTE — Telephone Encounter (Signed)
Hi Dr. Lavon Paganini,  Patient called  and ask specifically for you to provide him with a second opinion if possible. Has GI history with [facility], his Gi records are aviable in Epic through Care Everywhere. There is also a pathology report in [facility] for you to review and advise on scheduling.    Thanks

## 2022-09-29 NOTE — Telephone Encounter (Signed)
Please make patient aware that my next available is likely in October, if he is okay please schedule it.  Thank you

## 2022-10-04 ENCOUNTER — Encounter: Payer: Self-pay | Admitting: Gastroenterology

## 2022-10-18 DIAGNOSIS — B353 Tinea pedis: Secondary | ICD-10-CM | POA: Diagnosis not present

## 2022-10-18 DIAGNOSIS — B351 Tinea unguium: Secondary | ICD-10-CM | POA: Diagnosis not present

## 2023-01-03 DIAGNOSIS — K080 Exfoliation of teeth due to systemic causes: Secondary | ICD-10-CM | POA: Diagnosis not present

## 2023-01-10 NOTE — Progress Notes (Deleted)
91.40 cm/s AV Vmean:          63.200 cm/s AV VTI:            0.173 m AV Peak Grad:      3.3 mmHg AV Mean Grad:      2.0 mmHg LVOT Vmax:         95.30 cm/s LVOT Vmean:        65.900 cm/s LVOT VTI:          0.176 m LVOT/AV VTI ratio: 1.02  AORTA Ao Root diam: 4.10 cm Ao Asc diam:  4.10 cm  MITRAL VALVE               TRICUSPID VALVE MV Area (PHT):             TR Peak grad:   20.8 mmHg MV Decel Time:             TR Vmax:        228.00 cm/s MV E velocity: 69.00 cm/s  Estimated RAP:  3.00 mmHg RVSP:           23.8 mmHg  SHUNTS Systemic VTI:  0.18 m Systemic Diam: 2.50 cm  Armanda Magic MD Electronically signed by Armanda Magic MD Signature Date/Time: 03/30/2022/12:48:24 PM    Final   TEE  ECHO TEE 07/21/2022  Narrative TRANSESOPHOGEAL ECHO REPORT    Patient Name:   Zachary Sanchez Date of Exam: 07/21/2022 Medical Rec #:  [id number]    Height:       74.0  in Accession #:    [id number]   Weight:       215.0 lb Date of Birth:  01/10/1946   BSA:          2.241 m Patient Age:    76 years     BP:           140/96 mmHg Patient Gender: M            HR:           59 bpm. Exam Location:  Inpatient  Procedure: Transesophageal Echo, 3D Echo, Color Doppler and Cardiac Doppler  Indications:     I48.2 Chronic atrial fibrillation  History:         Patient has prior history of Echocardiogram examinations, most recent 03/30/2022. Arrythmias:Atrial Fibrillation; Risk Factors:Dyslipidemia.  Sonographer:     Irving Burton Senior RDCS Referring Phys:  [id number] Lanier Prude Diagnosing Phys: Riley Lam MD   Sonographer Comments: 35mm Watchman FLX Device Implanted   PROCEDURE: After discussion of the risks and benefits of a TEE, an informed consent was obtained from the patient. The transesophogeal probe was passed without difficulty through the esophogus of the patient. Sedation performed by different physician. The patient was monitored while under deep sedation. The patient developed no complications during the procedure.  IMPRESSIONS   1. Prior to procedure, large left atrial appendage. Chicken wing appendage that tapers distally. Maximal dimension 2.95 cm. 2. A Mid posterior transeptal puncture was performed away from large PFO. 3. A 35 mm Watchman FLX was performed. No peri-device leak. There was a 1/3 to 1/2 shoulder. Average compression ~ 24%. 4. No change in trivial pericardial effusion. 5. Iatrogenic ASD with left to right shunting. 6. Left ventricular ejection fraction, by estimation, is 60 to 65%. The left ventricle has normal function. 7. Right ventricular systolic function is normal. The right ventricular size is normal. 8. Left atrial size was moderately dilated. No left atrial/left atrial  91.40 cm/s AV Vmean:          63.200 cm/s AV VTI:            0.173 m AV Peak Grad:      3.3 mmHg AV Mean Grad:      2.0 mmHg LVOT Vmax:         95.30 cm/s LVOT Vmean:        65.900 cm/s LVOT VTI:          0.176 m LVOT/AV VTI ratio: 1.02  AORTA Ao Root diam: 4.10 cm Ao Asc diam:  4.10 cm  MITRAL VALVE               TRICUSPID VALVE MV Area (PHT):             TR Peak grad:   20.8 mmHg MV Decel Time:             TR Vmax:        228.00 cm/s MV E velocity: 69.00 cm/s  Estimated RAP:  3.00 mmHg RVSP:           23.8 mmHg  SHUNTS Systemic VTI:  0.18 m Systemic Diam: 2.50 cm  Armanda Magic MD Electronically signed by Armanda Magic MD Signature Date/Time: 03/30/2022/12:48:24 PM    Final   TEE  ECHO TEE 07/21/2022  Narrative TRANSESOPHOGEAL ECHO REPORT    Patient Name:   Zachary Sanchez Date of Exam: 07/21/2022 Medical Rec #:  [id number]    Height:       74.0  in Accession #:    [id number]   Weight:       215.0 lb Date of Birth:  01/10/1946   BSA:          2.241 m Patient Age:    76 years     BP:           140/96 mmHg Patient Gender: M            HR:           59 bpm. Exam Location:  Inpatient  Procedure: Transesophageal Echo, 3D Echo, Color Doppler and Cardiac Doppler  Indications:     I48.2 Chronic atrial fibrillation  History:         Patient has prior history of Echocardiogram examinations, most recent 03/30/2022. Arrythmias:Atrial Fibrillation; Risk Factors:Dyslipidemia.  Sonographer:     Irving Burton Senior RDCS Referring Phys:  [id number] Lanier Prude Diagnosing Phys: Riley Lam MD   Sonographer Comments: 35mm Watchman FLX Device Implanted   PROCEDURE: After discussion of the risks and benefits of a TEE, an informed consent was obtained from the patient. The transesophogeal probe was passed without difficulty through the esophogus of the patient. Sedation performed by different physician. The patient was monitored while under deep sedation. The patient developed no complications during the procedure.  IMPRESSIONS   1. Prior to procedure, large left atrial appendage. Chicken wing appendage that tapers distally. Maximal dimension 2.95 cm. 2. A Mid posterior transeptal puncture was performed away from large PFO. 3. A 35 mm Watchman FLX was performed. No peri-device leak. There was a 1/3 to 1/2 shoulder. Average compression ~ 24%. 4. No change in trivial pericardial effusion. 5. Iatrogenic ASD with left to right shunting. 6. Left ventricular ejection fraction, by estimation, is 60 to 65%. The left ventricle has normal function. 7. Right ventricular systolic function is normal. The right ventricular size is normal. 8. Left atrial size was moderately dilated. No left atrial/left atrial  91.40 cm/s AV Vmean:          63.200 cm/s AV VTI:            0.173 m AV Peak Grad:      3.3 mmHg AV Mean Grad:      2.0 mmHg LVOT Vmax:         95.30 cm/s LVOT Vmean:        65.900 cm/s LVOT VTI:          0.176 m LVOT/AV VTI ratio: 1.02  AORTA Ao Root diam: 4.10 cm Ao Asc diam:  4.10 cm  MITRAL VALVE               TRICUSPID VALVE MV Area (PHT):             TR Peak grad:   20.8 mmHg MV Decel Time:             TR Vmax:        228.00 cm/s MV E velocity: 69.00 cm/s  Estimated RAP:  3.00 mmHg RVSP:           23.8 mmHg  SHUNTS Systemic VTI:  0.18 m Systemic Diam: 2.50 cm  Armanda Magic MD Electronically signed by Armanda Magic MD Signature Date/Time: 03/30/2022/12:48:24 PM    Final   TEE  ECHO TEE 07/21/2022  Narrative TRANSESOPHOGEAL ECHO REPORT    Patient Name:   Zachary Sanchez Date of Exam: 07/21/2022 Medical Rec #:  [id number]    Height:       74.0  in Accession #:    [id number]   Weight:       215.0 lb Date of Birth:  01/10/1946   BSA:          2.241 m Patient Age:    76 years     BP:           140/96 mmHg Patient Gender: M            HR:           59 bpm. Exam Location:  Inpatient  Procedure: Transesophageal Echo, 3D Echo, Color Doppler and Cardiac Doppler  Indications:     I48.2 Chronic atrial fibrillation  History:         Patient has prior history of Echocardiogram examinations, most recent 03/30/2022. Arrythmias:Atrial Fibrillation; Risk Factors:Dyslipidemia.  Sonographer:     Irving Burton Senior RDCS Referring Phys:  [id number] Lanier Prude Diagnosing Phys: Riley Lam MD   Sonographer Comments: 35mm Watchman FLX Device Implanted   PROCEDURE: After discussion of the risks and benefits of a TEE, an informed consent was obtained from the patient. The transesophogeal probe was passed without difficulty through the esophogus of the patient. Sedation performed by different physician. The patient was monitored while under deep sedation. The patient developed no complications during the procedure.  IMPRESSIONS   1. Prior to procedure, large left atrial appendage. Chicken wing appendage that tapers distally. Maximal dimension 2.95 cm. 2. A Mid posterior transeptal puncture was performed away from large PFO. 3. A 35 mm Watchman FLX was performed. No peri-device leak. There was a 1/3 to 1/2 shoulder. Average compression ~ 24%. 4. No change in trivial pericardial effusion. 5. Iatrogenic ASD with left to right shunting. 6. Left ventricular ejection fraction, by estimation, is 60 to 65%. The left ventricle has normal function. 7. Right ventricular systolic function is normal. The right ventricular size is normal. 8. Left atrial size was moderately dilated. No left atrial/left atrial  91.40 cm/s AV Vmean:          63.200 cm/s AV VTI:            0.173 m AV Peak Grad:      3.3 mmHg AV Mean Grad:      2.0 mmHg LVOT Vmax:         95.30 cm/s LVOT Vmean:        65.900 cm/s LVOT VTI:          0.176 m LVOT/AV VTI ratio: 1.02  AORTA Ao Root diam: 4.10 cm Ao Asc diam:  4.10 cm  MITRAL VALVE               TRICUSPID VALVE MV Area (PHT):             TR Peak grad:   20.8 mmHg MV Decel Time:             TR Vmax:        228.00 cm/s MV E velocity: 69.00 cm/s  Estimated RAP:  3.00 mmHg RVSP:           23.8 mmHg  SHUNTS Systemic VTI:  0.18 m Systemic Diam: 2.50 cm  Armanda Magic MD Electronically signed by Armanda Magic MD Signature Date/Time: 03/30/2022/12:48:24 PM    Final   TEE  ECHO TEE 07/21/2022  Narrative TRANSESOPHOGEAL ECHO REPORT    Patient Name:   Zachary Sanchez Date of Exam: 07/21/2022 Medical Rec #:  [id number]    Height:       74.0  in Accession #:    [id number]   Weight:       215.0 lb Date of Birth:  01/10/1946   BSA:          2.241 m Patient Age:    76 years     BP:           140/96 mmHg Patient Gender: M            HR:           59 bpm. Exam Location:  Inpatient  Procedure: Transesophageal Echo, 3D Echo, Color Doppler and Cardiac Doppler  Indications:     I48.2 Chronic atrial fibrillation  History:         Patient has prior history of Echocardiogram examinations, most recent 03/30/2022. Arrythmias:Atrial Fibrillation; Risk Factors:Dyslipidemia.  Sonographer:     Irving Burton Senior RDCS Referring Phys:  [id number] Lanier Prude Diagnosing Phys: Riley Lam MD   Sonographer Comments: 35mm Watchman FLX Device Implanted   PROCEDURE: After discussion of the risks and benefits of a TEE, an informed consent was obtained from the patient. The transesophogeal probe was passed without difficulty through the esophogus of the patient. Sedation performed by different physician. The patient was monitored while under deep sedation. The patient developed no complications during the procedure.  IMPRESSIONS   1. Prior to procedure, large left atrial appendage. Chicken wing appendage that tapers distally. Maximal dimension 2.95 cm. 2. A Mid posterior transeptal puncture was performed away from large PFO. 3. A 35 mm Watchman FLX was performed. No peri-device leak. There was a 1/3 to 1/2 shoulder. Average compression ~ 24%. 4. No change in trivial pericardial effusion. 5. Iatrogenic ASD with left to right shunting. 6. Left ventricular ejection fraction, by estimation, is 60 to 65%. The left ventricle has normal function. 7. Right ventricular systolic function is normal. The right ventricular size is normal. 8. Left atrial size was moderately dilated. No left atrial/left atrial  [facility]                                     Cardiology Office Note:    Date:  01/10/2023   ID:  Zachary Sanchez, DOB [birth year], MRN [id number]  PCP:  Mosetta Putt, MD  [facility] Cardiologist:  None  [facility] Electrophysiologist:  Lanier Prude, MD   Referring MD: Mosetta Putt, MD   No chief complaint on file. ***  History of Present Illness:    Zachary Sanchez is a 77 y.o. male with a hx of HLD, gout, prostate cancer, aortic root enlargement, DVT, permanent afib with severe nose bleeding on anticoagulation who is now s/p Watchman closure and is being seen for 1 month f/u.     Zachary Sanchez was previously followed by Dr. Johney Frame. On his last office visit 06/15/2021, EKG showed permanent atrial fibrillation at 53 bpm and he was asymptomatic. He was continued on Xarelto for prior DVT/PE. He then called the office 11/15/2021 and reported having several episodes of epistaxis. He had stopped Xarelto for 2 days until his bleeding resolved. He then saw Dr. Lalla Brothers for evaluation of LAAO closure with Watchman. Pre procedure showed anatomy suitable to proceed.     He is now s/p successful LAAO closure with 35mm Watchman FLX device. Plan was to continue Xarelto 20mg  PO daily for 45 days after implant (09/04/22). He will then stop the Xarelto and start Plavix 75mg  PO daily to complete 6 months (01/20/2023) of post implant therapy. Plan for CT scan 60 days after implant to assess Watchman position.    ***Today he reports that he has been doing well with no issues with chest pain, palpitations, LE edema, bleeding in stool or urine, dizziness, or syncope. He is beck to his normal exercise routine and is feeling great.     Permanent atrial fibrillation: s/p LAAO closure with Watchman FLX 35mm device. He was restarted on Xarelto 20mg  PO daily for 45 days after implant and will continue this through 09/04/22 then stop. He will then start Plavix 75mg  QD to complete 6 months of therapy  (01/20/23). Post LAAO CT instructions reviewed with understanding. Obtain BMET today. Plan 6 month f/u with our team.   He will require dental SBE through 01/20/2023 however has deferred cleanings until after 6 months.    HLD: Stable, continue statin    Prostate cancer: No change.     Past Medical History:  Diagnosis Date   Aortic root enlargement (HCC)    aortic root 40mm in size   Atrial flutter (HCC)    Biatrial enlargement    Colonic polyp    DJD (degenerative joint disease)    DVT (deep venous thrombosis) (HCC) 03/1016   post knee surgery, on eliquis   Dysrhythmia    a fib   Gout    Heart murmur    when younger   History of benign prostatic hypertrophy    Hyperlipidemia    Persistent atrial fibrillation (HCC)    longstanding persistent, asymptomatic   Pneumonia    Presence of Watchman left atrial appendage closure device 07/21/2022   35mm Watchman device by Dr. Lalla Brothers   Prostate cancer [facility])    Pulmonary emboli (HCC) 03/2016   from DVT post knee surgery   Rupture of quadriceps tendon 05/10/2017   Wears glasses     Past Surgical History:  Procedure Laterality  91.40 cm/s AV Vmean:          63.200 cm/s AV VTI:            0.173 m AV Peak Grad:      3.3 mmHg AV Mean Grad:      2.0 mmHg LVOT Vmax:         95.30 cm/s LVOT Vmean:        65.900 cm/s LVOT VTI:          0.176 m LVOT/AV VTI ratio: 1.02  AORTA Ao Root diam: 4.10 cm Ao Asc diam:  4.10 cm  MITRAL VALVE               TRICUSPID VALVE MV Area (PHT):             TR Peak grad:   20.8 mmHg MV Decel Time:             TR Vmax:        228.00 cm/s MV E velocity: 69.00 cm/s  Estimated RAP:  3.00 mmHg RVSP:           23.8 mmHg  SHUNTS Systemic VTI:  0.18 m Systemic Diam: 2.50 cm  Armanda Magic MD Electronically signed by Armanda Magic MD Signature Date/Time: 03/30/2022/12:48:24 PM    Final   TEE  ECHO TEE 07/21/2022  Narrative TRANSESOPHOGEAL ECHO REPORT    Patient Name:   Zachary Sanchez Date of Exam: 07/21/2022 Medical Rec #:  [id number]    Height:       74.0  in Accession #:    [id number]   Weight:       215.0 lb Date of Birth:  01/10/1946   BSA:          2.241 m Patient Age:    76 years     BP:           140/96 mmHg Patient Gender: M            HR:           59 bpm. Exam Location:  Inpatient  Procedure: Transesophageal Echo, 3D Echo, Color Doppler and Cardiac Doppler  Indications:     I48.2 Chronic atrial fibrillation  History:         Patient has prior history of Echocardiogram examinations, most recent 03/30/2022. Arrythmias:Atrial Fibrillation; Risk Factors:Dyslipidemia.  Sonographer:     Irving Burton Senior RDCS Referring Phys:  [id number] Lanier Prude Diagnosing Phys: Riley Lam MD   Sonographer Comments: 35mm Watchman FLX Device Implanted   PROCEDURE: After discussion of the risks and benefits of a TEE, an informed consent was obtained from the patient. The transesophogeal probe was passed without difficulty through the esophogus of the patient. Sedation performed by different physician. The patient was monitored while under deep sedation. The patient developed no complications during the procedure.  IMPRESSIONS   1. Prior to procedure, large left atrial appendage. Chicken wing appendage that tapers distally. Maximal dimension 2.95 cm. 2. A Mid posterior transeptal puncture was performed away from large PFO. 3. A 35 mm Watchman FLX was performed. No peri-device leak. There was a 1/3 to 1/2 shoulder. Average compression ~ 24%. 4. No change in trivial pericardial effusion. 5. Iatrogenic ASD with left to right shunting. 6. Left ventricular ejection fraction, by estimation, is 60 to 65%. The left ventricle has normal function. 7. Right ventricular systolic function is normal. The right ventricular size is normal. 8. Left atrial size was moderately dilated. No left atrial/left atrial  [facility]                                     Cardiology Office Note:    Date:  01/10/2023   ID:  Zachary Sanchez, DOB [birth year], MRN [id number]  PCP:  Mosetta Putt, MD  [facility] Cardiologist:  None  [facility] Electrophysiologist:  Lanier Prude, MD   Referring MD: Mosetta Putt, MD   No chief complaint on file. ***  History of Present Illness:    Zachary Sanchez is a 77 y.o. male with a hx of HLD, gout, prostate cancer, aortic root enlargement, DVT, permanent afib with severe nose bleeding on anticoagulation who is now s/p Watchman closure and is being seen for 1 month f/u.     Zachary Sanchez was previously followed by Dr. Johney Frame. On his last office visit 06/15/2021, EKG showed permanent atrial fibrillation at 53 bpm and he was asymptomatic. He was continued on Xarelto for prior DVT/PE. He then called the office 11/15/2021 and reported having several episodes of epistaxis. He had stopped Xarelto for 2 days until his bleeding resolved. He then saw Dr. Lalla Brothers for evaluation of LAAO closure with Watchman. Pre procedure showed anatomy suitable to proceed.     He is now s/p successful LAAO closure with 35mm Watchman FLX device. Plan was to continue Xarelto 20mg  PO daily for 45 days after implant (09/04/22). He will then stop the Xarelto and start Plavix 75mg  PO daily to complete 6 months (01/20/2023) of post implant therapy. Plan for CT scan 60 days after implant to assess Watchman position.    ***Today he reports that he has been doing well with no issues with chest pain, palpitations, LE edema, bleeding in stool or urine, dizziness, or syncope. He is beck to his normal exercise routine and is feeling great.     Permanent atrial fibrillation: s/p LAAO closure with Watchman FLX 35mm device. He was restarted on Xarelto 20mg  PO daily for 45 days after implant and will continue this through 09/04/22 then stop. He will then start Plavix 75mg  QD to complete 6 months of therapy  (01/20/23). Post LAAO CT instructions reviewed with understanding. Obtain BMET today. Plan 6 month f/u with our team.   He will require dental SBE through 01/20/2023 however has deferred cleanings until after 6 months.    HLD: Stable, continue statin    Prostate cancer: No change.     Past Medical History:  Diagnosis Date   Aortic root enlargement (HCC)    aortic root 40mm in size   Atrial flutter (HCC)    Biatrial enlargement    Colonic polyp    DJD (degenerative joint disease)    DVT (deep venous thrombosis) (HCC) 03/1016   post knee surgery, on eliquis   Dysrhythmia    a fib   Gout    Heart murmur    when younger   History of benign prostatic hypertrophy    Hyperlipidemia    Persistent atrial fibrillation (HCC)    longstanding persistent, asymptomatic   Pneumonia    Presence of Watchman left atrial appendage closure device 07/21/2022   35mm Watchman device by Dr. Lalla Brothers   Prostate cancer [facility])    Pulmonary emboli (HCC) 03/2016   from DVT post knee surgery   Rupture of quadriceps tendon 05/10/2017   Wears glasses     Past Surgical History:  Procedure Laterality

## 2023-01-11 ENCOUNTER — Ambulatory Visit: Payer: Medicare Other | Attending: Family Medicine

## 2023-01-11 ENCOUNTER — Other Ambulatory Visit: Payer: Self-pay | Admitting: Cardiology

## 2023-01-11 ENCOUNTER — Telehealth: Payer: Self-pay | Admitting: Cardiology

## 2023-01-11 NOTE — Telephone Encounter (Signed)
Patient initially scheduled for 6 month post Watchman follow up however missed his appointment today. Called to reach the patient and informed him that he can stop his Plavix after 01/20/2023. Given no CAD history, no plans to add ASA 81mg  daily at this time. Additionally he will no longer require dental SBE. Patient understands medication instructions and med list has been updated to reflect changes. Plan to touch base with the patient at the one year post LAAO mark. Otherwise continue to follow with primary cardiology team.    Georgie Chard NP-C Structural Heart Team  Pager: [phone] Phone: [phone]

## 2023-01-12 ENCOUNTER — Encounter: Payer: Self-pay | Admitting: Cardiology

## 2023-01-26 DIAGNOSIS — L570 Actinic keratosis: Secondary | ICD-10-CM | POA: Diagnosis not present

## 2023-01-26 DIAGNOSIS — D3611 Benign neoplasm of peripheral nerves and autonomic nervous system of face, head, and neck: Secondary | ICD-10-CM | POA: Diagnosis not present

## 2023-01-26 DIAGNOSIS — L814 Other melanin hyperpigmentation: Secondary | ICD-10-CM | POA: Diagnosis not present

## 2023-01-26 DIAGNOSIS — L578 Other skin changes due to chronic exposure to nonionizing radiation: Secondary | ICD-10-CM | POA: Diagnosis not present

## 2023-01-26 DIAGNOSIS — D485 Neoplasm of uncertain behavior of skin: Secondary | ICD-10-CM | POA: Diagnosis not present

## 2023-01-26 DIAGNOSIS — D1801 Hemangioma of skin and subcutaneous tissue: Secondary | ICD-10-CM | POA: Diagnosis not present

## 2023-01-26 DIAGNOSIS — L821 Other seborrheic keratosis: Secondary | ICD-10-CM | POA: Diagnosis not present

## 2023-02-01 NOTE — Progress Notes (Unsigned)
Zachary Sanchez    [id number]    [birth year]  Primary Care Physician:Blomgren, Theron Arista, MD  Referring Physician: Mosetta Putt, MD [address] [place],  [place] [address]   Chief complaint:  Chief Complaint  Patient presents with   Nausea    Intermittent for 3 years, Usually with extreme salivation then starts retching with bile coming up nothing in 2-3 months since hernia surgery, causes fatigue   Colonoscopy    Last colon was  2021 with tubular adenomatous with Dr. Dulce Sellar at [place]    HPI: Zachary Sanchez is a 77 y.o. male presenting to clinic today to establish care.   Today, he complains of intermittent nausea episodes that has persisted for the last 2-3 years. His last episode was about 2-3 months ago. His episode typically start with nausea than he would experience extreme salivation and retching that would typically last for about 2-3 minutes. He is also experiencing accompanying regurgitation of bile. He states that his symptoms started prior to his hernia repair surgery or his cholecystectomy. However, he reports that his symptoms are milder since his hernia repair.   He reports typically having one BM a day with somewhat loose stool, however, he does report experiencing intermittent constipation episodes that would last for 2-3 days at times. He is currently taking 6 capsules of a fiber supplements (can't recall the name) which he takes in the same timeframe. He was previously taking metamucil but states the fiber supplements are easier to take.   Patient denies any blood in stool, black stool, abdominal pain, bloating, unintentional weight loss, or dysphagia.  GI Hx:  CT Abdomen Pelvis w contrast 12-03-20 1. No acute findings identified within the abdomen or pelvis. 2. There is a rather elongated cecum which extends into the ventral pelvis, across the midline of the abdomen and into a large left inguinal hernia. There are no signs of bowel obstruction at  this time. 3. Suspect nonobstructing small bowel involving the ascending mesocolon. This contains nondilated loops of small bowel. 4. Small fat containing supraumbilical hernia is identified measuring 3 cm. 5. Nonobstructing left renal calculus. 6. Subjective hepatic steatosis. 7. Aortic atherosclerosis.  US Abdomen 05-19-20 1.  Suggestion of hepatic steatosis.  2.  Multiple small echogenic foci within the gallbladder the gallbladder wall which may represent cholelithiasis and/or gallbladder wall polyps, which do not require specific follow-up imaging per current ACR guidelines. No overt cholecystitis.  3.  Mildly complex cyst of the interpolar region of the right kidney.   EGD 06-19-20  Normal exam   Colonoscopy 07-08-19  [facility] report not available    Current Outpatient Medications:    allopurinol (ZYLOPRIM) 300 MG tablet, Take 300 mg by mouth daily., Disp: , Rfl:    cholecalciferol (VITAMIN D) 1000 units tablet, Take 2,000 Units by mouth daily., Disp: , Rfl:    ezetimibe (ZETIA) 10 MG tablet, Take 10 mg by mouth daily., Disp: , Rfl:    Glucosamine-Chondroitin (GLUCOSAMINE CHONDR COMPLEX PO), Take 2 tablets by mouth daily., Disp: , Rfl:    ibuprofen (ADVIL) 200 MG tablet, Take 400 mg by mouth every 6 (six) hours as needed for mild pain or headache., Disp: , Rfl:    rosuvastatin (CRESTOR) 40 MG tablet, Take 40 mg by mouth daily., Disp: , Rfl:    zolpidem (AMBIEN) 10 MG tablet, Take 5 mg by mouth at bedtime as needed for sleep., Disp: , Rfl:  Allergies as of 02/03/2023   (No Known Allergies)    Past Medical History:  Diagnosis Date   Aortic root enlargement (HCC)    aortic root 40mm in size   Atrial flutter (HCC)    Biatrial enlargement    Colonic polyp    DJD (degenerative joint disease)    DVT (deep venous thrombosis) (HCC) 03/1016   post knee surgery, on eliquis   Dysrhythmia    a fib   Gout    Heart murmur    when younger   History of benign prostatic  hypertrophy    Hyperlipidemia    Persistent atrial fibrillation (HCC)    longstanding persistent, asymptomatic   Pneumonia    Presence of Watchman left atrial appendage closure device 07/21/2022   35mm Watchman device by Dr. Lalla Brothers   Prostate cancer [facility])    Pulmonary emboli (HCC) 03/2016   from DVT post knee surgery   Rupture of quadriceps tendon 05/10/2017   Wears glasses     Past Surgical History:  Procedure Laterality Date   FRACTURE SURGERY     ankles and fingers   INGUINAL HERNIA REPAIR Left 12/16/2020   Procedure: LAPAROSCOPIC LEFT AND  RIGHT INGUINAL HERNIA REPAIR; TAP BLOCKS;  Surgeon: Karie Soda, MD;  Location: [facility];  Service: General;  Laterality: Left;  GEN AND LOCAL   KNEE SURGERY     right; menicus tear    LAPAROSCOPIC CHOLECYSTECTOMY SINGLE SITE WITH INTRAOPERATIVE CHOLANGIOGRAM N/A 09/04/2020   Procedure: LAPAROSCOPIC CHOLECYSTECTOMY SINGLE SITE WITH INTRAOPERATIVE CHOLANGIOGRAM BILATERAL TAP BLOCK;  Surgeon: Karie Soda, MD;  Location: [facility];  Service: General;  Laterality: N/A;   LEFT ATRIAL APPENDAGE OCCLUSION N/A 07/21/2022   Procedure: LEFT ATRIAL APPENDAGE OCCLUSION;  Surgeon: Lanier Prude, MD;  Location: [facility]VASIVE [facility];  Service: Cardiovascular;  Laterality: N/A;   LYMPHADENECTOMY Bilateral 07/09/2015   Procedure: PELVIC LYMPHADENECTOMY;  Surgeon: Heloise Purpura, MD;  Location: [facility];  Service: Urology;  Laterality: Bilateral;   NASAL ENDOSCOPY WITH EPISTAXIS CONTROL N/A 05/03/2022   Procedure: NASAL ENDOSCOPY WITH EPISTAXIS CONTROL;  Surgeon: Serena Colonel, MD;  Location: [facility] OR;  Service: ENT;  Laterality: N/A;   QUADRICEPS REPAIR     ROBOT ASSISTED LAPAROSCOPIC RADICAL PROSTATECTOMY N/A 07/09/2015   Procedure: XI ROBOTIC ASSISTED LAPAROSCOPIC RADICAL PROSTATECTOMY LEVEL 2;  Surgeon: Heloise Purpura, MD;  Location: [facility];  Service: Urology;  Laterality: N/A;   TEE WITHOUT CARDIOVERSION N/A 07/21/2022   Procedure: TRANSESOPHAGEAL ECHOCARDIOGRAM;   Surgeon: Lanier Prude, MD;  Location: [facility] INV[facility] CV LAB;  Service: Cardiovascular;  Laterality: N/A;   TONSILLECTOMY     TOTAL KNEE ARTHROPLASTY Right 03/04/2016   Procedure: RIGHT TOTAL KNEE ARTHROPLASTY;  Surgeon: Eugenia Mcalpine, MD;  Location: [facility];  Service: Orthopedics;  Laterality: Right;    Family History  Problem Relation Age of Onset   Macular degeneration Mother    Heart attack Father    Alcohol abuse Father        Heavy smoker and drinker   Cancer Brother        base of tongue mets to brain   Diabetes Brother    Obesity Brother     Social History   Socioeconomic History   Marital status: Married    Spouse name: Not on file   Number of children: 2   Years of education: Not on file   Highest education level: Not on file  Occupational History   Occupation: Merchant navy officer: [employer]  Tobacco Use   Smoking status: Former    Current packs/day: 0.00    Average packs/day: 1 pack/day for 20.0 years (20.0 ttl pk-yrs)    Types: Cigarettes    Start date: 02/11/1959    Quit date: 02/11/1979    Years since quitting: 44.0   Smokeless tobacco: Never  Vaping Use   Vaping status: Never Used  Substance and Sexual Activity   Alcohol use: Yes    Alcohol/week: 21.0 standard drinks of alcohol    Types: 21 Glasses of wine per week    Comment: 4 per day   Drug use: No   Sexual activity: Not Currently  Other Topics Concern   Not on file  Social History Narrative   Not on file   Social Determinants of Health   Financial Resource Strain: Not on file  Food Insecurity: Low Risk  (06/30/2022)   [facility]   Hunger Vital Sign    Worried About Running Out of Food in the Last Year: Never true    Ran Out of Food in the Last Year: Never true  Transportation Needs: Not on file (06/30/2022)  Physical Activity: Not on file  Stress: Not on file  Social Connections: Not on file  Intimate Partner Violence: Not on file      Review of systems: Review of Systems  Constitutional:  Negative for unexpected weight change.  HENT:  Negative for trouble swallowing.   Gastrointestinal:  Positive for constipation, diarrhea, nausea and vomiting. Negative for abdominal distention, abdominal pain, anal bleeding, blood in stool and rectal pain.       +regurgitation  +reflux      Physical Exam: Vitals:   02/03/23 1341  BP: 100/70  Pulse: 64   Body mass index is 28.76 kg/m.  General: well-appearing   Eyes: sclera anicteric, no redness ENT: oral mucosa moist without lesions, no cervical or supraclavicular lymphadenopathy CV: RRR, no JVD, no peripheral edema Resp: clear to auscultation bilaterally, normal RR and effort noted GI: soft, no tenderness, with active bowel sounds. No guarding or palpable organomegaly noted. Skin; warm and dry, no rash or jaundice noted Neuro: awake, alert and oriented x 3. Normal gross motor function and fluent speech   Data Reviewed:  Reviewed labs, radiology imaging, old records and pertinent past GI work up   Assessment and Plan/Recommendations: 77 year old very pleasant gentleman here with complaints of intermittent episodes of nausea with dry heaving and vomiting    Nausea and Retching Chronic episodes of nausea, heavy salivation, and retching for the past 2-3 years. Episodes have decreased in severity and frequency following gallbladder removal. Based on CT abdomen pelvis with contrast in August 2022, he has elongated cecum extending into the ventral pelvis and into the large left inguinal hernia, he is s/p repair of inguinal hernia with some improvement of the episodes. He did have chronic cholecystitis based on pathology report.  No recent episodes in the past 2-3 months.  Etiology is likely multifactorial etiology including acid reflux, post-surgical changes, scar tissue, and irregular bowel habits, IBS constipation. -Start Famotidine at bedtime and follow antireflux  measures. -Continue psyllium fiber capsules, split dose to 2 with each meal 3 times daily. -Ensure adequate hydration with 8-10 cups of water daily. -Use MiraLAX as needed for constipation. -Use Zofran sublingually as needed for acute nausea episodes. -Consider repeat endoscopy and colonoscopy if symptoms persist despite these changes.  Follow-up in 6 months or sooner if symptoms worsen.  The patient was provided an opportunity to ask questions and all were answered. The patient agreed with the plan and demonstrated an understanding of the instructions.  Iona Beard , MD  CC: Mosetta Putt, MD   Ladona Mow Hewitt Shorts as a scribe for Marsa Aris, MD.,have documented all relevant documentation on the behalf of Marsa Aris, MD,as directed by  Marsa Aris, MD while in the presence of Marsa Aris, MD.   I, Marsa Aris, MD, have reviewed all documentation for this visit. The documentation on 02/03/23 for the exam, diagnosis, procedures, and orders are all accurate and complete.

## 2023-02-03 ENCOUNTER — Encounter: Payer: Self-pay | Admitting: Gastroenterology

## 2023-02-03 ENCOUNTER — Ambulatory Visit: Payer: Medicare Other | Admitting: Gastroenterology

## 2023-02-03 VITALS — BP 100/70 | HR 64 | Ht 73.0 in | Wt 218.0 lb

## 2023-02-03 DIAGNOSIS — K582 Mixed irritable bowel syndrome: Secondary | ICD-10-CM | POA: Diagnosis not present

## 2023-02-03 DIAGNOSIS — R112 Nausea with vomiting, unspecified: Secondary | ICD-10-CM | POA: Diagnosis not present

## 2023-02-03 DIAGNOSIS — K219 Gastro-esophageal reflux disease without esophagitis: Secondary | ICD-10-CM | POA: Diagnosis not present

## 2023-02-03 DIAGNOSIS — Z8719 Personal history of other diseases of the digestive system: Secondary | ICD-10-CM

## 2023-02-03 DIAGNOSIS — Z9049 Acquired absence of other specified parts of digestive tract: Secondary | ICD-10-CM

## 2023-02-03 DIAGNOSIS — Z9889 Other specified postprocedural states: Secondary | ICD-10-CM

## 2023-02-03 MED ORDER — FAMOTIDINE 20 MG PO TABS: 20.0000 mg | ORAL_TABLET | Freq: Two times a day (BID) | ORAL | 3 refills | Status: DC

## 2023-02-03 MED ORDER — ONDANSETRON 4 MG PO TBDP: 4.0000 mg | ORAL_TABLET | Freq: Every day | ORAL | 0 refills | Status: DC

## 2023-02-03 NOTE — Patient Instructions (Addendum)
VISIT SUMMARY:  Dear Zachary Sanchez, during your recent visit, we discussed your ongoing issues with nausea, heavy salivation, and retching. We also touched on your history of loose bowel movements and your chronic, asymptomatic atrial fibrillation. We have made some changes to your treatment plan to better manage these conditions.  YOUR PLAN:  -NAUSEA AND RETCHING: You've been experiencing episodes of nausea, heavy salivation, and retching for the past 2-3 years. These episodes might be due to a combination of acid reflux, scar tissue from previous surgeries, and your bowel habits. We're adjusting your treatment plan to help manage these symptoms.    INSTRUCTIONS:  For your nausea and retching, start taking Famotidine at bedtime 4-5 times a week. Continue taking your [employer] brand fiber capsules, but split the dose to 2 with each meal. Make sure you're drinking 8-10 cups of water daily. Use MiraLAX as needed for constipation and Zofran under your tongue as needed for acute nausea episodes. If your symptoms persist despite these changes, we may consider a repeat endoscopy and colonoscopy. For your atrial fibrillation, continue with your current management plan. Please follow up in 6 months, or sooner if your symptoms worsen.  I appreciate the  opportunity to care for you  Thank You   Marsa Aris , MD

## 2023-02-06 ENCOUNTER — Encounter: Payer: Self-pay | Admitting: Gastroenterology

## 2023-02-07 ENCOUNTER — Encounter: Payer: Self-pay | Admitting: Gastroenterology

## 2023-03-20 DIAGNOSIS — L570 Actinic keratosis: Secondary | ICD-10-CM | POA: Diagnosis not present

## 2023-05-09 ENCOUNTER — Telehealth: Payer: Self-pay | Admitting: Gastroenterology

## 2023-05-09 NOTE — Telephone Encounter (Signed)
Inbound call from patient stating that he is having recurring symptoms like he was having before  with nausea and vomiting. Patient is requesting a call to discuss. Please advise.

## 2023-05-09 NOTE — Telephone Encounter (Signed)
Patient reports sudden return of his symptoms for the past 2 weeks. He has very little notice that he is going to vomit and therefore is not able to utilize Zofran. "My mouth will water and within 30 seconds, I am retching." He reports abdominal pain as well. No changes in diet or medications. Assures me he is following the recommendations from his last visit.

## 2023-05-09 NOTE — Telephone Encounter (Signed)
We are seeing increased number of patients with norovirus which triggers nausea and vomiting, please advise patient to take famotidine 20 mg twice daily for 2 weeks and then can decrease to as needed once his symptoms improve.  Use Zofran as soon as he gets nausea, okay to use it after he has an episode to prevent any further episodes that day.  Follow antireflux measures.  Please schedule urgent visit either with me or APP.  Thank you

## 2023-05-10 MED ORDER — FAMOTIDINE 20 MG PO TABS: 20.0000 mg | ORAL_TABLET | Freq: Two times a day (BID) | ORAL | 3 refills | Status: DC

## 2023-05-10 NOTE — Telephone Encounter (Signed)
Spoke with the patient about the plan of care. Agrees to take famotidine 20 mg BID. Declines Zofran. He explains to me that the vomiting occurs very quickly, will occur randomly once , and it does not occur more than once in a day. It does not occur every day. And it does not occur on consecutive days. He describes the fluid the last 2 times as being "purplish in color with particles. This is new. It was probably 4 to 5 ounces. This is new also." His normal is to bring up filmy bile color liquid, small amount and nothing solid. He hopes this information is helpful. He declines a follow up visit. He asks for any further testing that would be considered.  Discussed ongoing constipation issues. Reports when he becomes constipated he will use Miralax BID until he has an emptying bowel movement. This take about 3 days. He then does not take any Miralax until he is again constipated. Agreeable to taking daily Miralax if you think that will help.

## 2023-05-10 NOTE — Telephone Encounter (Signed)
Not sure going back on forth is appropriate, please encourage patient to schedule follow up visit. I don't want things to be lost in translation

## 2023-05-11 NOTE — Telephone Encounter (Signed)
Patient aware and accepts an appointment 05/17/23 at 8:30 am.

## 2023-05-17 ENCOUNTER — Other Ambulatory Visit: Payer: Self-pay | Admitting: Family Medicine

## 2023-05-17 ENCOUNTER — Ambulatory Visit: Payer: Medicare Other | Admitting: Gastroenterology

## 2023-05-17 ENCOUNTER — Ambulatory Visit
Admission: RE | Admit: 2023-05-17 | Discharge: 2023-05-17 | Disposition: A | Discharge status: Home / Self Care | Payer: Medicare Other | Source: Ambulatory Visit | Attending: Family Medicine | Admitting: Family Medicine

## 2023-05-17 ENCOUNTER — Encounter: Payer: Self-pay | Admitting: Gastroenterology

## 2023-05-17 VITALS — BP 100/70 | HR 64 | Ht 74.0 in | Wt 216.0 lb

## 2023-05-17 DIAGNOSIS — K581 Irritable bowel syndrome with constipation: Secondary | ICD-10-CM

## 2023-05-17 DIAGNOSIS — R5383 Other fatigue: Secondary | ICD-10-CM | POA: Diagnosis not present

## 2023-05-17 DIAGNOSIS — R059 Cough, unspecified: Secondary | ICD-10-CM

## 2023-05-17 DIAGNOSIS — R194 Change in bowel habit: Secondary | ICD-10-CM

## 2023-05-17 DIAGNOSIS — R110 Nausea: Secondary | ICD-10-CM

## 2023-05-17 DIAGNOSIS — R112 Nausea with vomiting, unspecified: Secondary | ICD-10-CM

## 2023-05-17 DIAGNOSIS — K2100 Gastro-esophageal reflux disease with esophagitis, without bleeding: Secondary | ICD-10-CM

## 2023-05-17 DIAGNOSIS — K219 Gastro-esophageal reflux disease without esophagitis: Secondary | ICD-10-CM

## 2023-05-17 MED ORDER — ONDANSETRON 4 MG PO TBDP: 4.0000 mg | ORAL_TABLET | Freq: Every day | ORAL | 0 refills | Status: DC

## 2023-05-17 MED ORDER — SUCRALFATE 1 G PO TABS: 1.0000 g | ORAL_TABLET | Freq: Three times a day (TID) | ORAL | 0 refills | Status: DC

## 2023-05-17 MED ORDER — PANTOPRAZOLE SODIUM 40 MG PO TBEC: 40.0000 mg | DELAYED_RELEASE_TABLET | Freq: Two times a day (BID) | ORAL | 0 refills | Status: DC

## 2023-05-17 NOTE — Progress Notes (Signed)
0 Zachary Sanchez    [id number]    1945-09-12  Primary Care Physician:Blomgren, Theron Arista, MD  Referring Physician: Mosetta Putt, MD [address] [place],  [place] [address]   Chief complaint:  Nausea  D0iscussed the use of AI scribe software for clinical note transcription with the patient, who gave verbal consent to proceed.  History of Present Illness   78 year old very pleasant gentleman here with  worsening nausea and gastrointestinal symptoms.  He has been experiencing worsening nausea and gastrointestinal symptoms over the past two weeks, with notable episodes on January 8th, 12th, and 15th. These episodes have increased in frequency and severity, involving retching and expulsion of purplish matter with food particles, which he attributes to wine consumption. Prior to these episodes, nausea was infrequent. No dark stools or blood in stool. He notes a decreased appetite over the past few days and has been drinking a lot of water.  He reports constipation and diarrhea, with stools being small and pellet-like. He is currently taking psyllium husk as needed and uses MiraLAX occasionally. Despite drinking plenty of water, he has noticed a decreased appetite recently.  He has had a persistent cough for the past two weeks, which he suspects might be related to pneumonia. The cough is described as bad, accompanied by weakness and lethargy. He is scheduled for a chest x-ray. The cough medicine provided did not offer significant relief. No fever has been experienced despite the respiratory symptoms.  He has been taking Pepcid twice daily since the third episode of nausea, which has helped reduce the severity of his symptoms. He experiences stomach pain during episodes of retching, described as debilitating and exhausting.       GI Hx:  CT Abdomen Pelvis w contrast 12-03-20 1. No acute findings identified within the abdomen or pelvis. 2. There is a rather elongated cecum which  extends into the ventral pelvis, across the midline of the abdomen and into a large left inguinal hernia. There are no signs of bowel obstruction at this time. 3. Suspect nonobstructing small bowel involving the ascending mesocolon. This contains nondilated loops of small bowel. 4. Small fat containing supraumbilical hernia is identified measuring 3 cm. 5. Nonobstructing left renal calculus. 6. Subjective hepatic steatosis. 7. Aortic atherosclerosis.   US Abdomen 05-19-20 1.  Suggestion of hepatic steatosis.  2.  Multiple small echogenic foci within the gallbladder the gallbladder wall which may represent cholelithiasis and/or gallbladder wall polyps, which do not require specific follow-up imaging per current ACR guidelines. No overt cholecystitis.  3.  Mildly complex cyst of the interpolar region of the right kidney.    EGD 06-19-20  Normal exam    Colonoscopy 07-08-19  [facility] not [facility]    Outpatient Encounter Medications as of 05/17/2023  Medication Sig   allopurinol (ZYLOPRIM) 300 MG tablet Take 300 mg by mouth daily.   cholecalciferol (VITAMIN D) 1000 units tablet Take 2,000 Units by mouth daily.   ezetimibe (ZETIA) 10 MG tablet Take 10 mg by mouth daily.   famotidine (PEPCID) 20 MG tablet Take 1 tablet (20 mg total) by mouth 2 (two) times daily.   Glucosamine-Chondroitin (GLUCOSAMINE CHONDR COMPLEX PO) Take 2 tablets by mouth daily.   ibuprofen (ADVIL) 200 MG tablet Take 400 mg by mouth every 6 (six) hours as needed for mild pain or headache.   pantoprazole (PROTONIX) 40 MG tablet Take 1 tablet (40 mg total) by mouth 2 (two) times daily.  rosuvastatin (CRESTOR) 40 MG tablet Take 40 mg by mouth daily.   sucralfate (CARAFATE) 1 g tablet Take 1 tablet (1 g total) by mouth in the morning, at noon, and at bedtime.   zolpidem (AMBIEN) 10 MG tablet Take 5 mg by mouth at bedtime as needed for sleep.   ondansetron (ZOFRAN-ODT) 4 MG disintegrating tablet Take 1 tablet (4 mg  total) by mouth daily.   [DISCONTINUED] ondansetron (ZOFRAN-ODT) 4 MG disintegrating tablet Take 1 tablet (4 mg total) by mouth daily.   No facility-administered encounter medications on file as of 05/17/2023.    Allergies as of 05/17/2023   (No Known Allergies)    Past Medical History:  Diagnosis Date   Aortic root enlargement (HCC)    aortic root 40mm in size   Atrial flutter (HCC)    Biatrial enlargement    Colonic polyp    DJD (degenerative joint disease)    DVT (deep venous thrombosis) (HCC) 03/1016   post knee surgery, on eliquis   Dysrhythmia    a fib   Gout    Heart murmur    when younger   History of benign prostatic hypertrophy    Hyperlipidemia    Persistent atrial fibrillation (HCC)    longstanding persistent, asymptomatic   Pneumonia    Presence of Watchman left atrial appendage closure device 07/21/2022   35mm Watchman device by Dr. Lalla Brothers   Prostate cancer [facility])    Pulmonary emboli (HCC) 03/2016   from DVT post knee surgery   Rupture of quadriceps tendon 05/10/2017   Wears glasses     Past Surgical History:  Procedure Laterality Date   FRACTURE SURGERY     ankles and fingers   INGUINAL HERNIA REPAIR Left 12/16/2020   Procedure: LAPAROSCOPIC LEFT AND  RIGHT INGUINAL HERNIA REPAIR; TAP BLOCKS;  Surgeon: Karie Soda, MD;  Location: [facility];  Service: General;  Laterality: Left;  GEN AND LOCAL   KNEE SURGERY     right; menicus tear    LAPAROSCOPIC CHOLECYSTECTOMY SINGLE SITE WITH INTRAOPERATIVE CHOLANGIOGRAM N/A 09/04/2020   Procedure: LAPAROSCOPIC CHOLECYSTECTOMY SINGLE SITE WITH INTRAOPERATIVE CHOLANGIOGRAM BILATERAL TAP BLOCK;  Surgeon: Karie Soda, MD;  Location: [facility];  Service: General;  Laterality: N/A;   LEFT ATRIAL APPENDAGE OCCLUSION N/A 07/21/2022   Procedure: LEFT ATRIAL APPENDAGE OCCLUSION;  Surgeon: Lanier Prude, MD;  [facility]: [facility];  Service: Cardiovascular;  Laterality: N/A;   LYMPHADENECTOMY Bilateral 07/09/2015    Procedure: PELVIC LYMPHADENECTOMY;  Surgeon: Heloise Purpura, MD;  Location: [facility];  Service: Urology;  Laterality: Bilateral;   NASAL ENDOSCOPY WITH EPISTAXIS CONTROL N/A 05/03/2022   Procedure: NASAL ENDOSCOPY WITH EPISTAXIS CONTROL;  Surgeon: Serena Colonel, MD;  [facility];  Service: ENT;  Laterality: N/A;   QUADRICEPS REPAIR     ROBOT ASSISTED LAPAROSCOPIC RADICAL PROSTATECTOMY N/A 07/09/2015   Procedure: XI ROBOTIC ASSISTED LAPAROSCOPIC RADICAL PROSTATECTOMY LEVEL 2;  Surgeon: Heloise Purpura, MD;  Location: [facility];  Service: Urology;  Laterality: N/A;   TEE WITHOUT CARDIOVERSION N/A 07/21/2022   Procedure: TRANSESOPHAGEAL ECHOCARDIOGRAM;  Surgeon: Lanier Prude, MD;  Location: [facility] INVASIVE CV LAB;  Service: Cardiovascular;  Laterality: N/A;   TONSILLECTOMY     TOTAL KNEE ARTHROPLASTY Right 03/04/2016   Procedure: RIGHT TOTAL KNEE ARTHROPLASTY;  Surgeon: Eugenia Mcalpine, MD;  Location: [facility];  Service: Orthopedics;  Laterality: Right;    Family History  Problem Relation Age of Onset   Macular degeneration Mother    Heart attack Father  Alcohol abuse Father        Heavy smoker and drinker   Cancer Brother        base of tongue mets to brain   Diabetes Brother    Obesity Brother     Social History   Socioeconomic History   Marital status: Married    Spouse name: Not on file   Number of children: 2   Years of education: Not on file   Highest education level: Not on file  Occupational History   Occupation: Merchant navy officer: [employer]  Tobacco Use   Smoking status: Former    Current packs/day: 0.00    Average packs/day: 1 pack/day for 20.0 years (20.0 ttl pk-yrs)    Types: Cigarettes    Start date: 02/11/1959    Quit date: 02/11/1979    Years since quitting: 44.2   Smokeless tobacco: Never  Vaping Use   Vaping status: Never Used  Substance and Sexual Activity   Alcohol use: Yes    Alcohol/week: 21.0 standard drinks of alcohol    Types: 21  Glasses of wine per week    Comment: 4 per day   Drug use: No   Sexual activity: Not Currently  Other Topics Concern   Not on file  Social History Narrative   Not on file   Social Drivers of Health   Financial Resource Strain: Not on file  Food Insecurity: Low Risk  (06/30/2022)   [facility]   Hunger Vital Sign    Worried About Running Out of Food in the Last Year: Never true    Ran Out of Food in the Last Year: Never true  Transportation Needs: Not on file (06/30/2022)  Physical Activity: Not on file  Stress: Not on file  Social Connections: Not on file  Intimate Partner Violence: Not on file      Review of systems: All other review of systems negative except as mentioned in the HPI.   Physical Exam: Vitals:   05/17/23 0834  BP: 100/70  Pulse: 64   Body mass index is 27.73 kg/m. Gen:      No acute distress HEENT:  sclera anicteric CV: s1s2 rrr, no murmur Lungs: B/l clear. Abd:      soft, non-tender; no palpable masses, no distension Ext:    No edema Neuro: alert and oriented x 3 Psych: normal mood and affect  Data Reviewed:  Reviewed labs, radiology imaging, old records and pertinent past GI work up     Assessment and Plan   78 year old very pleasant gentleman here with complaints of intermittent episodes of nausea with dry heaving and vomiting   Possible Pneumonia Persistent cough and lethargy for two weeks. Chest X-ray scheduled with Dr. Duaine Dredge. Decreased breath sounds on the left side, upper lobe. No fever reported. -Continue with planned chest X-ray. -Consider over-the-counter guaifenesin to help break down mucus.  Gastroesophageal Reflux Disease (GERD) Increased frequency of nausea and retching episodes in the past two weeks. Episodes associated with coughing and possible pneumonia/bronchitis. Previous use of Pepcid. -Replace Pepcid with pantoprazole (Protonix) 40 mg twice daily before breakfast and dinner for two  months. -Add Carafate suspension before each meal for one week. -Consider endoscopy if symptoms do not improve within the next few weeks.  Constipation/Diarrhea Inconsistent bowel habits reported. Currently taking psyllium husk as needed. -Increase psyllium husk to twice daily. -Add MiraLAX half a capful daily in the morning, with an extra dose in  the evening if needed.  Nausea Increased frequency of episodes in the past two weeks. No anti-nausea medication currently in use. -Prescribe Zofran dissolvable tablets for use as needed.  Follow-up in 1-2 months to assess symptom improvement. If symptoms do not improve, consider endoscopy.          This visit required 40  minutes of patient care (this includes precharting, chart review, review of results, face-to-face time used for counseling as well as treatment plan and follow-up. The patient was provided an opportunity to ask questions and all were answered. The patient agreed with the plan and demonstrated an understanding of the instructions.  Iona Beard , MD    CC: Mosetta Putt, MD

## 2023-05-17 NOTE — Patient Instructions (Addendum)
VISIT SUMMARY:  During today's visit, we discussed your worsening nausea and gastrointestinal symptoms, persistent cough, and inconsistent bowel habits. We have made some changes to your medications and have scheduled further tests to better understand your condition.  YOUR PLAN:  -POSSIBLE PNEUMONIA: Pneumonia is an infection that inflames the air sacs in one or both lungs. You have been experiencing a persistent cough and lethargy for two weeks. We have scheduled a chest X-ray to investigate further. In the meantime, you can take over-the-counter guaifenesin to help break down mucus.  -GASTROESOPHAGEAL REFLUX DISEASE (GERD): GERD is a digestive disorder that affects the lower esophageal sphincter, leading to acid reflux and heartburn. You have had increased nausea and retching episodes. We are replacing Pepcid with pantoprazole (Protonix) to be taken twice daily before breakfast and dinner for two months. Additionally, you should take Carafate suspension before each meal for one week. If your symptoms do not improve, we may consider an endoscopy.  -CONSTIPATION/DIARRHEA: You have reported inconsistent bowel habits, including constipation and diarrhea. We recommend increasing your intake of psyllium husk to twice daily and adding MiraLAX, half a capful daily in the morning, with an extra dose in the evening if needed.  -NAUSEA: You have experienced increased episodes of nausea over the past two weeks. We are prescribing Zofran dissolvable tablets for you to use as needed to help manage these episodes.  INSTRUCTIONS:  Please follow up in 1-2 months to assess symptom improvement. If your symptoms do not improve, we may consider an endoscopy. Continue with the planned chest X-ray and take over-the-counter guaifenesin to help with your cough.  I appreciate the  opportunity to care for you  Thank You   Zachary Sanchez , MD

## 2023-07-19 DIAGNOSIS — M109 Gout, unspecified: Secondary | ICD-10-CM | POA: Diagnosis not present

## 2023-07-19 DIAGNOSIS — Z8546 Personal history of malignant neoplasm of prostate: Secondary | ICD-10-CM | POA: Diagnosis not present

## 2023-08-09 ENCOUNTER — Other Ambulatory Visit: Payer: Self-pay | Admitting: Gastroenterology

## 2023-08-18 ENCOUNTER — Encounter: Payer: Self-pay | Admitting: Gastroenterology

## 2023-08-18 ENCOUNTER — Ambulatory Visit: Payer: Medicare Other | Admitting: Gastroenterology

## 2023-08-18 VITALS — BP 104/62 | HR 67 | Ht 74.0 in | Wt 220.0 lb

## 2023-08-18 DIAGNOSIS — K5900 Constipation, unspecified: Secondary | ICD-10-CM | POA: Diagnosis not present

## 2023-08-18 DIAGNOSIS — K219 Gastro-esophageal reflux disease without esophagitis: Secondary | ICD-10-CM | POA: Diagnosis not present

## 2023-08-18 DIAGNOSIS — K2100 Gastro-esophageal reflux disease with esophagitis, without bleeding: Secondary | ICD-10-CM

## 2023-08-18 DIAGNOSIS — K5904 Chronic idiopathic constipation: Secondary | ICD-10-CM

## 2023-08-18 MED ORDER — FAMOTIDINE 20 MG PO TABS: 20.0000 mg | ORAL_TABLET | Freq: Every day | ORAL | 3 refills | Status: AC

## 2023-08-18 MED ORDER — PANTOPRAZOLE SODIUM 40 MG PO TBEC: 40.0000 mg | DELAYED_RELEASE_TABLET | Freq: Two times a day (BID) | ORAL | 3 refills | Status: AC

## 2023-08-18 NOTE — Progress Notes (Signed)
 Zachary Sanchez    [id number]    22-Jan-1946  Primary Care Physician:Blomgren, Donata Fryer, MD  Referring Physician: Candise Chambers, MD [address] [place],  [place] [address]   Chief complaint:  Constipation, GERD  Discussed the use of AI scribe software for clinical note transcription with the patient, who gave verbal consent to proceed.  History of Present Illness Zachary Sanchez is a 78 year old male who presents for follow-up of gastrointestinal symptoms.  He has been experiencing symptoms of gastroesophageal reflux disease (GERD), including nausea and retching, which have improved with the use of pantoprazole . Since starting the medication, he has not experienced any episodes of nausea or retching. Initially, he was taking pantoprazole  twice daily but has reduced the dose to once daily. He has not been taking any additional evening doses of Pepcid , which he used prior to pantoprazole . No chest pain, burning sensation, or discomfort is reported.  He continues to experience issues with constipation. He has been using a combination of psyllium and MiraLAX  but recently switched to Fibercon, taking one capsule daily. No stomach pain, blood in stool, or mucus. Occasional bloating is noted, which he attributes to gas or air.  GI Hx:  CT Abdomen Pelvis w contrast 12-03-20 1. No acute findings identified within the abdomen or pelvis. 2. There is a rather elongated cecum which extends into the ventral pelvis, across the midline of the abdomen and into a large left inguinal hernia. There are no signs of bowel obstruction at this time. 3. Suspect nonobstructing small bowel involving the ascending mesocolon. This contains nondilated loops of small bowel. 4. Small fat containing supraumbilical hernia is identified measuring 3 cm. 5. Nonobstructing left renal calculus. 6. Subjective hepatic steatosis. 7. Aortic atherosclerosis.   US  Abdomen 05-19-20 1.  Suggestion of hepatic  steatosis.  2.  Multiple small echogenic foci within the gallbladder the gallbladder wall which may represent cholelithiasis and/or gallbladder wall polyps, which do not require specific follow-up imaging per current ACR guidelines. No overt cholecystitis.  3.  Mildly complex cyst of the interpolar region of the right kidney.    EGD in 2017 for duodenal ulcer and duodenitis, H. pylori positive was treated with Pylera .  H. pylori was eradicated Subsequent EGD 06-19-20:Normal exam    Colonoscopy 07-08-19: History of colon polyps Report not available in epic, was performed by Dr. Andriette Keeling, no recall due to age and no polyps were found or removed during that exam.      Outpatient Encounter Medications as of 08/18/2023  Medication Sig   allopurinol  (ZYLOPRIM ) 300 MG tablet Take 300 mg by mouth daily.   cholecalciferol (VITAMIN D ) 1000 units tablet Take 2,000 Units by mouth daily.   ezetimibe (ZETIA) 10 MG tablet Take 10 mg by mouth daily.   Glucosamine-Chondroitin (GLUCOSAMINE CHONDR COMPLEX PO) Take 2 tablets by mouth daily.   pantoprazole  (PROTONIX ) 40 MG tablet Take 1 tablet by mouth twice daily   polycarbophil (FIBERCON) 625 MG tablet Take 625 mg by mouth daily.   rosuvastatin  (CRESTOR ) 40 MG tablet Take 40 mg by mouth daily.   zolpidem  (AMBIEN ) 10 MG tablet Take 5 mg by mouth at bedtime as needed for sleep.   [DISCONTINUED] famotidine  (PEPCID ) 20 MG tablet Take 1 tablet (20 mg total) by mouth 2 (two) times daily.   [DISCONTINUED] ibuprofen (ADVIL) 200 MG tablet Take 400 mg by mouth every 6 (six) hours as needed for mild pain or headache.   [  DISCONTINUED] ondansetron  (ZOFRAN -ODT) 4 MG disintegrating tablet Take 1 tablet (4 mg total) by mouth daily.   [DISCONTINUED] sucralfate  (CARAFATE ) 1 g tablet Take 1 tablet (1 g total) by mouth in the morning, at noon, and at bedtime.   No facility-administered encounter medications on file as of 08/18/2023.    Allergies as of 08/18/2023   (No Known  Allergies)    Past Medical History:  Diagnosis Date   Aortic root enlargement (HCC)    aortic root 40mm in size   Atrial flutter (HCC)    Biatrial enlargement    Colonic polyp    DJD (degenerative joint disease)    DVT (deep venous thrombosis) (HCC) 03/1016   post knee surgery, on eliquis    Dysrhythmia    a fib   Gout    Heart murmur    when younger   History of benign prostatic hypertrophy    Hyperlipidemia    Persistent atrial fibrillation (HCC)    longstanding persistent, asymptomatic   Pneumonia    Presence of Watchman left atrial appendage closure device 07/21/2022   35mm Watchman device by Dr. Marven Slimmer   Prostate cancer [facility])    Pulmonary emboli (HCC) 03/2016   from DVT post knee surgery   Rupture of quadriceps tendon 05/10/2017   Wears glasses     Past Surgical History:  Procedure Laterality Date   FRACTURE SURGERY     ankles and fingers   INGUINAL HERNIA REPAIR Left 12/16/2020   Procedure: LAPAROSCOPIC LEFT AND  RIGHT INGUINAL HERNIA REPAIR; TAP BLOCKS;  Surgeon: Candyce Champagne, MD;  Location: [facility];  Service: General;  Laterality: Left;  GEN AND LOCAL   KNEE SURGERY     right; menicus tear    LAPAROSCOPIC CHOLECYSTECTOMY SINGLE SITE WITH INTRAOPERATIVE CHOLANGIOGRAM N/A 09/04/2020   Procedure: LAPAROSCOPIC CHOLECYSTECTOMY SINGLE SITE WITH INTRAOPERATIVE CHOLANGIOGRAM BILATERAL TAP BLOCK;  Surgeon: Candyce Champagne, MD;  Location: [facility];  Service: General;  Laterality: N/A;   LEFT ATRIAL APPENDAGE OCCLUSION N/A 07/21/2022   Procedure: LEFT ATRIAL APPENDAGE OCCLUSION;  Surgeon: Boyce Byes, MD;  [facility]: [facility];  Service: Cardiovascular;  Laterality: N/A;   LYMPHADENECTOMY Bilateral 07/09/2015   Procedure: PELVIC LYMPHADENECTOMY;  Surgeon: Florencio Hunting, MD;  Location: [facility];  Service: Urology;  Laterality: Bilateral;   NASAL ENDOSCOPY WITH EPISTAXIS CONTROL N/A 05/03/2022   Procedure: NASAL ENDOSCOPY WITH EPISTAXIS CONTROL;  Surgeon: Janita Mellow,  MD;  Location: [facility];  Service: ENT;  Laterality: N/A;   QUADRICEPS REPAIR     ROBOT ASSISTED LAPAROSCOPIC RADICAL PROSTATECTOMY N/A 07/09/2015   Procedure: XI ROBOTIC ASSISTED LAPAROSCOPIC RADICAL PROSTATECTOMY LEVEL 2;  Surgeon: Florencio Hunting, MD;  [facility];  Service: Urology;  Laterality: N/A;   TEE WITHOUT CARDIOVERSION N/A 07/21/2022   Procedure: TRANSESOPHAGEAL ECHOCARDIOGRAM;  Surgeon: Boyce Byes, MD;  Location: [facility] INVASIVE CV LAB;  Service: Cardiovascular;  Laterality: N/A;   TONSILLECTOMY     TOTAL KNEE ARTHROPLASTY Right 03/04/2016   Procedure: RIGHT TOTAL KNEE ARTHROPLASTY;  Surgeon: Genevie Kerns, MD;  [facility]: [facility];  Service: Orthopedics;  Laterality: Right;    Family History  Problem Relation Age of Onset   Macular degeneration Mother    Heart attack Father    Alcohol abuse Father        Heavy smoker and drinker   Cancer Brother        base of tongue mets to brain   Diabetes Brother    Obesity Brother  Social History   Socioeconomic History   Marital status: Married    Spouse name: Not on file   Number of children: 2   Years of education: Not on file   Highest education level: Not on file  Occupational History   Occupation: Biomedical engineer    Employer: [employer]  Tobacco Use   Smoking status: Former    Current packs/day: 0.00    Average packs/day: 1 pack/day for 20.0 years (20.0 ttl pk-yrs)    Types: Cigarettes    Start date: 02/11/1959    Quit date: 02/11/1979    Years since quitting: 44.5   Smokeless tobacco: Never  Vaping Use   Vaping status: Never Used  Substance and Sexual Activity   Alcohol use: Yes    Alcohol/week: 21.0 standard drinks of alcohol    Types: 21 Glasses of wine per week    Comment: 4 per day   Drug use: No   Sexual activity: Not Currently  Other Topics Concern   Not on file  Social History Narrative   Not on file   Social Drivers of Health   Financial Resource Strain: Not on file  Food  Insecurity: Low Risk  (06/30/2022)   [facility]   Hunger Vital Sign    Worried About Running Out of Food in the Last Year: Never true    Ran Out of Food in the Last Year: Never true  Transportation Needs: Not on file (06/30/2022)  Physical Activity: Not on file  Stress: Not on file  Social Connections: Not on file  Intimate Partner Violence: Not on file      Review of systems: All other review of systems negative except as mentioned in the HPI.   Physical Exam: Vitals:   08/18/23 0835  BP: 104/62  Pulse: 67   Body mass index is 28.25 kg/m. Gen:      No acute distress HEENT:  sclera anicteric Abd:      soft, non-tender; no palpable masses, no distension Ext:    No edema Neuro: alert and oriented x 3 Psych: normal mood and affect  Data Reviewed:  Reviewed labs, radiology imaging, old records and pertinent past GI work up     Assessment and Plan Assessment & Plan Gastroesophageal reflux disease (GERD) GERD symptoms have improved with pantoprazole . No episodes of retching, nausea, chest pain, or burning sensation. Emphasized reducing medication use to minimize long-term side effects. - Decrease pantoprazole  to 40 mg daily. - Use Pepcid  20 mg in the evening as needed, especially if consuming alcohol or a heavy meal. - Consider further reducing pantoprazole  to 20 mg daily if symptoms remain controlled in 6 months.  Constipation Constipation persists despite current regimen. Current use of Fibercon may contribute to constipation. Recommended switching to psyllium husk or Benefiber for better gut health and prebiotic benefits. Encouraged dietary fiber intake of 20-30 grams daily in divided doses. Discussed the use of Colace as needed for stool softening. Emphasized the importance of hydration and spreading fiber intake throughout the day. - Switch from Fibercon to psyllium husk or Benefiber. - Increase dietary fiber intake to 20-30 grams daily  in divided doses. - Use Colace as needed for stool softening.   This visit required 30 minutes of patient care (this includes precharting, chart review, review of results, face-to-face time used for counseling as well as treatment plan and follow-up. The patient was provided an opportunity to ask questions and all were answered. The patient agreed  with the plan and demonstrated an understanding of the instructions.  Lorena Rolling , MD    CC: Candise Chambers, MD

## 2023-08-18 NOTE — Patient Instructions (Addendum)
 VISIT SUMMARY:  You came in today for a follow-up on your gastrointestinal symptoms. Your symptoms of gastroesophageal reflux disease (GERD) have improved with the use of pantoprazole , and you have not experienced any episodes of nausea or retching since starting the medication. You also discussed ongoing issues with constipation and made some changes to your treatment plan.  YOUR PLAN:  -GASTROESOPHAGEAL REFLUX DISEASE (GERD): GERD is a condition where stomach acid frequently flows back into the tube connecting your mouth and stomach, causing irritation. Your symptoms have improved with pantoprazole . You should now decrease your pantoprazole  dose to 40 mg daily and use Pepcid  20mg  in the evening as needed, especially if you consume alcohol or a heavy meal. If your symptoms remain controlled, you may consider further reducing pantoprazole  to 20 mg daily in 6 months.  -CONSTIPATION: Constipation is when you have infrequent or difficult bowel movements. Your current use of Fibercon may be contributing to your constipation. You should switch to psyllium husk or Benefiber for better gut health and prebiotic benefits. Aim to increase your dietary fiber intake to 20-30 grams daily in divided doses. You can use Colace as needed for stool softening. Remember to stay hydrated and spread your fiber intake throughout the day.  INSTRUCTIONS:  Please follow up as needed if your symptoms do not improve or if you have any concerns.  We have sent the following medications to your pharmacy for you to pick up at your convenience: Pantoprazole  Pepcid   Due to recent changes in healthcare laws, you may see the results of your imaging and laboratory studies on MyChart before your provider has had a chance to review them.  We understand that in some cases there may be results that are confusing or concerning to you. Not all laboratory results come back in the same time frame and the provider may be waiting for multiple  results in order to interpret others.  Please give us  48 hours in order for your provider to thoroughly review all the results before contacting the office for clarification of your results.    I appreciate the  opportunity to care for you  Thank You   Kavitha Nandigam , MD

## 2023-08-30 ENCOUNTER — Other Ambulatory Visit (HOSPITAL_COMMUNITY): Payer: Self-pay | Admitting: Family Medicine

## 2023-08-30 ENCOUNTER — Encounter (HOSPITAL_COMMUNITY): Payer: Self-pay | Admitting: Family Medicine

## 2023-08-30 DIAGNOSIS — I7121 Aneurysm of the ascending aorta, without rupture: Secondary | ICD-10-CM

## 2023-08-31 DIAGNOSIS — Z01812 Encounter for preprocedural laboratory examination: Secondary | ICD-10-CM | POA: Diagnosis not present

## 2023-09-05 ENCOUNTER — Telehealth: Payer: Self-pay

## 2023-09-05 NOTE — Telephone Encounter (Signed)
 Called to check on patient, who had LAAO 07/21/2022.   Left message to call back.

## 2023-09-06 ENCOUNTER — Ambulatory Visit (HOSPITAL_COMMUNITY)
Admission: RE | Admit: 2023-09-06 | Discharge: 2023-09-06 | Disposition: A | Discharge status: Home / Self Care | Source: Ambulatory Visit | Attending: Family Medicine | Admitting: Family Medicine

## 2023-09-06 DIAGNOSIS — I728 Aneurysm of other specified arteries: Secondary | ICD-10-CM | POA: Diagnosis not present

## 2023-09-06 DIAGNOSIS — I7121 Aneurysm of the ascending aorta, without rupture: Secondary | ICD-10-CM | POA: Insufficient documentation

## 2023-09-06 DIAGNOSIS — I517 Cardiomegaly: Secondary | ICD-10-CM | POA: Diagnosis not present

## 2023-09-06 MED ORDER — SODIUM CHLORIDE (PF) 0.9 % IJ SOLN
INTRAMUSCULAR | Status: AC
  Filled 2023-09-06: qty 50

## 2023-09-06 MED ORDER — IOHEXOL 350 MG/ML SOLN
75.0000 mL | Freq: Once | INTRAVENOUS | Status: AC | PRN
  Administered 2023-09-06: 75 mL via INTRAVENOUS

## 2023-09-07 NOTE — Telephone Encounter (Signed)
 Called to check in with patient, who had LAAO on 07/21/2023. The patient reports doing well with no issues.  The patient understands to call with questions or concerns.   Offered to schedule follow-up appointment, but he declined. He will call if he needs anything.

## 2023-09-07 NOTE — Telephone Encounter (Signed)
 Left message to call back

## 2023-09-20 DIAGNOSIS — H25813 Combined forms of age-related cataract, bilateral: Secondary | ICD-10-CM | POA: Diagnosis not present

## 2023-10-03 DIAGNOSIS — Z4789 Encounter for other orthopedic aftercare: Secondary | ICD-10-CM | POA: Diagnosis not present

## 2023-11-08 DIAGNOSIS — M2351 Chronic instability of knee, right knee: Secondary | ICD-10-CM | POA: Diagnosis not present

## 2023-11-08 DIAGNOSIS — S76111A Strain of right quadriceps muscle, fascia and tendon, initial encounter: Secondary | ICD-10-CM | POA: Diagnosis not present

## 2023-12-03 DIAGNOSIS — R69 Illness, unspecified: Secondary | ICD-10-CM | POA: Diagnosis not present

## 2023-12-26 DIAGNOSIS — J45909 Unspecified asthma, uncomplicated: Secondary | ICD-10-CM | POA: Diagnosis not present

## 2023-12-26 DIAGNOSIS — K2900 Acute gastritis without bleeding: Secondary | ICD-10-CM | POA: Diagnosis not present

## 2023-12-28 DIAGNOSIS — R059 Cough, unspecified: Secondary | ICD-10-CM | POA: Diagnosis not present

## 2023-12-28 DIAGNOSIS — R110 Nausea: Secondary | ICD-10-CM | POA: Diagnosis not present

## 2024-03-18 ENCOUNTER — Telehealth: Payer: Self-pay | Admitting: Gastroenterology

## 2024-03-19 NOTE — Telephone Encounter (Signed)
 Called patient.  No answer.

## 2024-03-19 NOTE — Telephone Encounter (Signed)
 Received call from answering service. Called patient back this evening. Patient has history of issues of nausea and vomiting and belching with GERD. Has been on PPI 40 mg once daily in the morning and famotidine  20 mg at night. Last 3 days has had progressive issues with nausea and vomiting and shaking, which she states he has told Dr. Nandigam about in the past in which his symptoms have always been similar. No fevers or chills. No one else is sick at home at this time. No changes in bowel habits or blood in his stools has been noted. He has not had any hematemesis GIST bilious vomitus. I recommended that he increase his PPI up to 40 mg twice daily for the next 2 weeks. He may continue famotidine  20 mg at night. He has Zofran  4 mg tablets, and he may use up to 8 mg every 8 hours or continue 4 mg every 6 hours if needed. I told him that our team would reach out to him in the morning, to find out how he is doing. If he still having issues, he may need lab work as well as imaging to be considered. Will forward this to Dr. Nandigam and our [facility] nurses. He agrees with this plan of action. He is appreciative for the call back.  Aloha Finner, MD [facility] Office # [phone]

## 2024-03-19 NOTE — Telephone Encounter (Signed)
 Patient reports improvement. No further retching. He has had his morning coffee. He took Compazine that he had. Tells me the symptoms are something that he has periodically and has for several years. He has not eaten any solid foods yet.  He has increased the pantoprazole  to BID. An appointment for follow up is scheduled for January.  We will speak again this afternoon to be certain he has continued to improve. Dr Shila is aware.

## 2024-03-20 NOTE — Telephone Encounter (Signed)
 Patient reports he has not had any further symptoms.

## 2024-03-25 ENCOUNTER — Telehealth: Payer: Self-pay | Admitting: Gastroenterology

## 2024-03-25 NOTE — Telephone Encounter (Signed)
 Spoke w pt about medication adjustment for Pantoprazole  and famotidine . Pt requesting advice about dosing. Please advise.

## 2024-03-25 NOTE — Telephone Encounter (Signed)
 Spoke with the patient. He is doing well and has not had any further spells of retching. He is inquiring on how the provider wants him to take his medications.  He has been taking famotidine  40 mg every morning and famotidine  20 mg at hs. He does not have pantoprazole  and does not recognize the name of this medication. The patient did not increase the famotidine  after speaking with Dr Wilhelmenia during the previous retching spell. The patient also wants Dr Shila to be aware the ondansetron  did not alleviate his retching and he had to take prochlorperazine, which he had gotten from [place].

## 2024-03-28 NOTE — Telephone Encounter (Signed)
 Patient advised per Dr Nandigam to continue taking his medications as he has been taking them. If he were to need more Compazine (prochlorperazine) he is to call us . Per Dr Shila he can have an additional 10 tablets.

## 2024-04-23 ENCOUNTER — Ambulatory Visit: Admitting: Gastroenterology

## 2024-04-24 NOTE — Progress Notes (Signed)
 "  Chief Complaint: GERD Primary GI MD: Dr. Shila  HPI: Discussed the use of AI scribe software for clinical note transcription with the patient, who gave verbal consent to proceed.  History of Present Illness   Zachary Sanchez is a 79 year old male with gastroesophageal reflux disease who presents for evaluation of recurrent severe reflux attacks.  He experiences periodic episodes of severe acid reflux, characterized by sudden onset of heavy salivation, retching, and nausea. Attacks are sporadic, with the most recent and severe episode occurring three to four weeks ago, lasting longer than usual and requiring bed rest due to persistent nausea. No clear dietary or stress-related triggers identified, and no change in appetite before or after attacks. He does not use NSAIDs or ibuprofen regularly.  Symptoms were previously well controlled with pantoprazole  40 mg in the morning and famotidine  20 mg at bedtime, but the most recent episode was refractory to this regimen. During the attack, he contacted the night physician and was advised to take an additional dose, but his regimen was not changed. He has used ondansetron  in the past without benefit, while promethazine  has been more helpful for nausea.  Initial workup included evaluation for gallbladder disease, leading to cholecystectomy without symptom improvement. Subsequent hernia repair also did not resolve symptoms. Symptoms improved with acid suppression therapy.  Fatty liver was identified on CT scan in 2022. A liver enzyme was elevated in January 2024, with regular lab monitoring ongoing   PREVIOUS GI WORKUP   CT Abdomen Pelvis w contrast 12-03-20 1. No acute findings identified within the abdomen or pelvis. 2. There is a rather elongated cecum which extends into the ventral pelvis, across the midline of the abdomen and into a large left inguinal hernia. There are no signs of bowel obstruction at this time. 3. Suspect nonobstructing  small bowel involving the ascending mesocolon. This contains nondilated loops of small bowel. 4. Small fat containing supraumbilical hernia is identified measuring 3 cm. 5. Nonobstructing left renal calculus. 6. Subjective hepatic steatosis. 7. Aortic atherosclerosis.   US  Abdomen 05-19-20 1.  Suggestion of hepatic steatosis.  2.  Multiple small echogenic foci within the gallbladder the gallbladder wall which may represent cholelithiasis and/or gallbladder wall polyps, which do not require specific follow-up imaging per current ACR guidelines. No overt cholecystitis.  3.  Mildly complex cyst of the interpolar region of the right kidney.    EGD in 2017 for duodenal ulcer and duodenitis, H. pylori positive was treated with Pylera .  H. pylori was eradicated Subsequent EGD 06-19-20:Normal exam    Colonoscopy 07-08-19: History of colon polyps Report not available in epic, was performed by Dr. Luis, no recall due to age and no polyps were found or removed during that exam.  Past Medical History:  Diagnosis Date   Aortic root enlargement    aortic root 40mm in size   Atrial flutter (HCC)    Biatrial enlargement    Colonic polyp    DJD (degenerative joint disease)    DVT (deep venous thrombosis) (HCC) 03/1016   post knee surgery, on eliquis    Dysrhythmia    a fib   Gout    Heart murmur    when younger   History of benign prostatic hypertrophy    Hyperlipidemia    Persistent atrial fibrillation (HCC)    longstanding persistent, asymptomatic   Pneumonia    Presence of Watchman left atrial appendage closure device 07/21/2022   35mm Watchman device by Dr. Cindie   Prostate  cancer [facility])    Pulmonary emboli (HCC) 03/2016   from DVT post knee surgery   Rupture of quadriceps tendon 05/10/2017   Wears glasses     Past Surgical History:  Procedure Laterality Date   FRACTURE SURGERY     ankles and fingers   INGUINAL HERNIA REPAIR Left 12/16/2020   Procedure: LAPAROSCOPIC LEFT AND   RIGHT INGUINAL HERNIA REPAIR; TAP BLOCKS;  Surgeon: Sheldon Standing, MD;  Location: [facility];  Service: General;  Laterality: Left;  GEN AND LOCAL   KNEE SURGERY     right; menicus tear    LAPAROSCOPIC CHOLECYSTECTOMY SINGLE SITE WITH INTRAOPERATIVE CHOLANGIOGRAM N/A 09/04/2020   Procedure: LAPAROSCOPIC CHOLECYSTECTOMY SINGLE SITE WITH INTRAOPERATIVE CHOLANGIOGRAM BILATERAL TAP BLOCK;  Surgeon: Sheldon Standing, MD;  Location: [facility];  Service: General;  Laterality: N/A;   LEFT ATRIAL APPENDAGE OCCLUSION N/A 07/21/2022   Procedure: LEFT ATRIAL APPENDAGE OCCLUSION;  Surgeon: Cindie Ole DASEN, MD;  Location: [facility] INVASIVE CV LAB;  Service: Cardiovascular;  Laterality: N/A;   LYMPHADENECTOMY Bilateral 07/09/2015   Procedure: PELVIC LYMPHADENECTOMY;  Surgeon: Gretel Ferrara, MD;  Location: [facility];  Service: Urology;  Laterality: Bilateral;   NASAL ENDOSCOPY WITH EPISTAXIS CONTROL N/A 05/03/2022   Procedure: NASAL ENDOSCOPY WITH EPISTAXIS CONTROL;  Surgeon: Jesus Oliphant, MD;  Location: [facility] OR;  Service: ENT;  Laterality: N/A;   QUADRICEPS REPAIR     ROBOT ASSISTED LAPAROSCOPIC RADICAL PROSTATECTOMY N/A 07/09/2015   Procedure: XI ROBOTIC ASSISTED LAPAROSCOPIC RADICAL PROSTATECTOMY LEVEL 2;  Surgeon: Gretel Ferrara, MD;  Location: [facility];  Service: Urology;  Laterality: N/A;   TEE WITHOUT CARDIOVERSION N/A 07/21/2022   Procedure: TRANSESOPHAGEAL ECHOCARDIOGRAM;  Surgeon: Cindie Ole DASEN, MD;  Location: [facility] INV[facility]IVE CV LAB;  Service: Cardiovascular;  Laterality: N/A;   TONSILLECTOMY     TOTAL KNEE ARTHROPLASTY Right 03/04/2016   Procedure: RIGHT TOTAL KNEE ARTHROPLASTY;  Surgeon: Lamar Collet, MD;  Location: [facility];  Service: Orthopedics;  Laterality: Right;    Current Outpatient Medications  Medication Sig Dispense Refill   allopurinol  (ZYLOPRIM ) 300 MG tablet Take 300 mg by mouth daily.     cholecalciferol (VITAMIN D ) 1000 units tablet Take 2,000 Units by mouth daily.     ezetimibe (ZETIA) 10 MG tablet Take 10  mg by mouth daily.     famotidine  (PEPCID ) 20 MG tablet Take 1 tablet (20 mg total) by mouth at bedtime. 90 tablet 3   famotidine  (PEPCID ) 40 MG tablet Take 1 tablet (40 mg total) by mouth at bedtime. 30 tablet 1   Glucosamine-Chondroitin (GLUCOSAMINE CHONDR COMPLEX PO) Take 2 tablets by mouth daily.     pantoprazole  (PROTONIX ) 40 MG tablet Take 1 tablet (40 mg total) by mouth 2 (two) times daily. 180 tablet 3   polycarbophil (FIBERCON) 625 MG tablet Take 625 mg by mouth daily.     rosuvastatin  (CRESTOR ) 40 MG tablet Take 40 mg by mouth daily.     traZODone (DESYREL) 50 MG tablet Take 100 mg by mouth at bedtime as needed.     zolpidem  (AMBIEN ) 10 MG tablet Take 5 mg by mouth at bedtime as needed for sleep.     No current facility-administered medications for this visit.    Allergies as of 04/25/2024   (No Known Allergies)    Family History  Problem Relation Age of Onset   Macular degeneration Mother    Heart attack Father    Alcohol abuse Father        Heavy smoker and drinker  Cancer Brother        base of tongue mets to brain   Diabetes Brother    Obesity Brother     Social History   Socioeconomic History   Marital status: Married    Spouse name: Not on file   Number of children: 2   Years of education: Not on file   Highest education level: Not on file  Occupational History   Occupation: Merchant [employer] Officer: [employer]  Tobacco Use   Smoking status: Former    Current packs/day: 0.00    Average packs/day: 1 pack/day for 20.0 years (20.0 ttl pk-yrs)    Types: Cigarettes    Start date: 02/11/1959    Quit date: 02/11/1979    Years since quitting: 45.2   Smokeless tobacco: Never  Vaping Use   Vaping status: Never Used  Substance and Sexual Activity   Alcohol use: Yes    Alcohol/week: 21.0 standard drinks of alcohol    Types: 21 Glasses of wine per week    Comment: 4 per day   Drug use: No   Sexual activity: Not Currently  Other Topics Concern    Not on file  Social History Narrative   Not on file   Social Drivers of Health   Tobacco Use: Medium Risk (04/25/2024)   Patient History    Smoking Tobacco Use: Former    Smokeless Tobacco Use: Never    Passive Exposure: Not on Actuary Strain: Not on file  Food Insecurity: Low Risk (06/30/2022)   Received from [facility]   Epic    Within the past 12 months, you worried that your food would run out before you got money to buy more: Never true    Within the past 12 months, the food you bought just didn't last and you didn't have money to get more. : Never true  Transportation Needs: Not on file (06/30/2022)  Physical Activity: Not on file  Stress: Not on file  Social Connections: Not on file  Intimate Partner Violence: Not on file  Depression (EYV7-0): Not on file  Alcohol Screen: Not on file  Housing: Low Risk (06/30/2022)   Received from [facility]   Epic    What is your living situation today?: I have a steady place to live    Think about the place you live. Do you have problems with any of the following? Choose all that apply:: Not on file  Utilities: Low Risk (06/30/2022)   Received from [facility]   Utilities    In the past 12 months has the electric, gas, oil, or water  company threatened to shut off services in your home? : No  Health Literacy: Not on file    Review of Systems:    Constitutional: No weight loss, fever, chills, weakness or fatigue HEENT: Eyes: No change in vision               Ears, Nose, Throat:  No change in hearing or congestion Skin: No rash or itching Cardiovascular: No chest pain, chest pressure or palpitations   Respiratory: No SOB or cough Gastrointestinal: See HPI and otherwise negative Genitourinary: No dysuria or change in urinary frequency Neurological: No headache, dizziness or syncope Musculoskeletal: No new muscle or joint pain Hematologic: No bleeding or bruising Psychiatric: No history of depression or  anxiety    Physical Exam:  Vital signs: BP 136/78 (BP Location: Left Arm, Patient Position: Sitting, Cuff Size: Normal)  Pulse 73   Ht 6' 2 (1.88 m)   Wt 224 lb 4 oz (101.7 kg)   BMI 28.79 kg/m   Constitutional: NAD, alert and cooperative Head:  Normocephalic and atraumatic. Eyes:   PEERL, EOMI. No icterus. Conjunctiva pink. Respiratory: Respirations even and unlabored. Lungs clear to auscultation bilaterally.   No wheezes, crackles, or rhonchi.  Cardiovascular:  Regular rate and rhythm. No peripheral edema, cyanosis or pallor.  Gastrointestinal:  Soft, nondistended, nontender. No rebound or guarding. Normal bowel sounds. No appreciable masses or hepatomegaly. Rectal:  Declines Msk:  Symmetrical without gross deformities. Without edema, no deformity or joint abnormality.  Neurologic:  Alert and  oriented x4;  grossly normal neurologically.  Skin:   Dry and intact without significant lesions or rashes. Psychiatric: Oriented to person, place and time. Demonstrates good judgement and reason without abnormal affect or behaviors.  Physical Exam    RELEVANT LABS AND IMAGING: CBC    Component Value Date/Time   WBC 8.0 06/29/2022 1536   WBC 9.4 05/12/2022 0033   RBC 4.01 (L) 06/29/2022 1536   RBC 4.27 05/12/2022 0033   HGB 13.9 06/29/2022 1536   HCT 41.7 06/29/2022 1536   PLT 215 06/29/2022 1536   MCV 104 (H) 06/29/2022 1536   MCH 34.7 (H) 06/29/2022 1536   MCH 34.7 (H) 05/12/2022 0033   MCHC 33.3 06/29/2022 1536   MCHC 35.5 05/12/2022 0033   RDW 12.6 06/29/2022 1536   LYMPHSABS 2.0 06/29/2022 1536   MONOABS 1.9 (H) 05/12/2022 0033   EOSABS 0.0 06/29/2022 1536   BASOSABS 0.0 06/29/2022 1536    CMP     Component Value Date/Time   NA 140 08/24/2022 1006   K 4.4 08/24/2022 1006   CL 102 08/24/2022 1006   CO2 22 08/24/2022 1006   GLUCOSE 97 08/24/2022 1006   GLUCOSE 99 05/12/2022 0033   BUN 9 08/24/2022 1006   CREATININE 0.78 08/24/2022 1006   CALCIUM  9.9  08/24/2022 1006   PROT 7.8 05/12/2022 0033   ALBUMIN 4.1 05/12/2022 0033   AST 38 05/12/2022 0033   ALT 45 (H) 05/12/2022 0033   ALKPHOS 94 05/12/2022 0033   BILITOT 0.6 05/12/2022 0033   GFRNONAA >60 05/12/2022 0033   GFRAA >60 03/25/2016 0223     Assessment/Plan:   Elevated LFTs ALT 05/12/2022 45, otherwise normal remaining labs..  Has not been rechecked since.  CTAP with contrast 11/2020 with hepatic steatosis. Reported recent normal LFTs with PCP -- continue to monitor with PCP  GERD On pantoprazole  40 mg daily and Pepcid  20 mg as needed with recent flare up around the holidays - Continued pantoprazole  40 mg in the morning. - Provided guidance to use additional famotidine  (up to 40 mg) and antacids as needed during exacerbations. - Advised to maintain symptom diary during episodes, documenting dietary intake, meal timing, stress, and potential triggers, and communicate to clinic. - Prescribed promethazine  as rescue antiemetic as it works well for him in the past. - Provided dietary counseling to avoid acidic foods, tomatoes, tomato sauce, caffeine, spicy foods, and citrus. - Scheduled gastroenterology follow-up in 6-8 weeks.  Chronic constipation On Benefiber daily and Colace as needed  History of colon polyps Last colonoscopy 06/2019 by Dr. Luis.  No recall due to age and no polyps were found removed during last exam.  A-fib S/p Watchman device 2024 following with Dr. Ellyn Rakers  Prostate cancer DVT     Terryl Niziolek Mollie RIGGERS [facility] 04/25/2024, 11:23 AM  Cc: Blomgren,  Maude, MD "

## 2024-04-25 ENCOUNTER — Ambulatory Visit: Admitting: Gastroenterology

## 2024-04-25 ENCOUNTER — Encounter: Payer: Self-pay | Admitting: Gastroenterology

## 2024-04-25 ENCOUNTER — Telehealth: Payer: Self-pay | Admitting: Gastroenterology

## 2024-04-25 VITALS — BP 136/78 | HR 73 | Ht 74.0 in | Wt 224.2 lb

## 2024-04-25 DIAGNOSIS — R112 Nausea with vomiting, unspecified: Secondary | ICD-10-CM

## 2024-04-25 DIAGNOSIS — C61 Malignant neoplasm of prostate: Secondary | ICD-10-CM

## 2024-04-25 DIAGNOSIS — I4891 Unspecified atrial fibrillation: Secondary | ICD-10-CM | POA: Diagnosis not present

## 2024-04-25 DIAGNOSIS — R7989 Other specified abnormal findings of blood chemistry: Secondary | ICD-10-CM

## 2024-04-25 DIAGNOSIS — K2100 Gastro-esophageal reflux disease with esophagitis, without bleeding: Secondary | ICD-10-CM

## 2024-04-25 DIAGNOSIS — Z95818 Presence of other cardiac implants and grafts: Secondary | ICD-10-CM

## 2024-04-25 DIAGNOSIS — Z860100 Personal history of colon polyps, unspecified: Secondary | ICD-10-CM

## 2024-04-25 DIAGNOSIS — K5904 Chronic idiopathic constipation: Secondary | ICD-10-CM | POA: Diagnosis not present

## 2024-04-25 DIAGNOSIS — R748 Abnormal levels of other serum enzymes: Secondary | ICD-10-CM

## 2024-04-25 DIAGNOSIS — Z8719 Personal history of other diseases of the digestive system: Secondary | ICD-10-CM

## 2024-04-25 DIAGNOSIS — K219 Gastro-esophageal reflux disease without esophagitis: Secondary | ICD-10-CM

## 2024-04-25 DIAGNOSIS — Z9049 Acquired absence of other specified parts of digestive tract: Secondary | ICD-10-CM

## 2024-04-25 MED ORDER — PROCHLORPERAZINE MALEATE 10 MG PO TABS: 10.0000 mg | ORAL_TABLET | Freq: Four times a day (QID) | ORAL | 0 refills | Status: DC | PRN

## 2024-04-25 MED ORDER — PROCHLORPERAZINE MALEATE 10 MG PO TABS: 10.0000 mg | ORAL_TABLET | Freq: Four times a day (QID) | ORAL | 0 refills | Status: AC | PRN

## 2024-04-25 MED ORDER — FAMOTIDINE 40 MG PO TABS: 40.0000 mg | ORAL_TABLET | Freq: Every day | ORAL | 1 refills | Status: AC

## 2024-04-25 NOTE — Telephone Encounter (Signed)
 I looked at [facility] note and saw that she prescribed Compazine .  Pharmacy questioned the amount - I told them it was #30 with 0 refills

## 2024-04-25 NOTE — Telephone Encounter (Signed)
 Inbound call from patient pharmacy stating that they need to verify the quantity of pills due to them not understanding. Good call back number is [phone] ask for [facility]. Please advise.

## 2024-04-25 NOTE — Patient Instructions (Signed)
 We have sent the following medications to your pharmacy for you to pick up at your convenience: Famotidine .  _______________________________________________________  If your blood pressure at your visit was 140/90 or greater, please contact your primary care physician to follow up on this.  _______________________________________________________  If you are age 79 or older, your body mass index should be between 23-30. Your Body mass index is 28.79 kg/m. If this is out of the aforementioned range listed, please consider follow up with your Primary Care Provider.  If you are age 17 or younger, your body mass index should be between 19-25. Your Body mass index is 28.79 kg/m. If this is out of the aformentioned range listed, please consider follow up with your Primary Care Provider.   ________________________________________________________  The [facility] providers would like to encourage you to use MYCHART to communicate with providers for non-urgent requests or questions.  Due to long hold times on the telephone, sending your provider a message by [facility] may be a faster and more efficient way to get a response.  Please allow 48 business hours for a response.  Please remember that this is for non-urgent requests.  _______________________________________________________  [facility] is using a team-based approach to care.  Your team is made up of your doctor and two to three APPS. Our APPS (Nurse Practitioners and Physician Assistants) work with your physician to ensure care continuity for you. They are fully qualified to address your health concerns and develop a treatment plan. They communicate directly with your gastroenterologist to care for you. Seeing the Advanced Practice Practitioners on your physician's team can help you by facilitating care more promptly, often allowing for earlier appointments, access to diagnostic testing, procedures, and other specialty referrals.

## 2024-04-28 NOTE — Progress Notes (Unsigned)
" °  Electrophysiology Office Note:    Date:  04/29/2024   ID:  RADWAN COWLEY, DOB 09-11-1945, MRN [id number]  PCP:  Windy Coy, MD   [facility] Providers Cardiologist:  None Electrophysiologist:  OLE ONEIDA HOLTS, MD (Inactive)     Referring MD: Windy Coy, MD   History of Present Illness:    Zachary Sanchez is a 79 y.o. male with a medical history significant for permanent atrial fibrillation, status post Watchman, here for electrophysiology follow-up.        History of Present Illness He has been in atrial fibrillation for years, and attempts at rhythm control have been abandoned.  He underwent successful left atrial appendage occlusion with a 35 mm watchman flex on Sep 04, 2022.  Cardiac CT on June 12 showed the device was fully endothelialized and there is no leak.         Today, he reports he feels well and has no acute complaints.  EKGs/Labs/Other Studies Reviewed Today:     Echocardiogram:  TEE 07/21/2022 LVEF 60 to 65%.  Large PFO noted 35 mm Watchman Flex deployed moderately dilated left atrium mild to moderate MR.     EKG:   EKG Interpretation Date/Time:  Monday April 29 2024 10:26:22 EST Ventricular Rate:  50 PR Interval:    QRS Duration:  90 QT Interval:  440 QTC Calculation: 401 R Axis:   58  Text Interpretation: Atrial fibrillation with slow ventricular response with premature ventricular or aberrantly conducted complexes Septal infarct (cited on or before 21-Jul-2022) When compared with ECG of 29-Apr-2024 10:26, No significant change was found Confirmed by Nancey Scotts [phone]) on 04/29/2024 10:40:49 AM     Physical Exam:    VS:  BP 128/78 (BP Location: Right Arm, Patient Position: Sitting, Cuff Size: Large)   Pulse (!) 50   Ht 6' 2 (1.88 m)   Wt 221 lb (100.2 kg)   SpO2 98%   BMI 28.37 kg/m     Wt Readings from Last 3 Encounters:  04/29/24 221 lb (100.2 kg)  04/25/24 224 lb 4 oz (101.7 kg)  08/18/23 220 lb (99.8 kg)      GEN: Well nourished, well developed in no acute distress CARDIAC: RRR, no murmurs, rubs, gallops RESPIRATORY:  Normal work of breathing MUSCULOSKELETAL: no edema    ASSESSMENT & PLAN:     Permanent atrial fibrillation Patient reports minimal symptoms Rates are controlled  Secondary hypercoagulable state S/p Watchman  At this time, since there are no plans for rhythm control, I do not see any need for further electrophysiology follow-up.  He may continue to follow-up with general cardiology, and I will be happy to see him back in electrophysiology clinic if new issues arise.    Signed, Scotts FORBES Nancey, MD  04/29/2024 10:49 AM    [facility] "

## 2024-04-29 ENCOUNTER — Encounter: Payer: Self-pay | Admitting: Cardiovascular Disease

## 2024-04-29 ENCOUNTER — Ambulatory Visit: Attending: Cardiovascular Disease | Admitting: Cardiovascular Disease

## 2024-04-29 DIAGNOSIS — I4821 Permanent atrial fibrillation: Secondary | ICD-10-CM

## 2024-04-29 DIAGNOSIS — Z95818 Presence of other cardiac implants and grafts: Secondary | ICD-10-CM

## 2024-04-29 NOTE — Patient Instructions (Addendum)
 Medication Instructions:  Your physician recommends that you continue on your current medications as directed. Please refer to the Current Medication list given to you today.  *If you need a refill on your cardiac medications before your next appointment, please call your pharmacy*  Lab Work: None ordered.  If you have labs (blood work) drawn today and your tests are completely normal, you will receive your results only by: MyChart Message (if you have MyChart) OR A paper copy in the mail If you have any lab test that is abnormal or we need to change your treatment, we will call you to review the results.  Testing/Procedures: None ordered.   Follow-Up: At [facility], you and your health needs are our priority.  As part of our continuing mission to provide you with exceptional heart care, our providers are all part of one team.  This team includes your primary Cardiologist (physician) and Advanced Practice Providers or APPs (Physician Assistants and Nurse Practitioners) who all work together to provide you with the care you need, when you need it.  Your next appointment:   Please follow up with Dr Nancey as needed  Please schedule appointment with general cardiology - Dr Delford

## 2024-06-07 ENCOUNTER — Ambulatory Visit: Admitting: Cardiovascular Disease

## 2024-06-27 ENCOUNTER — Ambulatory Visit: Admitting: Gastroenterology
